# Patient Record
Sex: Male | Born: 1965
Health system: Southern US, Community
[De-identification: ages and names within clinical notes are randomized; demographics above are authoritative.]

## PROBLEM LIST (undated history)

## (undated) DIAGNOSIS — H9190 Unspecified hearing loss, unspecified ear: Secondary | ICD-10-CM

## (undated) DIAGNOSIS — F1010 Alcohol abuse, uncomplicated: Secondary | ICD-10-CM

## (undated) DIAGNOSIS — T7840XA Allergy, unspecified, initial encounter: Secondary | ICD-10-CM

## (undated) DIAGNOSIS — R519 Headache, unspecified: Secondary | ICD-10-CM

## (undated) DIAGNOSIS — M1990 Unspecified osteoarthritis, unspecified site: Secondary | ICD-10-CM

## (undated) DIAGNOSIS — R42 Dizziness and giddiness: Secondary | ICD-10-CM

## (undated) DIAGNOSIS — R51 Headache: Secondary | ICD-10-CM

## (undated) DIAGNOSIS — E785 Hyperlipidemia, unspecified: Secondary | ICD-10-CM

## (undated) HISTORY — DX: Headache: R51

## (undated) HISTORY — PX: SHOULDER SURGERY: SHX246

## (undated) HISTORY — DX: Unspecified osteoarthritis, unspecified site: M19.90

## (undated) HISTORY — PX: COLONOSCOPY: SHX174

## (undated) HISTORY — DX: Allergy, unspecified, initial encounter: T78.40XA

## (undated) HISTORY — DX: Dizziness and giddiness: R42

## (undated) HISTORY — DX: Unspecified hearing loss, unspecified ear: H91.90

## (undated) HISTORY — DX: Hyperlipidemia, unspecified: E78.5

## (undated) HISTORY — PX: INNER EAR SURGERY: SHX679

## (undated) HISTORY — PX: OTHER SURGICAL HISTORY: SHX169

## (undated) HISTORY — DX: Headache, unspecified: R51.9

---

## 2002-02-01 ENCOUNTER — Emergency Department (HOSPITAL_COMMUNITY): Admission: EM | Admit: 2002-02-01 | Discharge: 2002-02-01 | Payer: Self-pay | Admitting: Emergency Medicine

## 2005-04-25 ENCOUNTER — Emergency Department (HOSPITAL_COMMUNITY): Admission: EM | Admit: 2005-04-25 | Discharge: 2005-04-25 | Payer: Self-pay | Admitting: Emergency Medicine

## 2005-04-26 ENCOUNTER — Observation Stay (HOSPITAL_COMMUNITY): Admission: EM | Admit: 2005-04-26 | Discharge: 2005-04-27 | Payer: Self-pay | Admitting: Emergency Medicine

## 2009-07-01 ENCOUNTER — Ambulatory Visit: Payer: Self-pay | Admitting: Cardiology

## 2009-07-01 ENCOUNTER — Observation Stay (HOSPITAL_COMMUNITY): Admission: EM | Admit: 2009-07-01 | Discharge: 2009-07-02 | Payer: Self-pay | Admitting: Emergency Medicine

## 2009-07-12 ENCOUNTER — Encounter: Payer: Self-pay | Admitting: Physician Assistant

## 2009-07-12 ENCOUNTER — Ambulatory Visit: Payer: Self-pay | Admitting: Internal Medicine

## 2009-07-12 DIAGNOSIS — L723 Sebaceous cyst: Secondary | ICD-10-CM | POA: Insufficient documentation

## 2009-07-12 DIAGNOSIS — H6120 Impacted cerumen, unspecified ear: Secondary | ICD-10-CM | POA: Insufficient documentation

## 2009-07-12 DIAGNOSIS — H9190 Unspecified hearing loss, unspecified ear: Secondary | ICD-10-CM | POA: Insufficient documentation

## 2009-07-12 DIAGNOSIS — K219 Gastro-esophageal reflux disease without esophagitis: Secondary | ICD-10-CM | POA: Insufficient documentation

## 2009-08-01 ENCOUNTER — Ambulatory Visit: Payer: Self-pay | Admitting: Physician Assistant

## 2009-08-01 ENCOUNTER — Telehealth: Payer: Self-pay | Admitting: Physician Assistant

## 2009-08-01 DIAGNOSIS — H9209 Otalgia, unspecified ear: Secondary | ICD-10-CM | POA: Insufficient documentation

## 2009-08-18 ENCOUNTER — Ambulatory Visit: Payer: Self-pay | Admitting: Physician Assistant

## 2009-10-31 ENCOUNTER — Ambulatory Visit: Payer: Self-pay | Admitting: Internal Medicine

## 2009-10-31 ENCOUNTER — Ambulatory Visit (HOSPITAL_COMMUNITY): Admission: RE | Admit: 2009-10-31 | Discharge: 2009-10-31 | Payer: Self-pay | Admitting: Internal Medicine

## 2009-10-31 DIAGNOSIS — R1013 Epigastric pain: Secondary | ICD-10-CM | POA: Insufficient documentation

## 2009-10-31 LAB — CONVERTED CEMR LAB
ALT: 9 units/L (ref 0–53)
Amylase: 31 units/L (ref 0–105)
BUN: 12 mg/dL (ref 6–23)
CO2: 22 meq/L (ref 19–32)
Calcium: 9.2 mg/dL (ref 8.4–10.5)
Chloride: 104 meq/L (ref 96–112)
Creatinine, Ser: 0.85 mg/dL (ref 0.40–1.50)
Eosinophils Absolute: 0.1 10*3/uL (ref 0.0–0.7)
Eosinophils Relative: 1 % (ref 0–5)
HCT: 46.5 % (ref 39.0–52.0)
Lymphs Abs: 2.6 10*3/uL (ref 0.7–4.0)
MCV: 93.4 fL (ref 78.0–100.0)
Monocytes Absolute: 0.5 10*3/uL (ref 0.1–1.0)
Monocytes Relative: 8 % (ref 3–12)
RBC: 4.98 M/uL (ref 4.22–5.81)
WBC: 5.8 10*3/uL (ref 4.0–10.5)

## 2009-11-01 ENCOUNTER — Telehealth: Payer: Self-pay | Admitting: Physician Assistant

## 2010-04-17 ENCOUNTER — Emergency Department (HOSPITAL_COMMUNITY)
Admission: EM | Admit: 2010-04-17 | Discharge: 2010-04-17 | Disposition: A | Discharge status: Other Acute Inpatient Hospital | Payer: Self-pay | Source: Home / Self Care | Admitting: Emergency Medicine

## 2010-04-17 ENCOUNTER — Emergency Department (HOSPITAL_COMMUNITY)
Admission: EM | Admit: 2010-04-17 | Discharge: 2010-04-17 | Payer: Self-pay | Source: Home / Self Care | Admitting: Emergency Medicine

## 2010-06-15 NOTE — Assessment & Plan Note (Signed)
Summary: Office Visit:  Ear pain  Nurse Visit   History of Present Illness: Walked into clinic this morning with bilat ear pain.  Has a long h/o ear problems.  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  He is hard of hearing.  Had ears irrigated and wax removed today.  Pain in both ears like he has had in past with "infections."  No fever.  No cough.  Has not tried anything for it.  No improvement with ear irrigation today.   Physical Exam  General:  alert, well-developed, and well-nourished.   Head:  normocephalic and atraumatic.   Eyes:  pupils equal, pupils round, and pupils reactive to light.   Ears:  bilat TM's with obvious perf and chronic scarring no purulent drainage mild irritation of the canals bilat; likely 2/2 ear irrigation today Nose:  no external deformity.   Mouth:  pharynx pink and moist.   Neck:  supple and no cervical lymphadenopathy.   Lungs:  normal breath sounds.   Heart:  normal rate and regular rhythm.     Impression & Recommendations:  Problem # 1:  OTALGIA (ICD-388.70) will give floxin otic to cover for otitis externa return as needed  His updated medication list for this problem includes:    Debrox 6.5 % Soln (Carbamide peroxide) .Marland KitchenMarland KitchenMarland KitchenMarland Kitchen 5 drops each ear two times a day for 4 days  Complete Medication List: 1)  Debrox 6.5 % Soln (Carbamide peroxide) .... 5 drops each ear two times a day for 4 days 2)  Prilosec 20 Mg Cpdr (Omeprazole) .... Take 1 capsule by mouth once a day 3)  Ocuflox 0.3 % Soln (Ofloxacin) .[name].. 10 gtts in both ears once daily for 7 days   Orders Added: 1)  Est. Patient Level III [47829]  Patient Instructions: 1)  Please schedule a follow-up appointment as needed .   Prescriptions: OCUFLOX 0.3 % SOLN (OFLOXACIN) 10 gtts in both ears once daily for 7 days  #1 bottle x 0   Entered and Authorized by:   Tereso Newcomer PA-C   Signed by:   Tereso Newcomer PA-C on 08/01/2009   Method used:   Print then Give to Patient   RxID:   [id number]   Appended Document: Office Visit:  Ear pain    Clinical Lists Changes  Medications: Removed medication of OCUFLOX 0.3 % SOLN (OFLOXACIN) 10 gtts in both ears once daily for 7 days Added new medication of OFLOXACIN 0.3 % SOLN (OFLOXACIN) 10 drops in both ears once daily for 7 days - Signed Rx of OFLOXACIN 0.3 % SOLN (OFLOXACIN) 10 drops in both ears once daily for 7 days;  #1 bottle x 0;  Signed;  Entered by: Tereso Newcomer PA-C;  Authorized by: Tereso Newcomer PA-C;  Method used: Print then Give to Patient    Prescriptions: OFLOXACIN 0.3 % SOLN (OFLOXACIN) 10 drops in both ears once daily for 7 days  #1 bottle x 0   Entered and Authorized by:   Tereso Newcomer PA-C   Signed by:   Tereso Newcomer PA-C on 08/01/2009   Method used:   Print then Give to Patient   RxID:   [id number]

## 2010-06-15 NOTE — Progress Notes (Signed)
Summary: X-rays results  Phone Note Call from Patient Call back at [phone]   Summary of Call: Mr. Vetsch is requesting from his x-rays results.  Please call him back. Avelynn Sellin PA-c Initial call taken by: Manon Hilding,  November 01, 2009 8:12 AM  Follow-up for Phone Call        [place], Looks normal.  Wanted to check with you first since you saw him. Follow-up by: Tereso Newcomer PA-C,  November 01, 2009 1:44 PM  Additional Follow-up for Phone Call Additional follow up Details #1::        Called phone--was his work phone and he is gone for the day.  Unable to reach him via listed home phone--"incorrect code number".   Please call him at [phone] number in morning --ask for Ray--apparently he goes by that--let him know all of his lab work and Xray was normal. See if his pain is improved. Additional Follow-up by: Julieanne Manson MD,  November 01, 2009 6:05 PM    Additional Follow-up for Phone Call Additional follow up Details #2::    pt says he still have pain. [facility] [address]... [place] Shannon  November 02, 2009 9:10 AM

## 2010-06-15 NOTE — Assessment & Plan Note (Signed)
Summary: [facility] pt's / stomach pain//gk   Vital Signs:  Patient profile:   45 year old male Weight:      171 pounds Temp:     97.3 degrees F Pulse rate:   79 / minute Pulse rhythm:   regular Resp:     20 per minute BP sitting:   128 / 77  (left arm) Cuff size:   regular  Vitals Entered By: Vesta Mixer CMA (October 31, 2009 11:53 AM) CC: stomach pain since Saturday.  Is also having almost constant hiccups since then as well Is Patient Diabetic? No Pain Assessment Patient in pain? yes     Location: stomach Intensity: 8  Does patient need assistance? Ambulation Normal   Primary Care Provider:  Tereso Newcomer, PA-C  CC:  stomach pain since Saturday.  Is also having almost constant hiccups since then as well.  History of Present Illness: 1.  Nauseated for a couple of days, then developed epigastric pain, like being pulled apart on the inside.  Some sharp and burning pain.  No radiation of pain to back or elsewhere.  Started hiccuping yesterday, but pt. belched at one point during the interview and stated that was a hiccup.  Bad tast in mouth with the belching.  No fever, diarrhea, constipation, melena or hematochezia.  Did vomit 3-4 times yesterday.  Has not eaten for 2 days.  Has been drinking [place] Sweet Tea and water.  Has been urinating okay--urine a bit darker today.  Has not had any alcohol since hospitalized in February with chest pain--felt to have GERD or possibly gastritis then.  Does smoke 1 ppd.  Cannot say he ate anything differently before symptoms occured--no increased chocolate, onions, tomatoes, caffeine, etc.  Pt. was switched to H2 blocker in April, but pt. went back to using OMeprazole as did not control reflux symptoms--felt better for a while until 2 days ago.    Allergies (verified): 1)  ! Codeine  Physical Exam  General:  Fatigued appearing. Lungs:  Normal respiratory effort, chest expands symmetrically. Lungs are clear to auscultation, no crackles or  wheezes. Heart:  Normal rate and regular rhythm. S1 and S2 normal without gallop, murmur, click, rub or other extra sounds. Abdomen:  soft.  Tender without peritoneal signs in epigastrium and around umbilicus as well as slight tenderness in RLQ--the latter seemed to cause him more discomfort, however in epigastrium.  No rebound.  No HSM or mass.   Impression & Recommendations:  Problem # 1:  EPIGASTRIC PAIN (ICD-789.06) Clear liquids, no caffeine, no NSAIDS Increase Omeprazole to 40 mg two times a day  Phenergain for nausea as needed   Orders: T-Comprehensive Metabolic Panel (04540-98119) T-CBC w/Diff (14782-95621) TLB-H. Pylori Abs(Helicobacter Pylori) (86677-HELICO) T-Amylase (30865-78469) T-Lipase (62952-84132) Diagnostic X-Ray/Fluoroscopy (Diagnostic X-Ray/Flu)--Acute abdominal series  Complete Medication List: 1)  Debrox 6.5 % Soln (Carbamide peroxide) .... 5 drops each ear two times a day for 4 days 2)  Omeprazole 40 Mg Cpdr (Omeprazole) .[name].. 1 cap by mouth two times a day on empty stomach 3)  Ofloxacin 0.3 % Soln (Ofloxacin) .[name].. 10 drops in both ears once daily for 7 days 4)  Promethazine Hcl 25 Mg Tabs (Promethazine hcl) .[name].. 1 tab by mouth every 6 hours as needed for nausea and vomiting  Patient Instructions: 1)  Will call with results Prescriptions: PROMETHAZINE HCL 25 MG TABS (PROMETHAZINE HCL) 1 tab by mouth every 6 hours as needed for nausea and vomiting  #10 x 0   Entered and  Authorized by:   Julieanne Manson MD   Signed by:   Julieanne Manson MD on 10/31/2009   Method used:   Electronically to        [facility] (retail)       [address]       [place], [place]  [id number]       Ph: [phone]       Fax: [phone]   RxID:   [id number] OMEPRAZOLE 40 MG CPDR (OMEPRAZOLE) 1 cap by mouth two times a day on empty stomach  #60 x 4   Entered and Authorized by:   Julieanne Manson MD   Signed by:   Julieanne Manson MD on  10/31/2009   Method used:   Electronically to        [facility] (retail)       [address]       [place], [place]  [id number]       Ph: [phone]       Fax: [phone]   RxID:   [id number]

## 2010-06-15 NOTE — Progress Notes (Signed)
Summary: pt wants to be seen.  Phone Note Call from Patient Call back at Home Phone [phone]   Summary of Call: Mr. Kenneth Gilbert has pain in both ears which unable for him to hear correctly and he wants to be seen promptly.  Pt is at the lobby at this moment. Alben Spittle PA-c  Initial call taken by: Manon Hilding,  August 01, 2009 9:54 AM  Follow-up for Phone Call        pt says he would like some ear med because his ear hurts.... pt says this started a couple of days ago.... pt says it is both ears... he says this must has happen since he was out in the wind... Follow-up by: [place] Shannon,  August 01, 2009 9:59 AM  Additional Follow-up for Phone Call Additional follow up Details #1::        Is he coming in for nurse visit? Additional Follow-up by: Tereso Newcomer PA-C,  August 01, 2009 1:37 PM    Additional Follow-up for Phone Call Additional follow up Details #2::    pt is here for nurse visit Follow-up by: [place] Shannon,  August 01, 2009 4:04 PM

## 2010-06-15 NOTE — Assessment & Plan Note (Signed)
Summary: XFU-CHEST PAIN//DS   Vital Signs:  Patient profile:   45 year old male Height:      70 inches Weight:      175 pounds BMI:     25.20 Temp:     98.0 degrees F oral Pulse rate:   74 / minute Pulse rhythm:   regular Resp:     18 per minute BP sitting:   120 / 64  (left arm) Cuff size:   regular  Vitals Entered By: [place] Shannon (July 12, 2009 11:51 AM) CC: xfu.... chest pains..... Is Patient Diabetic? No Pain Assessment Patient in pain? no       Does patient need assistance? Functional Status Self care Ambulation Normal   CC:  xfu.... chest pains......  History of Present Illness: New patient.  Post hosp f/u.  Had chest pain.  Ruled out.  ETT was neg.  No further w/u recommended.  Placed on PPI for probable GERD causing symptoms. Patient denies having any other medical care in recent past. He was asked to take prilosec which he got over the counter.  His heartburn is much better.  Symptoms have dramatically improved since he started the med.  He still feels some substernal and left chest discomfort at times.  Symptoms all related to meals.  No dysphagia.  No melena or hematochezia.  No vomiting or hematemesis.  Labs in the hospital ok.  No anemia.  No weight loss.    Habits & Providers  Alcohol-Tobacco-Diet     Tobacco Status: quit  Exercise-Depression-Behavior     Have you felt down or hopeless? yes     Have you felt little pleasure in things? no  Past History:  Past Medical History: Hard of Hearing GERD past cocaine use High Cholesterol in past  Past Surgical History: s/p right and left ulnar nerve release at [facility] 1999 s/p left shoulder surgery at [facility] 1999 s/p ear surgery 1967  Family History: CAD - s/p CABG (?age); Bro with MI/CVA Cancer - Dad (? colon) Cirrhosis - sister Family History Diabetes 1st degree relative - brother  Social History: Alcohol use-yes    a. states he quit when he went to the hospital 06/2009   b.  previously drinking  every day; 1/5 liquor per day during week and 1/2 gallon on weekends   c.  says he was drinking a lot due to increased stress at home Past cocaine use (none in 2 years) Admits to [facility] Former Smoker (20 pack years)   a.  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 CV:  Denies difficulty breathing at night, difficulty breathing while lying down, fainting, and near fainting. Resp:  Denies cough. GI:  Denies bloody stools and dark tarry stools. GU:  Denies hematuria. Psych:  Denies suicidal thoughts/plans.  Physical Exam  General:  alert, well-developed, and well-nourished.   Head:  normocephalic and atraumatic.   Eyes:  pupils equal, pupils round, and pupils reactive to light.   Ears:  R cerumen impaction, R decreased hearing, L Cerumen impaction, and L decreased hearing.   Nose:  no external deformity.   Mouth:  pharynx pink and moist.   Neck:  supple, no carotid bruits, and no cervical lymphadenopathy.   Lungs:  normal breath sounds, no crackles, and no wheezes.   Heart:  normal rate and regular rhythm.  Abdomen:  soft, non-tender, and no hepatomegaly.   Extremities:  no edema Neurologic:  alert & oriented X3 and cranial nerves II-XII intact.   Psych:  normally interactive, not anxious appearing, and not depressed appearing.     Impression & Recommendations:  Problem # 1:  GERD (ICD-530.81)  continue PPI f/u in one month try to get on H2RA at that time may need upper GI or referral to GI if symptoms persisit  His updated medication list for this problem includes:    Prilosec 20 Mg Cpdr (Omeprazole) .[name]... Take 1 capsule by mouth once a day  Problem # 2:  SEBACEOUS CYST, NECK (ICD-706.2) will see if Dr. Delrae Alfred can remove Dr. Delrae Alfred inspected and cannot remove if he wants removed will have to send to surgery or derm  Problem # 3:   Preventive Health Care (ICD-V70.0)  pt refused flu shot in hosp CMET, CBC ok in hosp Lipids:  HDL 33 and LDL 129 UDS with + THC in hosp check HIV today schedule CPE in future  Orders: T-HIV Antibody  (Reflex) (04540-98119)  Problem # 4:  IMPACTED CERUMEN (ICD-380.4) rx debrox if still there at f/u . [name] . irrigate  Complete Medication List: 1)  Debrox 6.5 % Soln (Carbamide peroxide) .... 5 drops each ear two times a day for 4 days 2)  Prilosec 20 Mg Cpdr (Omeprazole) .... Take 1 capsule by mouth once a day  Patient Instructions: 1)  Please schedule a follow-up appointment in 1 month with [facility] for your stomach. 2)  Keep taking the medicine every day until your follow up appt. 3)  The generic name is omeprazole and you can take 20 mg once daily.  Prescriptions: DEBROX 6.5 % SOLN (CARBAMIDE PEROXIDE) 5 drops each ear two times a day for 4 days  #1 bottle x 0   Entered and Authorized by:   Tereso Newcomer PA-C   Signed by:   Tereso Newcomer PA-C on 07/12/2009   Method used:   Print then Give to Patient   RxID:   [id number]    CXR  Procedure date:  06/30/2009  Findings:      Findings:  The heart size and mediastinal contours are within   normal limits.  Both lungs are clear.  The visualized skeletal   structures are unremarkable.    IMPRESSION:   No active cardiopulmonary disease.    Laboratory Results    Other Tests  Rapid HIV: negative

## 2010-06-15 NOTE — Assessment & Plan Note (Signed)
Summary: f/u with Kenneth Gilbert in one month stomach //gk   Vital Signs:  Patient profile:   45 year old male Weight:      178.50 pounds BMI:     25.70 Temp:     98.2 degrees F Pulse rate:   79 / minute Resp:     24 per minute BP sitting:   118 / 68  (left arm) Cuff size:   regular  Vitals Entered By: Chauncy Passy, SMA CC: Pt. is here for a 1 month f/u on his stomach. Pt. is still complaining of upper abd. pain.  Is Patient Diabetic? No Pain Assessment Patient in pain? no       Does patient need assistance? Functional Status Self care Ambulation Normal   Primary Care Provider:  Tereso Newcomer, PA-C  CC:  Pt. is here for a 1 month f/u on his stomach. Pt. is still complaining of upper abd. pain. [name]  History of Present Illness: Here for f/u as above.  His symptoms have greatly improved.  No significant chest pain.  He has only occ symptoms when he eats the wrong things.  No melena, hematochezia or dysphagia.    Current Medications (verified): 1)  Debrox 6.5 % Soln (Carbamide Peroxide) .... 5 Drops Each Ear Two Times A Day For 4 Days 2)  Prilosec 20 Mg Cpdr (Omeprazole) .... Take 1 Capsule By Mouth Once A Day 3)  Ofloxacin 0.3 % Soln (Ofloxacin) .[name].. 10 Drops in Both Ears Once Daily For 7 Days  Allergies (verified): 1)  ! Codeine  Physical Exam  General:  alert, well-developed, and well-nourished.   Head:  normocephalic and atraumatic.   Lungs:  normal breath sounds.   Heart:  normal rate and regular rhythm.   Abdomen:  soft and non-tender.   Neurologic:  alert & oriented X3 and cranial nerves II-XII intact.   Psych:  normally interactive.     Impression & Recommendations:  Problem # 1:  GERD (ICD-530.81) try to taper off prilosec and put on pepcid  His updated medication list for this problem includes:    Prilosec 20 Mg Cpdr (Omeprazole) .[name]... Take 1 capsule by mouth once a day    Pepcid 20 Mg Tabs (Famotidine) .[name]... Take 1 tablet by mouth two times a day  Problem #  2:  PREVENTIVE HEALTH CARE (ICD-V70.0) schedule cpe get lipds then (hdl was low but patient recently stopped drinking alcohol)  Complete Medication List: 1)  Debrox 6.5 % Soln (Carbamide peroxide) .... 5 drops each ear two times a day for 4 days 2)  Prilosec 20 Mg Cpdr (Omeprazole) .... Take 1 capsule by mouth once a day 3)  Ofloxacin 0.3 % Soln (Ofloxacin) .[name].. 10 drops in both ears once daily for 7 days 4)  Pepcid 20 Mg Tabs (Famotidine) .... Take 1 tablet by mouth two times a day  Patient Instructions: 1)  Start taking the pepcid when you get it. 2)  Take the prilosec every other day for 3 doses, then stop. 3)  Take the pepcid two times a day for 4 weeks. 4)  Then take the pepcid two times a day as needed. 5)  If you need to take pepcid every day, keep taking it. 6)  If you realize that you felt better on the prilosec, go back to the Prilosec and let me know. 7)  Please schedule a follow-up appointment in 4 months with Pasqualina Colasurdo for CPE.  Come fasting (nothing to eat or drink after midnight  except water) for blood work. 8)    Prescriptions: PEPCID 20 MG TABS (FAMOTIDINE) Take 1 tablet by mouth two times a day  #60 x 5   Entered and Authorized by:   Kenneth Newcomer PA-C   Signed by:   Kenneth Newcomer PA-C on 08/18/2009   Method used:   Print then Give to Patient   RxID:   [id number]

## 2010-06-15 NOTE — Letter (Signed)
Summary: PT INFORMATION SHEET  PT INFORMATION SHEET   Imported By: Arta Bruce 09/07/2009 15:54:44  _____________________________________________________________________  External Attachment:    Type:   Image     Comment:   External Document

## 2010-07-24 LAB — BASIC METABOLIC PANEL
CO2: 26 mEq/L (ref 19–32)
Chloride: 105 mEq/L (ref 96–112)
Glucose, Bld: 115 mg/dL — ABNORMAL HIGH (ref 70–99)
Potassium: 4 mEq/L (ref 3.5–5.1)
Sodium: 140 mEq/L (ref 135–145)

## 2010-07-24 LAB — URINALYSIS, ROUTINE W REFLEX MICROSCOPIC
Bilirubin Urine: NEGATIVE
Protein, ur: 30 mg/dL — AB
Urobilinogen, UA: 0.2 mg/dL (ref 0.0–1.0)

## 2010-07-24 LAB — CBC
HCT: 44.5 % (ref 39.0–52.0)
Hemoglobin: 15.3 g/dL (ref 13.0–17.0)
MCH: 32.1 pg (ref 26.0–34.0)
MCHC: 34.4 g/dL (ref 30.0–36.0)
MCV: 93.3 fL (ref 78.0–100.0)

## 2010-07-24 LAB — DIFFERENTIAL
Basophils Relative: 0 % (ref 0–1)
Eosinophils Absolute: 0.1 10*3/uL (ref 0.0–0.7)
Monocytes Absolute: 0.5 10*3/uL (ref 0.1–1.0)
Monocytes Relative: 5 % (ref 3–12)

## 2010-07-24 LAB — POCT URINALYSIS DIPSTICK
Glucose, UA: NEGATIVE mg/dL
Nitrite: NEGATIVE
Urobilinogen, UA: 0.2 mg/dL (ref 0.0–1.0)

## 2010-07-24 LAB — URINE MICROSCOPIC-ADD ON

## 2010-08-02 LAB — DIFFERENTIAL
Basophils Absolute: 0 10*3/uL (ref 0.0–0.1)
Basophils Relative: 0 % (ref 0–1)
Eosinophils Absolute: 0.1 10*3/uL (ref 0.0–0.7)
Eosinophils Relative: 1 % (ref 0–5)
Lymphocytes Relative: 52 % — ABNORMAL HIGH (ref 12–46)
Monocytes Absolute: 0.4 10*3/uL (ref 0.1–1.0)

## 2010-08-02 LAB — CK TOTAL AND CKMB (NOT AT ARMC): CK, MB: 1.3 ng/mL (ref 0.3–4.0)

## 2010-08-02 LAB — LIPID PANEL
Cholesterol: 179 mg/dL (ref 0–200)
HDL: 33 mg/dL — ABNORMAL LOW (ref >39)
LDL Cholesterol: 129 mg/dL — ABNORMAL HIGH (ref 0–99)
Total CHOL/HDL Ratio: 5.4 RATIO
Triglycerides: 87 mg/dL (ref <150)
VLDL: 17 mg/dL (ref 0–40)

## 2010-08-02 LAB — URINE DRUGS OF ABUSE SCREEN W ALC, ROUTINE (REF LAB)
Amphetamine Screen, Ur: NEGATIVE
Benzodiazepines.: NEGATIVE
Cocaine Metabolites: NEGATIVE
Creatinine,U: 151.8 mg/dL
Ethyl Alcohol: <10 mg/dL (ref <10)
Opiate Screen, Urine: NEGATIVE
Propoxyphene: NEGATIVE

## 2010-08-02 LAB — TSH: TSH: 0.874 u[IU]/mL (ref 0.350–4.500)

## 2010-08-02 LAB — BASIC METABOLIC PANEL
BUN: 10 mg/dL (ref 6–23)
Calcium: 8.2 mg/dL — ABNORMAL LOW (ref 8.4–10.5)
GFR calc non Af Amer: >60 mL/min (ref >60)
Glucose, Bld: 93 mg/dL (ref 70–99)
Potassium: 4.1 mEq/L (ref 3.5–5.1)
Sodium: 138 mEq/L (ref 135–145)

## 2010-08-02 LAB — COMPREHENSIVE METABOLIC PANEL
AST: 17 U/L (ref 0–37)
Alkaline Phosphatase: 77 U/L (ref 39–117)
CO2: 26 mEq/L (ref 19–32)
Chloride: 107 mEq/L (ref 96–112)
Creatinine, Ser: 0.81 mg/dL (ref 0.4–1.5)
GFR calc Af Amer: >60 mL/min (ref >60)
GFR calc non Af Amer: >60 mL/min (ref >60)
Potassium: 3.7 mEq/L (ref 3.5–5.1)
Total Bilirubin: 0.5 mg/dL (ref 0.3–1.2)

## 2010-08-02 LAB — CARDIAC PANEL(CRET KIN+CKTOT+MB+TROPI)
Relative Index: INVALID (ref 0.0–2.5)
Total CK: 68 U/L (ref 7–232)
Troponin I: <0.01 ng/mL (ref 0.00–0.06)

## 2010-08-02 LAB — THC (MARIJUANA), URINE, CONFIRMATION: Marijuana, Ur-Confirmation: 259 NG/ML — ABNORMAL HIGH

## 2010-08-02 LAB — ETHANOL: Alcohol, Ethyl (B): <5 mg/dL (ref 0–10)

## 2010-08-02 LAB — APTT: aPTT: 31 seconds (ref 24–37)

## 2010-08-02 LAB — PROTIME-INR
INR: 0.97 (ref 0.00–1.49)
Prothrombin Time: 12.8 seconds (ref 11.6–15.2)

## 2010-08-02 LAB — CBC
HCT: 46.7 % (ref 39.0–52.0)
MCV: 96.8 fL (ref 78.0–100.0)
RBC: 4.82 MIL/uL (ref 4.22–5.81)
WBC: 6.9 10*3/uL (ref 4.0–10.5)

## 2010-08-02 LAB — LIPASE, BLOOD: Lipase: 57 U/L (ref 11–59)

## 2010-09-29 NOTE — Discharge Summary (Signed)
NAME:  Kenneth Gilbert, Kenneth Gilbert              ACCOUNT NO.:  [id number]   MEDICAL RECORD NO.:  [id number]          PATIENT TYPE:  OBV   LOCATION:  3738                         FACILITY:  [facility]   PHYSICIAN:  Theone Stanley, MD   DATE OF BIRTH:  26-Mar-1966   DATE OF ADMISSION:  04/26/2005  DATE OF DISCHARGE:  04/27/2005                                 DISCHARGE SUMMARY   ADMISSION DIAGNOSES:  1.  Atypical chest pain.  2.  Right neck mass.   DISCHARGE DIAGNOSES:  1.  Atypical chest pain, most likely gastrointestinal etiology.  2.  Right neck mass consistent with a sebaceous cyst.   CONSULTATIONS:  None.   PROCEDURES/DIAGNOSTIC TESTS:  1.  The patient had a chest x-ray performed on December 13 which was      negative.  2.  He had an echocardiogram performed on December 15 which is pending.   HOSPITAL COURSE:  [name] is a 45 year old gentleman presenting with  complaints of intermittent chest pain.  He was admitted to rule out MI to  telemetry.  He was normal sinus rhythm without acute issues.  His cardiac  enzymes were negative.  There was no evidence of any abnormalities on his  EKG.  A D-dimer was less than 0.22.  After speaking with him this appears to  be more associated with food.  In addition his urine was positive for  cocaine, marijuana, and opiates.  I discussed the situation with the patient  and indicated that he needs to stop his drug use because it can cause chest  pain and in addition because his symptoms are associated with oral intake,  he will need to be followed up with a gastroenterologist.  In the meantime  he can start Prevacid OTC to see if this might help with this pain.  In  regards to his neck mass, this started about four years ago, has not  increased in size.  It is soft to the touch and is most consistent with a  sebaceous cyst.  However, I did indicate to him that he needs to follow up  with his primary care physician.  Currently the patient has no primary  care  physician, and I have asked case management to refer him to [facility].  In  addition a social work consult was obtained for substance abuse and smoking  cessation which was given to the patient.  The patient left the hospital in  stable condition.   MEDICATIONS:  The patient was instructed to get Prilosec over the counter,  take it daily, to see if it might help with the symptoms.   FOLLOW UP:  The patient was to follow up with [facility] in 1-2 weeks.      Theone Stanley, MD  Electronically Signed    AEJ/MEDQ  D:  04/27/2005  T:  04/30/2005  Job:  [id number]

## 2010-11-26 ENCOUNTER — Ambulatory Visit (INDEPENDENT_AMBULATORY_CARE_PROVIDER_SITE_OTHER): Payer: Self-pay

## 2010-11-26 ENCOUNTER — Inpatient Hospital Stay (INDEPENDENT_AMBULATORY_CARE_PROVIDER_SITE_OTHER)
Admission: RE | Admit: 2010-11-26 | Discharge: 2010-11-26 | Disposition: A | Discharge status: Home / Self Care | Payer: Self-pay | Source: Ambulatory Visit | Attending: Family Medicine | Admitting: Family Medicine

## 2010-11-26 ENCOUNTER — Encounter (HOSPITAL_COMMUNITY): Payer: Self-pay

## 2010-11-26 DIAGNOSIS — S6000XA Contusion of unspecified finger without damage to nail, initial encounter: Secondary | ICD-10-CM

## 2012-02-05 ENCOUNTER — Encounter (HOSPITAL_COMMUNITY): Payer: Self-pay | Admitting: *Deleted

## 2012-02-05 ENCOUNTER — Emergency Department (HOSPITAL_COMMUNITY)
Admission: EM | Admit: 2012-02-05 | Discharge: 2012-02-06 | Disposition: A | Discharge status: Home / Self Care | Payer: Self-pay | Attending: Emergency Medicine | Admitting: Emergency Medicine

## 2012-02-05 DIAGNOSIS — K2970 Gastritis, unspecified, without bleeding: Secondary | ICD-10-CM | POA: Insufficient documentation

## 2012-02-05 DIAGNOSIS — Z885 Allergy status to narcotic agent status: Secondary | ICD-10-CM | POA: Insufficient documentation

## 2012-02-05 DIAGNOSIS — K2990 Gastroduodenitis, unspecified, without bleeding: Secondary | ICD-10-CM | POA: Insufficient documentation

## 2012-02-05 DIAGNOSIS — F17200 Nicotine dependence, unspecified, uncomplicated: Secondary | ICD-10-CM | POA: Insufficient documentation

## 2012-02-05 DIAGNOSIS — R109 Unspecified abdominal pain: Secondary | ICD-10-CM | POA: Insufficient documentation

## 2012-02-05 HISTORY — DX: Alcohol abuse, uncomplicated: F10.10

## 2012-02-05 LAB — CBC
HCT: 41.4 % (ref 39.0–52.0)
MCHC: 35.3 g/dL (ref 30.0–36.0)
RDW: 12.5 % (ref 11.5–15.5)
WBC: 6.8 10*3/uL (ref 4.0–10.5)

## 2012-02-05 LAB — COMPREHENSIVE METABOLIC PANEL
ALT: 16 U/L (ref 0–53)
AST: 16 U/L (ref 0–37)
Albumin: 3.9 g/dL (ref 3.5–5.2)
Alkaline Phosphatase: 71 U/L (ref 39–117)
Chloride: 107 mEq/L (ref 96–112)
Potassium: 3.9 mEq/L (ref 3.5–5.1)
Sodium: 143 mEq/L (ref 135–145)
Total Bilirubin: 0.4 mg/dL (ref 0.3–1.2)
Total Protein: 6.7 g/dL (ref 6.0–8.3)

## 2012-02-05 LAB — URINALYSIS, ROUTINE W REFLEX MICROSCOPIC
Bilirubin Urine: NEGATIVE
Glucose, UA: NEGATIVE mg/dL
Hgb urine dipstick: NEGATIVE
Ketones, ur: NEGATIVE mg/dL
Leukocytes, UA: NEGATIVE
Protein, ur: NEGATIVE mg/dL
pH: 6 (ref 5.0–8.0)

## 2012-02-05 NOTE — ED Notes (Signed)
Presents with c/o diffuse abd pain xx1 - worse after eating; also endorses interm nausea; previous hx of same x2 years ago - was seen for same - states was related to excessive ETOH  Intake at that time; states last ETOH intake x1 week ago (beer) to "cflush my kidneys out"; states this precipitated abd pain then as well; states hematuria x2 episodes last week which resolved on own; states hx of hematuria which he reports was r/t kidney stone

## 2012-02-05 NOTE — ED Notes (Signed)
Patient with abdominal pain on Thursday after he ate dinner.  States that he became dizzy laid down and he felt better afterwards.  Patient noticed blood in his urine on Friday.  States that his abdomen is still hurting.  Patient has history of etoh abuse.

## 2012-02-06 ENCOUNTER — Emergency Department (HOSPITAL_COMMUNITY): Payer: Self-pay

## 2012-02-06 MED ORDER — FAMOTIDINE 20 MG PO TABS: 20.0000 mg | ORAL_TABLET | Freq: Two times a day (BID) | ORAL | Status: DC

## 2012-02-06 MED ORDER — GI COCKTAIL ~~LOC~~
30.0000 mL | Freq: Once | ORAL | Status: AC
  Administered 2012-02-06: 30 mL via ORAL
  Filled 2012-02-06: qty 30

## 2012-02-06 MED ORDER — LANSOPRAZOLE 30 MG PO CPDR: 30.0000 mg | DELAYED_RELEASE_CAPSULE | Freq: Every day | ORAL | Status: DC

## 2012-02-06 NOTE — ED Provider Notes (Signed)
History     CSN: [id number]  Arrival date & time 02/05/12  1816   First MD Initiated Contact with Patient 02/06/12 0017      Chief Complaint  Patient presents with  . Abdominal Pain    (Consider location/radiation/quality/duration/timing/severity/associated sxs/prior treatment) HPI Comments: 46 year old male who has a history of heavy alcohol use and gastritis per his report he presents with a complaint of abdominal pain. The pain is in the upper abdomen, epigastrium, non-radiating, intense at times after eating and then fades with time. Currently his pain is 6/10, not associated with nausea vomiting or changes in his stools. He denies any fevers chills coughing shortness of breath back pain chest pain headache blurred vision numbness weakness tingling rashes or swelling. He has been treated with Prilosec in the past but denies taking this medication in some time. He states that he last drank about one week ago to help his "kidneys flushed out."  He also admits to using frequent aspirin and ibuprofen for minor aches and pains.  Patient is a 46 y.o. male presenting with abdominal pain. The history is provided by the patient.  Abdominal Pain The primary symptoms of the illness include abdominal pain.    Past Medical History  Diagnosis Date  . ETOH abuse     History reviewed. No pertinent past surgical history.  No family history on file.  History  Substance Use Topics  . Smoking status: Current Every Day Smoker -- 0.5 packs/day    Types: Cigarettes  . Smokeless tobacco: Not on file  . Alcohol Use: Yes      Review of Systems  Gastrointestinal: Positive for abdominal pain.  All other systems reviewed and are negative.    Allergies  Codeine  Home Medications   Current Outpatient Rx  Name Route Sig Dispense Refill  . FAMOTIDINE 20 MG PO TABS Oral Take 1 tablet (20 mg total) by mouth 2 (two) times daily. 30 tablet 3  . LANSOPRAZOLE 30 MG PO CPDR Oral Take 1 capsule (30  mg total) by mouth daily. 30 capsule 3    BP 107/56  Pulse 75  Temp 98.1 F (36.7 C) (Oral)  Resp 16  SpO2 98%  Physical Exam  Nursing note and vitals reviewed. Constitutional: He appears well-developed and well-nourished. No distress.  HENT:  Head: Normocephalic and atraumatic.  Mouth/Throat: Oropharynx is clear and moist. No oropharyngeal exudate.  Eyes: Conjunctivae normal and EOM are normal. Pupils are equal, round, and reactive to light. Right eye exhibits no discharge. Left eye exhibits no discharge. No scleral icterus.  Neck: Normal range of motion. Neck supple. No JVD present. No thyromegaly present.  Cardiovascular: Normal rate, regular rhythm, normal heart sounds and intact distal pulses.  Exam reveals no gallop and no friction rub.   No murmur heard. Pulmonary/Chest: Effort normal and breath sounds normal. No respiratory distress. He has no wheezes. He has no rales.  Abdominal: Soft. Bowel sounds are normal. He exhibits no distension and no mass. There is tenderness ( Mild tenderness in the epigastrium right upper quadrant and left upper quadrant).       No guarding or peritoneal signs, no lower abdominal tenderness  Musculoskeletal: Normal range of motion. He exhibits no edema and no tenderness.  Lymphadenopathy:    He has no cervical adenopathy.  Neurological: He is alert. Coordination normal.  Skin: Skin is warm and dry. No rash noted. No erythema.  Psychiatric: He has a normal mood and affect. His behavior is normal.  ED Course  Procedures (including critical care time)   Labs Reviewed  CBC  COMPREHENSIVE METABOLIC PANEL  URINALYSIS, ROUTINE W REFLEX MICROSCOPIC  LIPASE, BLOOD   US Abdomen Complete  02/06/2012  *RADIOLOGY REPORT*  Clinical Data:  46 year old male with abdominal pain.  COMPLETE ABDOMINAL ULTRASOUND  Comparison:  CT abdomen pelvis without contrast 04/17/2010.  Findings:  Gallbladder:  Contracted, which may explain the appearance of gallbladder  wall thickening up to 3 mm.  No echogenic sludge or stones identified.  No sonographic Murphy's sign elicited.  No pericholecystic fluid.  Common bile duct:  Within normal limits up to 4 mm diameter.  Liver:  No focal lesion identified.  Within normal limits in parenchymal echogenicity.  IVC:  Appears normal.  Pancreas:  No focal abnormality seen.  Spleen:  Normal measuring 5.4 cm in length.  Right Kidney:  Normal measuring up to 11.3 cm in length.  Left Kidney:  Normal measuring 11.8 cm in length.  Abdominal aorta:  No aneurysm identified.  IMPRESSION: Contracted gallbladder, but no cholelithiasis or strong sonographic evidence of acute cholecystitis.   Original Report Authenticated By: Harley Hallmark, M.D.      1. Gastritis   2. Abdominal pain       MDM  The patient is overall well appearing with normal vital signs and mild tenderness in the upper abdomen. There is no Murphy sign, no peritoneal signs and no pain in the lower abdomen. Would consider gastritis, pancreatitis or cholecystitis, ultrasound and lipase pending, liver function tests and renal function is normal, blood counts are normal.   The patient is aware of his results, there is no acute findings to suggest an alternate source of his pain and his pain improved with a GI cocktail thus I think that this is probably gastritis, I have encouraged him to stop using over-the-counter medications other than Tylenol and to abstain from alcohol use, patient has agreed to followup in use antacids as indicated.     Vida Roller, MD 02/06/12 Earle Gell

## 2012-03-06 ENCOUNTER — Encounter: Payer: Self-pay | Admitting: Gastroenterology

## 2012-03-31 ENCOUNTER — Ambulatory Visit: Payer: Self-pay | Admitting: Gastroenterology

## 2013-01-28 ENCOUNTER — Encounter (HOSPITAL_COMMUNITY): Payer: Self-pay | Admitting: *Deleted

## 2013-01-28 ENCOUNTER — Emergency Department (HOSPITAL_COMMUNITY)
Admission: EM | Admit: 2013-01-28 | Discharge: 2013-01-28 | Disposition: A | Discharge status: Home / Self Care | Payer: Self-pay | Attending: Emergency Medicine | Admitting: Emergency Medicine

## 2013-01-28 DIAGNOSIS — Z87442 Personal history of urinary calculi: Secondary | ICD-10-CM | POA: Insufficient documentation

## 2013-01-28 DIAGNOSIS — F17200 Nicotine dependence, unspecified, uncomplicated: Secondary | ICD-10-CM | POA: Insufficient documentation

## 2013-01-28 DIAGNOSIS — R319 Hematuria, unspecified: Secondary | ICD-10-CM | POA: Insufficient documentation

## 2013-01-28 LAB — URINE MICROSCOPIC-ADD ON

## 2013-01-28 LAB — URINALYSIS, ROUTINE W REFLEX MICROSCOPIC
Glucose, UA: NEGATIVE mg/dL
Specific Gravity, Urine: 1.012 (ref 1.005–1.030)
Urobilinogen, UA: 1 mg/dL (ref 0.0–1.0)
pH: 6.5 (ref 5.0–8.0)

## 2013-01-28 NOTE — ED Notes (Signed)
Pt presents to department for evaluation of hematuria. States he noticed blood in urine this morning. Denies abdominal pain. Denies penile discharge. No nausea/vomiting. Abdomen soft and non tender to palpation. Bowel sounds present. No signs of distress noted at the time.

## 2013-01-28 NOTE — ED Provider Notes (Signed)
CSN: [id number]     Arrival date & time 01/28/13  1142 History   First MD Initiated Contact with Patient 01/28/13 1200     Chief Complaint  Patient presents with  . Hematuria   (Consider location/radiation/quality/duration/timing/severity/associated sxs/prior Treatment) HPI Comments: 47 yo male with kidney stone and etoh hx presents with hematuria, dark, through entire stream for one day.  Possibly had hematuria with stone in the past.  No pain, fevers or dysuria.  No prostate or kidney issues.  NO FH kidney or bladder CA.  Smoker for years.  No current urologist.    Patient is a 47 y.o. male presenting with hematuria. The history is provided by the patient.  Hematuria This is a new problem. Pertinent negatives include no chest pain, no abdominal pain, no headaches and no shortness of breath.    Past Medical History  Diagnosis Date  . ETOH abuse    History reviewed. No pertinent past surgical history. History reviewed. No pertinent family history. History  Substance Use Topics  . Smoking status: Current Every Day Smoker -- 0.50 packs/day    Types: Cigarettes  . Smokeless tobacco: Not on file  . Alcohol Use: Yes    Review of Systems  Constitutional: Negative for fever and chills.  HENT: Negative for neck pain and neck stiffness.   Eyes: Negative for visual disturbance.  Respiratory: Negative for shortness of breath.   Cardiovascular: Negative for chest pain.  Gastrointestinal: Negative for vomiting and abdominal pain.  Genitourinary: Positive for hematuria. Negative for dysuria and flank pain.  Musculoskeletal: Negative for back pain.  Skin: Negative for rash.  Neurological: Negative for light-headedness and headaches.    Allergies  Codeine  Home Medications   Current Outpatient Rx  Name  Route  Sig  Dispense  Refill  . ibuprofen (ADVIL,MOTRIN) 200 MG tablet   Oral   Take 400 mg by mouth every 6 (six) hours as needed for pain or headache.          BP 108/60   Pulse 93  Temp(Src) 98.2 F (36.8 C) (Oral)  Resp 18  Ht 6\' 2"  (1.88 m)  Wt 178 lb (80.74 kg)  BMI 22.84 kg/m2  SpO2 95% Physical Exam  Nursing note and vitals reviewed. Constitutional: He is oriented to person, place, and time. He appears well-developed and well-nourished.  HENT:  Head: Normocephalic and atraumatic.  Eyes: Conjunctivae are normal. Right eye exhibits no discharge. Left eye exhibits no discharge.  Neck: Normal range of motion. Neck supple. No tracheal deviation present.  Cardiovascular: Normal rate and regular rhythm.   Pulmonary/Chest: Effort normal and breath sounds normal.  Abdominal: Soft. He exhibits no distension. There is no tenderness. There is no guarding.  Musculoskeletal: He exhibits no edema.  Neurological: He is alert and oriented to person, place, and time.  Skin: Skin is warm. No rash noted.  Psychiatric: He has a normal mood and affect.    ED Course  Procedures (including critical care time) Emergency Focused Ultrasound Exam Limited retroperitoneal ultrasound of kidneys  Performed and interpreted by Dr. Jodi Mourning Indication: hematuria, RUQ pain recently Focused abdominal ultrasound with both kidneys imaged in transverse and longitudinal planes in real-time. Interpretation: no hydronephrosis visualized.  no stones or cysts visualized  Images archived electronically  Labs Review Labs Reviewed  URINALYSIS, ROUTINE W REFLEX MICROSCOPIC - Abnormal; Notable for the following:    Color, Urine RED (*)    APPearance TURBID (*)    Hgb urine dipstick LARGE (*)  Ketones, ur 15 (*)    Leukocytes, UA TRACE (*)    All other components within normal limits  URINE MICROSCOPIC-ADD ON - Abnormal; Notable for the following:    Squamous Epithelial / LPF FEW (*)    All other components within normal limits  URINE CULTURE   Imaging Review No results found.  MDM  No diagnosis found. Discussed broad differential with pt for hematuria.  He may have passed a  stone as he had mild ruq pain recently.  Bedside US no hydronephrosis or cysts. Pt will fup with urology for further eval and possible cysto. No known hx of CA.  Pt understands importance of fup. DC well appearing.   Hematuria  Enid Skeens, MD 01/28/13 1309

## 2013-01-28 NOTE — ED Notes (Signed)
To ED for eval of hematuria. Started this am. No pain.

## 2013-01-28 NOTE — ED Notes (Signed)
Pt discharged home. Instructed to follow up with urologist. Has no further questions at the time.

## 2013-01-29 LAB — URINE CULTURE: Colony Count: NO GROWTH

## 2013-02-20 DIAGNOSIS — N200 Calculus of kidney: Secondary | ICD-10-CM | POA: Insufficient documentation

## 2013-02-20 DIAGNOSIS — R319 Hematuria, unspecified: Secondary | ICD-10-CM | POA: Insufficient documentation

## 2013-04-13 DIAGNOSIS — I249 Acute ischemic heart disease, unspecified: Secondary | ICD-10-CM | POA: Insufficient documentation

## 2014-03-17 ENCOUNTER — Encounter (HOSPITAL_COMMUNITY): Payer: Self-pay | Admitting: *Deleted

## 2014-03-17 ENCOUNTER — Emergency Department (HOSPITAL_COMMUNITY)
Admission: EM | Admit: 2014-03-17 | Discharge: 2014-03-17 | Disposition: A | Discharge status: Home / Self Care | Payer: BC Managed Care – PPO | Attending: Emergency Medicine | Admitting: Emergency Medicine

## 2014-03-17 DIAGNOSIS — M545 Low back pain, unspecified: Secondary | ICD-10-CM

## 2014-03-17 DIAGNOSIS — I10 Essential (primary) hypertension: Secondary | ICD-10-CM | POA: Diagnosis not present

## 2014-03-17 DIAGNOSIS — Z720 Tobacco use: Secondary | ICD-10-CM | POA: Insufficient documentation

## 2014-03-17 DIAGNOSIS — E119 Type 2 diabetes mellitus without complications: Secondary | ICD-10-CM | POA: Insufficient documentation

## 2014-03-17 LAB — CBG MONITORING, ED: GLUCOSE-CAPILLARY: 107 mg/dL — AB (ref 70–99)

## 2014-03-17 MED ORDER — PREDNISONE 20 MG PO TABS
40.0000 mg | ORAL_TABLET | Freq: Once | ORAL | Status: AC
  Administered 2014-03-17: 40 mg via ORAL
  Filled 2014-03-17: qty 2

## 2014-03-17 MED ORDER — CYCLOBENZAPRINE HCL 10 MG PO TABS: 10.0000 mg | ORAL_TABLET | Freq: Two times a day (BID) | ORAL | Status: DC | PRN

## 2014-03-17 MED ORDER — TRAMADOL HCL 50 MG PO TABS
50.0000 mg | ORAL_TABLET | ORAL | Status: AC
Start: 2014-03-17 — End: 2014-03-17
  Administered 2014-03-17: 50 mg via ORAL
  Filled 2014-03-17: qty 1

## 2014-03-17 MED ORDER — PREDNISONE 20 MG PO TABS: 40.0000 mg | ORAL_TABLET | Freq: Every day | ORAL | Status: DC

## 2014-03-17 NOTE — Discharge Instructions (Signed)
Please follow the directions provided. Be sure to follow-up with the orthopedic referral if your symptoms worsen or don't improve. You may take the Flexeril for muscle spasms or pain, please take the prednisone daily to help with inflammation. Don't hesitate to return for any new, worsening, or concerning symptoms.  SEEK IMMEDIATE MEDICAL CARE IF:  You have pain that radiates from your back into your legs.  You develop new bowel or bladder control problems.  You have unusual weakness or numbness in your arms or legs.  You develop nausea or vomiting.  You develop abdominal pain.  You feel faint.

## 2014-03-17 NOTE — ED Notes (Signed)
CBG 107 

## 2014-03-17 NOTE — ED Provider Notes (Signed)
CSN: [id number]     Arrival date & time 03/17/14  0944 History  This chart was scribed for non-physician practitioner, Britt Bottom, [place] with Pamella Pert, MD, by Marlowe Kays, ED Scribe. This patient was seen in room TR11C/TR11C and the patient's care was started at 10:25 AM.  Chief Complaint  Patient presents with  . Back Pain   Patient is a 48 y.o. male presenting with back pain. The history is provided by the patient. No language interpreter was used.  Back Pain Associated symptoms: no fever, no numbness and no weakness     HPI Comments:  Kenneth Gilbert is a 48 y.o. male with PMH of alcohol abuse, HTN and DM who presents to the Emergency Department complaining of severe lower back that began two days ago. He states he was getting out of bed when the pain began. Ambulation and movement makes the pain worse. Denies alleviating factors. He has not taken anything for pain. He denies nausea, vomiting, fever, saddle paresthesia, numbness, tingling or weakness of the lower extremities, bowel or bladder incontinence. Denies IV drug use. Reports allergy to Codeine with reaction of dizziness.  Past Medical History  Diagnosis Date  . ETOH abuse    History reviewed. No pertinent past surgical history. History reviewed. No pertinent family history. History  Substance Use Topics  . Smoking status: Current Every Day Smoker -- 0.50 packs/day    Types: Cigarettes  . Smokeless tobacco: Not on file  . Alcohol Use: Yes    Review of Systems  Constitutional: Negative for fever and chills.  Gastrointestinal: Negative for nausea and vomiting.  Genitourinary:       No bowel or bladder incontinence.  Musculoskeletal: Positive for back pain.  Skin: Negative for wound.  Neurological: Negative for weakness and numbness.    Allergies  Codeine  Home Medications   Prior to Admission medications   Medication Sig Start Date End Date Taking? Authorizing Provider  ibuprofen  (ADVIL,MOTRIN) 200 MG tablet Take 400 mg by mouth every 6 (six) hours as needed for pain or headache.    Historical Provider, MD   Triage Vitals: BP 132/81 mmHg  Pulse 92  Temp(Src) 97.7 F (36.5 C) (Oral)  Resp 20  Ht 6\' 2"  (1.88 m)  Wt 189 lb (85.73 kg)  BMI 24.26 kg/m2  SpO2 97% Physical Exam  Constitutional: He is oriented to person, place, and time. He appears well-developed and well-nourished.  HENT:  Head: Normocephalic and atraumatic.  Eyes: EOM are normal.  Neck: Normal range of motion.  Cardiovascular: Normal rate.   Pulmonary/Chest: Effort normal.  Musculoskeletal: Normal range of motion. He exhibits tenderness. He exhibits no edema.  Tenderness to palpation over left SI joint and left lumbar spine and left paraspinal muscles.  Neurological: He is alert and oriented to person, place, and time. No cranial nerve deficit.  5/5 strength with bilateral SLR and plantar and dorsiflexion. Cranial nerves 2-12 grossly intact.  Skin: Skin is warm and dry.  Psychiatric: He has a normal mood and affect. His behavior is normal.  Nursing note and vitals reviewed.   ED Course  Procedures (including critical care time) DIAGNOSTIC STUDIES: Oxygen Saturation is 97% on RA, normal by my interpretation.   COORDINATION OF CARE: 10:31 AM- Will prescribe Tramadol and NSAID. Pt verbalizes understanding and agrees to plan.  Medications - No data to display  Labs Review Labs Reviewed  CBG MONITORING, ED - Abnormal; Notable for the following:    Glucose-Capillary 107 (*)  All other components within normal limits    Imaging Review No results found.   EKG Interpretation None      MDM   Final diagnoses:  Left-sided low back pain without sciatica   48 yo male with back pain.  He has no neurological deficits and his neuro exam is normal.  He denies  bowel or bladder incontinence.  There is no indication for cauda equina.  He denies fever, night sweats, weight loss, h/o cancer,  IVDU. He is able to ambulate in the ED without difficulty. Pt would prefer a prescription for non-opioid medicine. Discharge instructions include symptomatic mgmt and muscle relaxant and resources to establish care with a PCP.  Return precautions provided.   Perrsonally performed the services described in this documentation, which was scribed in my presence. The recorded information has been reviewed and is accurate.  Filed Vitals:   03/17/14 1003 03/17/14 1104  BP: 132/81 129/76  Pulse: 92 78  Temp: 97.7 F (36.5 C)   TempSrc: Oral   Resp: 20 18  Height: 6\' 2"  (1.88 m)   Weight: 189 lb (85.73 kg)   SpO2: 97% 100%   Meds given in ED:  Medications  traMADol (ULTRAM) tablet 50 mg (50 mg Oral Given 03/17/14 1103)  predniSONE (DELTASONE) tablet 40 mg (40 mg Oral Given 03/17/14 1103)    Discharge Medication List as of 03/17/2014 10:54 AM    START taking these medications   Details  cyclobenzaprine (FLEXERIL) 10 MG tablet Take 1 tablet (10 mg total) by mouth 2 (two) times daily as needed for muscle spasms., Starting 03/17/2014, Until Discontinued, Print    predniSONE (DELTASONE) 20 MG tablet Take 2 tablets (40 mg total) by mouth daily., Starting 03/17/2014, Until Discontinued, Print         Britt Bottom, NP 03/18/14 2252  Pamella Pert, MD 03/19/14 1416

## 2014-03-17 NOTE — ED Notes (Signed)
Pt reports feeling a pop on Monday morning when getting out of bed and now having lower back pain. Ambulatory at triage.

## 2014-10-25 ENCOUNTER — Other Ambulatory Visit: Payer: Self-pay | Admitting: Gastroenterology

## 2014-10-25 DIAGNOSIS — R0789 Other chest pain: Secondary | ICD-10-CM

## 2014-11-12 ENCOUNTER — Encounter (HOSPITAL_COMMUNITY): Payer: BLUE CROSS/BLUE SHIELD

## 2014-11-12 ENCOUNTER — Ambulatory Visit (HOSPITAL_COMMUNITY): Payer: BLUE CROSS/BLUE SHIELD

## 2014-12-07 ENCOUNTER — Emergency Department (HOSPITAL_COMMUNITY)
Admission: EM | Admit: 2014-12-07 | Discharge: 2014-12-07 | Disposition: A | Discharge status: Home / Self Care | Payer: BLUE CROSS/BLUE SHIELD | Attending: Physician Assistant | Admitting: Physician Assistant

## 2014-12-07 ENCOUNTER — Encounter (HOSPITAL_COMMUNITY): Payer: Self-pay | Admitting: Emergency Medicine

## 2014-12-07 DIAGNOSIS — Z720 Tobacco use: Secondary | ICD-10-CM | POA: Insufficient documentation

## 2014-12-07 DIAGNOSIS — R42 Dizziness and giddiness: Secondary | ICD-10-CM | POA: Insufficient documentation

## 2014-12-07 LAB — CBC WITH DIFFERENTIAL/PLATELET
BASOS ABS: 0 10*3/uL (ref 0.0–0.1)
BASOS PCT: 0 % (ref 0–1)
EOS ABS: 0.1 10*3/uL (ref 0.0–0.7)
Eosinophils Relative: 1 % (ref 0–5)
HCT: 42 % (ref 39.0–52.0)
Hemoglobin: 14.8 g/dL (ref 13.0–17.0)
LYMPHS PCT: 23 % (ref 12–46)
Lymphs Abs: 1.4 10*3/uL (ref 0.7–4.0)
MCH: 33.2 pg (ref 26.0–34.0)
MCHC: 35.2 g/dL (ref 30.0–36.0)
MCV: 94.2 fL (ref 78.0–100.0)
MONOS PCT: 7 % (ref 3–12)
Monocytes Absolute: 0.4 10*3/uL (ref 0.1–1.0)
NEUTROS ABS: 4.2 10*3/uL (ref 1.7–7.7)
Neutrophils Relative %: 69 % (ref 43–77)
Platelets: 149 10*3/uL — ABNORMAL LOW (ref 150–400)
RBC: 4.46 MIL/uL (ref 4.22–5.81)
RDW: 12.3 % (ref 11.5–15.5)
WBC: 6.1 10*3/uL (ref 4.0–10.5)

## 2014-12-07 LAB — I-STAT TROPONIN, ED: TROPONIN I, POC: 0 ng/mL (ref 0.00–0.08)

## 2014-12-07 LAB — BASIC METABOLIC PANEL
Anion gap: 6 (ref 5–15)
BUN: 17 mg/dL (ref 6–20)
CO2: 24 mmol/L (ref 22–32)
CREATININE: 0.83 mg/dL (ref 0.61–1.24)
Calcium: 8.8 mg/dL — ABNORMAL LOW (ref 8.9–10.3)
Chloride: 110 mmol/L (ref 101–111)
GFR calc Af Amer: >60 mL/min (ref >60)
GFR calc non Af Amer: >60 mL/min (ref >60)
Glucose, Bld: 112 mg/dL — ABNORMAL HIGH (ref 65–99)
Potassium: 4 mmol/L (ref 3.5–5.1)
SODIUM: 140 mmol/L (ref 135–145)

## 2014-12-07 LAB — CK: Total CK: 110 U/L (ref 49–397)

## 2014-12-07 MED ORDER — MECLIZINE HCL 25 MG PO TABS: 25.0000 mg | ORAL_TABLET | Freq: Three times a day (TID) | ORAL | Status: DC | PRN

## 2014-12-07 MED ORDER — DIAZEPAM 5 MG/ML IJ SOLN
5.0000 mg | Freq: Once | INTRAMUSCULAR | Status: AC
  Administered 2014-12-07: 5 mg via INTRAVENOUS
  Filled 2014-12-07: qty 2

## 2014-12-07 MED ORDER — ONDANSETRON HCL 4 MG/2ML IJ SOLN
4.0000 mg | Freq: Once | INTRAMUSCULAR | Status: DC
Start: 2014-12-07 — End: 2014-12-08
  Filled 2014-12-07: qty 2

## 2014-12-07 MED ORDER — SODIUM CHLORIDE 0.9 % IV BOLUS (SEPSIS)
1000.0000 mL | Freq: Once | INTRAVENOUS | Status: AC
  Administered 2014-12-07: 1000 mL via INTRAVENOUS

## 2014-12-07 MED ORDER — SODIUM CHLORIDE 0.9 % IV BOLUS (SEPSIS)
1000.0000 mL | Freq: Once | INTRAVENOUS | Status: AC
Start: 2014-12-07 — End: 2014-12-07
  Administered 2014-12-07: 1000 mL via INTRAVENOUS

## 2014-12-07 MED ORDER — ONDANSETRON 4 MG PO TBDP: 4.0000 mg | ORAL_TABLET | Freq: Three times a day (TID) | ORAL | Status: DC | PRN

## 2014-12-07 MED ORDER — DIAZEPAM 5 MG PO TABS: 5.0000 mg | ORAL_TABLET | Freq: Two times a day (BID) | ORAL | Status: DC | PRN

## 2014-12-07 MED ORDER — MECLIZINE HCL 25 MG PO TABS
50.0000 mg | ORAL_TABLET | Freq: Once | ORAL | Status: AC
  Administered 2014-12-07: 50 mg via ORAL
  Filled 2014-12-07: qty 2

## 2014-12-07 NOTE — ED Provider Notes (Signed)
CSN: [id number]     Arrival date & time 12/07/14  1615 History   First MD Initiated Contact with Patient 12/07/14 1628     Chief Complaint  Patient presents with  . Dizziness     (Consider location/radiation/quality/duration/timing/severity/associated sxs/prior Treatment) Patient is a 49 y.o. male presenting with dizziness. The history is provided by the patient and medical records.  Dizziness   This is a 49 y.o. M with hx of high cholesterol and alcohol abuse, presenting to the ED for dizziness.  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  He has been outside since 0600 this morning.  States this afternoon he stood up and began feeling dizzy as if things around him were spinning.   He denies  Any tinnitus, visual disturbance, changes in speech, ataxia, numbness or weakness , or facial droop.  No headache or neck pain.  He reports he did eat lunch and has been drinking water throughout the day today.  He denies any chest pain, SOB, palpitations.  He has had some nausea but denies vomiting or diarrhea.  Patient denies recent fever or illness.  He denies significant muscle aches aside from normal soreness from working.   Vital signs stable on arrival.  Past Medical History  Diagnosis Date  . ETOH abuse    History reviewed. No pertinent past surgical history. No family history on file. History  Substance Use Topics  . Smoking status: Current Every Day Smoker -- 0.50 packs/day    Types: Cigarettes  . Smokeless tobacco: Not on file  . Alcohol Use: Yes    Review of Systems  Neurological: Positive for dizziness.  All other systems reviewed and are negative.     Allergies  Codeine  Home Medications   Prior to Admission medications   Medication Sig Start Date End Date Taking? Authorizing Provider  cromolyn (OPTICROM) 4 % ophthalmic solution Place 1 drop into both eyes 4 (four) times daily.  11/24/14  Yes Historical Provider, MD   ibuprofen (ADVIL,MOTRIN) 200 MG tablet Take 400 mg by mouth every 6 (six) hours as needed for pain or headache.   Yes Historical Provider, MD   BP 137/72 mmHg  Pulse 70  Temp(Src) 97.6 F (36.4 C) (Oral)  Resp 18  SpO2 100%   Physical Exam  Constitutional: He is oriented to person, place, and time. He appears well-developed and well-nourished. No distress.  HENT:  Head: Normocephalic and atraumatic.  Mouth/Throat: Oropharynx is clear and moist.  Eyes: Conjunctivae and EOM are normal. Pupils are equal, round, and reactive to light.  Neck: Normal range of motion. Neck supple.  Cardiovascular: Normal rate, regular rhythm and normal heart sounds.   Pulmonary/Chest: Effort normal and breath sounds normal. No respiratory distress. He has no wheezes.  Abdominal: Soft. Bowel sounds are normal. There is no tenderness. There is no guarding.  Musculoskeletal: Normal range of motion. He exhibits no edema.  Neurological: He is alert and oriented to person, place, and time. No cranial nerve deficit.  AAOx3, answering questions and following commands appropriately; equal strength UE and LE bilaterally; CN grossly intact; moves all extremities appropriately without ataxia; negative pronator drift; normal coordination; no focal neuro deficits or facial asymmetry appreciated; normal speech + dizziness with movement/ambulation; no nystagmus  Skin: Skin is warm and dry. He is not diaphoretic.  Psychiatric: He has a normal mood and affect.  Nursing note and vitals reviewed.   ED Course  Procedures (including critical care  time) Labs Review Labs Reviewed  CBC WITH DIFFERENTIAL/PLATELET - Abnormal; Notable for the following:    Platelets 149 (*)    All other components within normal limits  BASIC METABOLIC PANEL - Abnormal; Notable for the following:    Glucose, Bld 112 (*)    Calcium 8.8 (*)    All other components within normal limits  CK  I-STAT TROPOININ, ED    Imaging Review No results  found.   EKG Interpretation   Date/Time:  Tuesday December 07 2014 17:06:44 EDT Ventricular Rate:  70 PR Interval:  152 QRS Duration: 101 QT Interval:  391 QTC Calculation: 422 R Axis:   6 Text Interpretation:  Second degree AV block, Mobitz II Anterior infarct,  old No significant change was found Confirmed by Gerald Leitz ([id number])  on 12/07/2014 6:31:16 PM      MDM   Final diagnoses:  Dizziness   49 year old male here with sudden onset of dizziness while working.   Dizziness described as room spinning. Patient has working outside in the heat all day , states he does this daily. Patient is afebrile, nontoxic. His neurologic exam is nonfocal. His dizziness is reproducible with movement and ambulation, no noted nystagmus.  Labwork as above, no significant electrolyte imbalance. Troponin and CK are negative.   EKG with second degree AV block noted, no change from prior.  Patient was treated with IVF, meclizine, and valium with significant improvement of symptoms.  Repeat neurologic exam remains non-focal.   Patient was able to ambulate in the hallway without assistance. His gait was steady without noted ataxia. No dizziness with ambulation. I suspect his symptoms were due to peripheral vertigo.  Patient will be discharged home with supportive care-- meclizine, zofran, valium.  Discussed plan with patient, he/she acknowledged understanding and agreed with plan of care.  Return precautions given for new or worsening symptoms.  Larene Pickett, PA-C 12/07/14 2228  Courteney Julio Alm, MD 12/08/14 0370

## 2014-12-07 NOTE — ED Notes (Signed)
Pt ambulated in hallway without any assistance. Pt did not complain of being dizzy at the time. Pt stated he feels a lot better from earlier.

## 2014-12-07 NOTE — Discharge Instructions (Signed)
Take the prescribed medication as directed. Rest tomorrow, drink plenty of fluids. Return to the ED for new or worsening symptoms.

## 2014-12-07 NOTE — ED Notes (Signed)
Per ems pt from outside working  Since 6 Am. Pt reported dizziness ,  sweats and weakness, denies blurry vision, BP 126/82. Pt received 500 cc NS IV.

## 2014-12-07 NOTE — ED Notes (Signed)
Bed: QV79 Expected date:  Expected time:  Means of arrival:  Comments: EMS/2M/?heat stroke

## 2014-12-09 ENCOUNTER — Encounter (HOSPITAL_COMMUNITY)
Admission: RE | Admit: 2014-12-09 | Discharge: 2014-12-09 | Disposition: A | Discharge status: Home / Self Care | Payer: BLUE CROSS/BLUE SHIELD | Source: Ambulatory Visit | Attending: Gastroenterology | Admitting: Gastroenterology

## 2014-12-09 ENCOUNTER — Ambulatory Visit (HOSPITAL_COMMUNITY)
Admission: RE | Admit: 2014-12-09 | Discharge: 2014-12-09 | Disposition: A | Discharge status: Home / Self Care | Payer: BLUE CROSS/BLUE SHIELD | Source: Ambulatory Visit | Attending: Gastroenterology | Admitting: Gastroenterology

## 2014-12-09 DIAGNOSIS — R0789 Other chest pain: Secondary | ICD-10-CM | POA: Diagnosis present

## 2014-12-09 DIAGNOSIS — R110 Nausea: Secondary | ICD-10-CM | POA: Insufficient documentation

## 2014-12-09 DIAGNOSIS — R109 Unspecified abdominal pain: Secondary | ICD-10-CM | POA: Insufficient documentation

## 2014-12-09 MED ORDER — SINCALIDE 5 MCG IJ SOLR: 0.0200 ug/kg | Freq: Once | INTRAMUSCULAR | Status: DC

## 2014-12-09 MED ORDER — TECHNETIUM TC 99M MEBROFENIN IV KIT
5.0000 | PACK | Freq: Once | INTRAVENOUS | Status: AC | PRN
  Administered 2014-12-09: 5 via INTRAVENOUS

## 2014-12-09 MED ORDER — STERILE WATER FOR INJECTION IJ SOLN
INTRAMUSCULAR | Status: AC
  Filled 2014-12-09: qty 10

## 2014-12-09 MED ORDER — SINCALIDE 5 MCG IJ SOLR
INTRAMUSCULAR | Status: AC
  Administered 2014-12-09: 1.6 ug
  Filled 2014-12-09: qty 5

## 2015-08-29 ENCOUNTER — Encounter (HOSPITAL_COMMUNITY): Payer: Self-pay | Admitting: Emergency Medicine

## 2015-08-29 ENCOUNTER — Emergency Department (HOSPITAL_COMMUNITY): Payer: BLUE CROSS/BLUE SHIELD

## 2015-08-29 ENCOUNTER — Emergency Department (HOSPITAL_COMMUNITY)
Admission: EM | Admit: 2015-08-29 | Discharge: 2015-08-29 | Disposition: A | Discharge status: Home / Self Care | Payer: BLUE CROSS/BLUE SHIELD | Attending: Emergency Medicine | Admitting: Emergency Medicine

## 2015-08-29 DIAGNOSIS — M79645 Pain in left finger(s): Secondary | ICD-10-CM

## 2015-08-29 DIAGNOSIS — Y9389 Activity, other specified: Secondary | ICD-10-CM | POA: Insufficient documentation

## 2015-08-29 DIAGNOSIS — S6992XA Unspecified injury of left wrist, hand and finger(s), initial encounter: Secondary | ICD-10-CM | POA: Insufficient documentation

## 2015-08-29 DIAGNOSIS — Y9289 Other specified places as the place of occurrence of the external cause: Secondary | ICD-10-CM | POA: Insufficient documentation

## 2015-08-29 DIAGNOSIS — Z79899 Other long term (current) drug therapy: Secondary | ICD-10-CM | POA: Insufficient documentation

## 2015-08-29 DIAGNOSIS — Y998 Other external cause status: Secondary | ICD-10-CM | POA: Diagnosis not present

## 2015-08-29 DIAGNOSIS — Z87891 Personal history of nicotine dependence: Secondary | ICD-10-CM | POA: Insufficient documentation

## 2015-08-29 DIAGNOSIS — X509XXA Other and unspecified overexertion or strenuous movements or postures, initial encounter: Secondary | ICD-10-CM | POA: Insufficient documentation

## 2015-08-29 MED ORDER — NAPROXEN 250 MG PO TABS
500.0000 mg | ORAL_TABLET | Freq: Once | ORAL | Status: AC
  Administered 2015-08-29: 500 mg via ORAL
  Filled 2015-08-29: qty 2

## 2015-08-29 MED ORDER — NAPROXEN 500 MG PO TABS: 500.0000 mg | ORAL_TABLET | Freq: Two times a day (BID) | ORAL | Status: DC

## 2015-08-29 NOTE — ED Notes (Signed)
Declined W/C at D/C and was escorted to lobby by RN. 

## 2015-08-29 NOTE — Progress Notes (Signed)
Orthopedic Tech Progress Note Patient Details:  Kenneth Gilbert 1965/07/22 [id number]  Ortho Devices Type of Ortho Device: Thumb velcro splint Ortho Device/Splint Interventions: Application   [name] 08/29/2015, 11:09 AM

## 2015-08-29 NOTE — ED Notes (Signed)
Patient called x2 in main ED waiting area with no response 

## 2015-08-29 NOTE — ED Notes (Signed)
Patient called in main ED waiting area with no response 

## 2015-08-29 NOTE — ED Notes (Signed)
Pt c/o left thumb "feels like it is popping out of place" denies any injury, thumb slightly swollen

## 2015-08-29 NOTE — Discharge Instructions (Signed)
Please call Dr. Apolonio Schneiders to schedule a follow up evaluation of your thumb pain. In the meantime take naproxen as needed for pain. Return to the ER for new or worsening symptoms.

## 2015-08-29 NOTE — ED Provider Notes (Signed)
CSN: [id number]     Arrival date & time 08/29/15  [id number] History  By signing my name below, I, Essence Howell, attest that this documentation has been prepared under the direction and in the presence of Delrae Rend, PA-C Electronically Signed: Ladene Artist, ED Scribe 08/29/2015 at 10:41 AM.   Chief Complaint  Patient presents with  . thumb injury    The history is provided by the patient. No language interpreter was used.   HPI Comments: Kenneth Gilbert is a 50 y.o. male who presents to the Emergency Department complaining of a finger injury sustained 2 weeks ago. Pt states that he was changing an oil filter in his car 2 weeks ago when his left thumb "popped out of place" and "popped it back in". He currently rates pain as a 9/10, worsened with movement. He has tried ibuprofen without significant relief. Pt reports injury to the same finger years ago while playing football but never had this evaluated. Describes the injury then as a similar instance where his thumb "popped out" and he put it back into place.    Past Medical History  Diagnosis Date  . ETOH abuse    History reviewed. No pertinent past surgical history. No family history on file. Social History  Substance Use Topics  . Smoking status: Former Smoker -- 0.50 packs/day    Types: Cigarettes  . Smokeless tobacco: None  . Alcohol Use: Yes     Comment: occasionally    Review of Systems  Musculoskeletal: Positive for arthralgias.  All other systems reviewed and are negative.  Allergies  Codeine  Home Medications   Prior to Admission medications   Medication Sig Start Date End Date Taking? Authorizing Provider  cromolyn (OPTICROM) 4 % ophthalmic solution Place 1 drop into both eyes 4 (four) times daily.  11/24/14   Historical Provider, MD  diazepam (VALIUM) 5 MG tablet Take 1 tablet (5 mg total) by mouth every 12 (twelve) hours as needed (dizziness). 12/07/14   Larene Pickett, PA-C  ibuprofen (ADVIL,MOTRIN) 200 MG tablet  Take 400 mg by mouth every 6 (six) hours as needed for pain or headache.    Historical Provider, MD  meclizine (ANTIVERT) 25 MG tablet Take 1 tablet (25 mg total) by mouth 3 (three) times daily as needed for dizziness. 12/07/14   Larene Pickett, PA-C  ondansetron (ZOFRAN ODT) 4 MG disintegrating tablet Take 1 tablet (4 mg total) by mouth every 8 (eight) hours as needed for nausea. 12/07/14   Larene Pickett, PA-C   BP 117/66 mmHg  Pulse 73  Temp(Src) 98.1 F (36.7 C) (Oral)  Resp 24  SpO2 97% Physical Exam  Constitutional: He is oriented to person, place, and time. He appears well-developed and well-nourished. No distress.  HENT:  Head: Normocephalic and atraumatic.  Eyes: Conjunctivae and EOM are normal.  Neck: Neck supple. No tracheal deviation present.  Cardiovascular: Normal rate.   Pulmonary/Chest: Effort normal. No respiratory distress.  Musculoskeletal: Normal range of motion.  L thumb diffusely tender particularly at the MCP. FROM. Brisk cap refill. Sensation intact.   Neurological: He is alert and oriented to person, place, and time.  Skin: Skin is warm and dry.  Psychiatric: He has a normal mood and affect. His behavior is normal.  Nursing note and vitals reviewed.  ED Course  Procedures (including critical care time) DIAGNOSTIC STUDIES: Oxygen Saturation is 97% on RA, normal by my interpretation.    COORDINATION OF CARE: 10:35 AM-Discussed treatment plan which  includes XR, Naproxen and thumb spica with pt at bedside and pt agreed to plan.   Labs Review Labs Reviewed - No data to display  Imaging Review Dg Finger Thumb Left  08/29/2015  CLINICAL DATA:  Left lung injury while working on a car 08/28/2015. Initial encounter. EXAM: LEFT THUMB 2+V COMPARISON:  None. FINDINGS: No acute bony or joint abnormality is identified. Soft tissues are unremarkable. Mild first CMC osteoarthritis is noted. IMPRESSION: No acute abnormality. Electronically Signed   By: Inge Rise M.D.    On: 08/29/2015 10:14   I have personally reviewed and evaluated these images and lab results as part of my medical decision-making.   EKG Interpretation None      MDM   Final diagnoses:  Thumb pain, left    History suggestive of thumb dislocation with reduction by pt on his own, now with continued pain. He does have diffuse tenderness particularly at the base of the thumb. X-ray negative for acute findings. Given pt's pain will place in thumb spica splint with rx for naproxen. Instructed to f/u with hand for further evaluation and management. ER return precautions given.   I personally performed the services described in this documentation, which was scribed in my presence. The recorded information has been reviewed and is accurate.    Anne Ng, PA-C 08/29/15 1121  Tanna Furry, MD 09/06/15 [phone]

## 2015-09-09 DIAGNOSIS — M65312 Trigger thumb, left thumb: Secondary | ICD-10-CM | POA: Diagnosis not present

## 2016-02-22 DIAGNOSIS — S9031XA Contusion of right foot, initial encounter: Secondary | ICD-10-CM | POA: Diagnosis not present

## 2016-02-22 DIAGNOSIS — Z87891 Personal history of nicotine dependence: Secondary | ICD-10-CM | POA: Diagnosis not present

## 2016-02-22 DIAGNOSIS — S9030XA Contusion of unspecified foot, initial encounter: Secondary | ICD-10-CM | POA: Diagnosis not present

## 2016-05-22 DIAGNOSIS — J111 Influenza due to unidentified influenza virus with other respiratory manifestations: Secondary | ICD-10-CM | POA: Diagnosis not present

## 2016-05-22 DIAGNOSIS — Z87891 Personal history of nicotine dependence: Secondary | ICD-10-CM | POA: Diagnosis not present

## 2016-05-29 DIAGNOSIS — R197 Diarrhea, unspecified: Secondary | ICD-10-CM | POA: Diagnosis not present

## 2016-05-29 DIAGNOSIS — R112 Nausea with vomiting, unspecified: Secondary | ICD-10-CM | POA: Diagnosis not present

## 2016-05-29 DIAGNOSIS — K529 Noninfective gastroenteritis and colitis, unspecified: Secondary | ICD-10-CM | POA: Diagnosis not present

## 2017-01-08 DIAGNOSIS — Z87891 Personal history of nicotine dependence: Secondary | ICD-10-CM | POA: Diagnosis not present

## 2017-01-08 DIAGNOSIS — R110 Nausea: Secondary | ICD-10-CM | POA: Diagnosis not present

## 2017-01-08 DIAGNOSIS — Z79899 Other long term (current) drug therapy: Secondary | ICD-10-CM | POA: Diagnosis not present

## 2017-01-08 DIAGNOSIS — R1012 Left upper quadrant pain: Secondary | ICD-10-CM | POA: Diagnosis not present

## 2017-01-08 DIAGNOSIS — R1114 Bilious vomiting: Secondary | ICD-10-CM | POA: Diagnosis not present

## 2017-04-10 ENCOUNTER — Encounter (HOSPITAL_COMMUNITY): Payer: Self-pay | Admitting: Emergency Medicine

## 2017-04-10 ENCOUNTER — Emergency Department (HOSPITAL_COMMUNITY): Payer: BLUE CROSS/BLUE SHIELD

## 2017-04-10 ENCOUNTER — Other Ambulatory Visit: Payer: Self-pay

## 2017-04-10 ENCOUNTER — Emergency Department (HOSPITAL_COMMUNITY)
Admission: EM | Admit: 2017-04-10 | Discharge: 2017-04-11 | Disposition: A | Discharge status: Home / Self Care | Payer: BLUE CROSS/BLUE SHIELD | Attending: Emergency Medicine | Admitting: Emergency Medicine

## 2017-04-10 DIAGNOSIS — R202 Paresthesia of skin: Secondary | ICD-10-CM | POA: Diagnosis not present

## 2017-04-10 DIAGNOSIS — R1011 Right upper quadrant pain: Secondary | ICD-10-CM | POA: Diagnosis not present

## 2017-04-10 DIAGNOSIS — F17210 Nicotine dependence, cigarettes, uncomplicated: Secondary | ICD-10-CM | POA: Insufficient documentation

## 2017-04-10 DIAGNOSIS — R072 Precordial pain: Secondary | ICD-10-CM | POA: Insufficient documentation

## 2017-04-10 DIAGNOSIS — R1013 Epigastric pain: Secondary | ICD-10-CM | POA: Diagnosis not present

## 2017-04-10 DIAGNOSIS — N200 Calculus of kidney: Secondary | ICD-10-CM | POA: Diagnosis not present

## 2017-04-10 DIAGNOSIS — R079 Chest pain, unspecified: Secondary | ICD-10-CM | POA: Diagnosis not present

## 2017-04-10 LAB — CBC WITH DIFFERENTIAL/PLATELET
Basophils Absolute: 0 10*3/uL (ref 0.0–0.1)
Basophils Relative: 0 %
EOS ABS: 0.1 10*3/uL (ref 0.0–0.7)
Eosinophils Relative: 1 %
HEMATOCRIT: 42.8 % (ref 39.0–52.0)
HEMOGLOBIN: 14.2 g/dL (ref 13.0–17.0)
LYMPHS ABS: 2.5 10*3/uL (ref 0.7–4.0)
Lymphocytes Relative: 34 %
MCH: 32.3 pg (ref 26.0–34.0)
MCHC: 33.2 g/dL (ref 30.0–36.0)
MCV: 97.3 fL (ref 78.0–100.0)
MONOS PCT: 6 %
Monocytes Absolute: 0.5 10*3/uL (ref 0.1–1.0)
NEUTROS ABS: 4.4 10*3/uL (ref 1.7–7.7)
NEUTROS PCT: 59 %
Platelets: 149 10*3/uL — ABNORMAL LOW (ref 150–400)
RBC: 4.4 MIL/uL (ref 4.22–5.81)
RDW: 12.5 % (ref 11.5–15.5)
WBC: 7.5 10*3/uL (ref 4.0–10.5)

## 2017-04-10 LAB — COMPREHENSIVE METABOLIC PANEL
ALBUMIN: 3.8 g/dL (ref 3.5–5.0)
ALT: 9 U/L — ABNORMAL LOW (ref 17–63)
AST: 12 U/L — ABNORMAL LOW (ref 15–41)
Alkaline Phosphatase: 68 U/L (ref 38–126)
Anion gap: 5 (ref 5–15)
BUN: 13 mg/dL (ref 6–20)
CHLORIDE: 110 mmol/L (ref 101–111)
CO2: 23 mmol/L (ref 22–32)
Calcium: 8.3 mg/dL — ABNORMAL LOW (ref 8.9–10.3)
Creatinine, Ser: 0.64 mg/dL (ref 0.61–1.24)
GFR calc Af Amer: >60 mL/min (ref >60)
GFR calc non Af Amer: >60 mL/min (ref >60)
GLUCOSE: 95 mg/dL (ref 65–99)
POTASSIUM: 3.6 mmol/L (ref 3.5–5.1)
SODIUM: 138 mmol/L (ref 135–145)
Total Bilirubin: 0.8 mg/dL (ref 0.3–1.2)
Total Protein: 6.3 g/dL — ABNORMAL LOW (ref 6.5–8.1)

## 2017-04-10 LAB — TROPONIN I

## 2017-04-10 LAB — LIPASE, BLOOD: Lipase: 47 U/L (ref 11–51)

## 2017-04-10 MED ORDER — ONDANSETRON HCL 4 MG/2ML IJ SOLN
4.0000 mg | Freq: Once | INTRAMUSCULAR | Status: AC
  Administered 2017-04-10: 4 mg via INTRAVENOUS
  Filled 2017-04-10: qty 2

## 2017-04-10 MED ORDER — PANTOPRAZOLE SODIUM 40 MG IV SOLR
40.0000 mg | Freq: Once | INTRAVENOUS | Status: AC
  Administered 2017-04-10: 40 mg via INTRAVENOUS
  Filled 2017-04-10: qty 40

## 2017-04-10 MED ORDER — SODIUM CHLORIDE 0.9 % IV SOLN
INTRAVENOUS | Status: DC
  Administered 2017-04-10: 22:00:00 via INTRAVENOUS

## 2017-04-10 NOTE — ED Notes (Signed)
Pt ambulatory to restroom

## 2017-04-10 NOTE — ED Triage Notes (Signed)
Pt c/o abd pain that is a burning sensation.

## 2017-04-10 NOTE — ED Notes (Signed)
Gave EKG to Dr Zackowski. 

## 2017-04-11 ENCOUNTER — Emergency Department (HOSPITAL_COMMUNITY): Payer: BLUE CROSS/BLUE SHIELD

## 2017-04-11 DIAGNOSIS — N200 Calculus of kidney: Secondary | ICD-10-CM | POA: Diagnosis not present

## 2017-04-11 LAB — TROPONIN I

## 2017-04-11 MED ORDER — IOPAMIDOL (ISOVUE-300) INJECTION 61%
100.0000 mL | Freq: Once | INTRAVENOUS | Status: AC | PRN
  Administered 2017-04-11: 100 mL via INTRAVENOUS

## 2017-04-11 MED ORDER — OMEPRAZOLE 20 MG PO CPDR: DELAYED_RELEASE_CAPSULE | ORAL | 0 refills | Status: DC

## 2017-04-11 NOTE — ED Notes (Signed)
Pt ambulatory to waiting room. Pt verbalized understanding of discharge instructions.   

## 2017-04-11 NOTE — ED Provider Notes (Signed)
[facility] EMERGENCY DEPARTMENT Provider Note   CSN: [id number] Arrival date & time: 04/10/17  2035     History   Chief Complaint Chief Complaint  Patient presents with  . Abdominal Pain    HPI Kenneth Gilbert is a 51 y.o. male.  Patient with acute onset of right upper quadrant abdominal pain radiating up into the right chest down the right arm described as a burning sensation.  At 2000.  Symptoms have persisted.  Patient is never had anything like this before.  Not associated with nausea or vomiting.  Not associated with shortness of breath.      Past Medical History:  Diagnosis Date  . ETOH abuse     Patient Active Problem List   Diagnosis Date Noted  . EPIGASTRIC PAIN 10/31/2009  . OTALGIA 08/01/2009  . IMPACTED CERUMEN 07/12/2009  . HEARING DEFICIT 07/12/2009  . GERD 07/12/2009  . SEBACEOUS CYST, NECK 07/12/2009    Past Surgical History:  Procedure Laterality Date  . arm surgery         Home Medications    Prior to Admission medications   Medication Sig Start Date End Date Taking? Authorizing Provider  ibuprofen (ADVIL,MOTRIN) 200 MG tablet Take 400 mg by mouth every 6 (six) hours as needed for pain or headache.   Yes [provider]    Family History No family history on file.  Social History Social History   Tobacco Use  . Smoking status: Current Every Day Smoker    Packs/day: 0.50    Types: Cigarettes  . Smokeless tobacco: Never Used  Substance Use Topics  . Alcohol use: Yes    Comment: occasionally  . Drug use: Yes    Types: Marijuana     Allergies   Codeine   Review of Systems Review of Systems  Constitutional: Negative for fever.  HENT: Negative for congestion.   Eyes: Negative for redness.  Respiratory: Negative for shortness of breath.   Cardiovascular: Positive for chest pain.  Gastrointestinal: Positive for abdominal pain. Negative for diarrhea, nausea and vomiting.  Genitourinary: Negative for dysuria.    Musculoskeletal: Negative for back pain.  Skin: Negative for rash.  Neurological: Negative for headaches.  Hematological: Does not bruise/bleed easily.  Psychiatric/Behavioral: Negative for confusion.     Physical Exam Updated Vital Signs BP 124/80   Pulse 74   Resp (!) 24   Ht 1.88 m (6\' 2" )   Wt 83.9 kg (185 lb)   SpO2 96%   BMI 23.75 kg/m   Physical Exam  Constitutional: He is oriented to person, place, and time. He appears well-developed and well-nourished.  HENT:  Head: Normocephalic and atraumatic.  Eyes: EOM are normal. Pupils are equal, round, and reactive to light.  Neck: Normal range of motion.  Cardiovascular: Normal rate.  Pulmonary/Chest: Effort normal and breath sounds normal. No respiratory distress.  Abdominal: Soft. Bowel sounds are normal.  Musculoskeletal: Normal range of motion. He exhibits no edema.  Neurological: He is alert and oriented to person, place, and time.  Skin: Skin is warm.  Nursing note and vitals reviewed.    ED Treatments / Results  Labs (all labs ordered are listed, but only abnormal results are displayed) Labs Reviewed  COMPREHENSIVE METABOLIC PANEL - Abnormal; Notable for the following components:      Result Value   Calcium 8.3 (*)    Total Protein 6.3 (*)    AST 12 (*)    ALT 9 (*)    All  other components within normal limits  CBC WITH DIFFERENTIAL/PLATELET - Abnormal; Notable for the following components:   Platelets 149 (*)    All other components within normal limits  LIPASE, BLOOD  TROPONIN I  TROPONIN I    EKG  EKG Interpretation  Date/Time:  Wednesday April 10 2017 22:03:44 EST Ventricular Rate:  72 PR Interval:    QRS Duration: 101 QT Interval:  377 QTC Calculation: 413 R Axis:   -25 Text Interpretation:  Sinus rhythm Probable left atrial enlargement Borderline left axis deviation Probable anteroseptal infarct, old Borderline T abnormalities, inferior leads No significant change since last tracing  Confirmed by Fredia Sorrow [phone]) on 04/10/2017 10:13:23 PM       Radiology Dg Chest 2 View  Result Date: 04/10/2017 CLINICAL DATA:  Chest pain EXAM: CHEST  2 VIEW COMPARISON:  06/30/2009 FINDINGS: Normal heart size and mediastinal contours. There is no edema, consolidation, effusion, or pneumothorax. No acute osseous finding. EKG leads create artifact across the chest on the AP view. IMPRESSION: Negative chest. Electronically Signed   By: Monte Fantasia M.D.   On: 04/10/2017 22:28    Procedures Procedures (including critical care time)  Medications Ordered in ED Medications  0.9 %  sodium chloride infusion ( Intravenous New Bag/Given 04/10/17 2204)  ondansetron (ZOFRAN) injection 4 mg (4 mg Intravenous Given 04/10/17 2207)  pantoprazole (PROTONIX) injection 40 mg (40 mg Intravenous Given 04/10/17 2207)  iopamidol (ISOVUE-300) 61 % injection 100 mL (100 mLs Intravenous Contrast Given 04/11/17 0032)     Initial Impression / Assessment and Plan / ED Course  I have reviewed the triage vital signs and the nursing notes.  Pertinent labs & imaging results that were available during my care of the patient were reviewed by me and considered in my medical decision making (see chart for details).     Patient initial concern was for the chest pain.  Initial troponin negative chest x-ray without any acute findings.  However troponin was drawn shortly after the onset of the symptoms.  Symptoms somewhat atypical and that it is a burning sensation.  EKG without any acute changes.  Labs including belly labs without any significant CT scan of abdomen ordered.  For further evaluation.  Patient did stay at home he felt as if he was going to pass out but he has not passed out.  Cardiac monitoring here without any arrhythmias.  Repeat troponin has been ordered for 1:30 AM.  Patient's chest pain very atypical for cardiac.  Final Clinical Impressions(s) / ED Diagnoses   Final diagnoses:    Precordial pain  Right upper quadrant abdominal pain    ED Discharge Orders    None       Fredia Sorrow, MD 04/11/17 0045

## 2017-04-11 NOTE — Discharge Instructions (Signed)
Take the Prilosec as prescribed.  Avoid fried, spicy, or greasy foods.  You should consider seeing a gastroenterologist for your chronic abdominal complaints, you can call Dr. Olevia Perches office to get an appointment.  Also because you are smoking and have a family history of heart disease you should consider seeing a cardiologist, call that number to get an appointment to see one of the cardiologists.  You should return to the ED if you feel like you are getting worse.

## 2017-04-11 NOTE — ED Provider Notes (Signed)
3:30 AM patient left at change of shift to get delta troponin and results of his CT scan.  Patient states he is feeling better.  He states he had acute onset of a burning sensation on that started on the right side of his body and went down into his right arm.  He states he had some near syncope.  He states the burning sensation felt like it was deep inside his body, he states it lasted about an hour.  He denies having any pain at all.  He states he feels better now and the discomfort is gone.  He does smoke and states he has a history of coronary artery disease in his family.  He states he used to drink heavily and he has chronic abdominal problems.  He is interested in following up with a cardiologist and a gastroenterologist.  He was discharged home on Prilosec.   Rolland Porter, MD 04/11/17 [phone]

## 2017-04-15 ENCOUNTER — Encounter: Payer: Self-pay | Admitting: Physician Assistant

## 2017-04-22 ENCOUNTER — Ambulatory Visit: Payer: BLUE CROSS/BLUE SHIELD | Admitting: Physician Assistant

## 2017-04-26 ENCOUNTER — Ambulatory Visit: Payer: BLUE CROSS/BLUE SHIELD | Admitting: Physician Assistant

## 2017-04-26 ENCOUNTER — Encounter: Payer: Self-pay | Admitting: Physician Assistant

## 2017-04-26 VITALS — BP 124/76 | HR 78 | Ht 74.0 in | Wt 176.4 lb

## 2017-04-26 DIAGNOSIS — Z86010 Personal history of colonic polyps: Secondary | ICD-10-CM

## 2017-04-26 DIAGNOSIS — R1013 Epigastric pain: Secondary | ICD-10-CM | POA: Diagnosis not present

## 2017-04-26 DIAGNOSIS — K219 Gastro-esophageal reflux disease without esophagitis: Secondary | ICD-10-CM | POA: Diagnosis not present

## 2017-04-26 DIAGNOSIS — R110 Nausea: Secondary | ICD-10-CM

## 2017-04-26 DIAGNOSIS — R1011 Right upper quadrant pain: Secondary | ICD-10-CM

## 2017-04-26 MED ORDER — NA SULFATE-K SULFATE-MG SULF 17.5-3.13-1.6 GM/177ML PO SOLN: 1.0000 | ORAL | 0 refills | Status: DC

## 2017-04-26 MED ORDER — PANTOPRAZOLE SODIUM 40 MG PO TBEC: 40.0000 mg | DELAYED_RELEASE_TABLET | Freq: Two times a day (BID) | ORAL | 3 refills | Status: DC

## 2017-04-26 NOTE — Patient Instructions (Addendum)
Pantoprazole 40 mg twice a day 30-60 mins before breakfast and dinner.    You have been scheduled for an endoscopy and colonoscopy. Please follow the written instructions given to you at your visit today. Please pick up your prep supplies at the pharmacy within the next 1-3 days. If you use inhalers (even only as needed), please bring them with you on the day of your procedure. Your physician has requested that you go to www.startemmi.com and enter the access code given to you at your visit today. This web site gives a general overview about your procedure. However, you should still follow specific instructions given to you by our office regarding your preparation for the procedure.

## 2017-04-26 NOTE — Progress Notes (Addendum)
Chief Complaint: "Warm feelings in my stomach"  HPI:    Kenneth Gilbert is a 51 year old male with a past medical history of alcohol abuse, who presents to clinic today, as a referral from [place], after recently being seen in the ED for precordial pain and a complaint of "warm feeling in my stomach".    Patient was seen in the ED on 04/10/17 and described acute onset of right upper quadrant abdominal pain which radiated into his chest and down into his right arm was described as a "burning sensation".  Labs at that time showed a normal CMP and a CBC with minimally decreased platelets at 149.  Lipase was normal as well as troponin.  EKG and chest x-ray were normal.  Patient had CT the abdomen pelvis no CT evidence of intra-abdominal abnormality.  He had an ectatic infrarenal abdominal aorta up to 2.5 cm, as well as punctate nonobstructing stone in the left kidney.  Patient was discharged home on Prilosec.    Today, the patient presents to clinic and explains that he has constant epigastric/right upper quadrant abdominal pain which he describes as "burning and cramping".  He was taking Omeprazole 20 mg daily for this but it stopped working about 6 months ago.  Patient tells me he was seen by a GI physician in town about 2 years ago and thought they were going to schedule him for an EGD and colonoscopy, but instead they just proceeded with a colonoscopy.  Patient reports that he had multiple polyps removed and in fact they "had to leave a few in".  He was told to repeat his colonoscopy in 1 year.    Patient tells me that currently he has a constant epigastric 5-6/10 pain which sometimes increases.  He blames this on "all my alcohol use over the years".  Apparently he used to drink 1/5 of liquor a day and just stopped doing this about 10 years ago.  Currently, he only drinks "a couple of beers watching the game".  Patient tells me he also has associated nausea which is worse if he has a completely empty  stomach.  Patient was told to restart his Omeprazole 20 mg when he went to the ER but tells me that "this never worked so I just never started again".  Patient has also been using Ibuprofen at least 3-4 times a day for "years" for back pain.  He was recently told to stop this when he went to the ER and he has not taken any since.    Patient denies fever, chills, blood in his stool, melena, weight loss, anorexia, vomiting, or symptoms that awaken him at night.  Past Medical History:  Diagnosis Date  . ETOH abuse     Past Surgical History:  Procedure Laterality Date  . arm surgery      Current Outpatient Medications  Medication Sig Dispense Refill  . ibuprofen (ADVIL,MOTRIN) 200 MG tablet Take 400 mg by mouth every 6 (six) hours as needed for pain or headache.    [name] omeprazole (PRILOSEC) 20 MG capsule Take 1 po BID x 2 weeks then once a day 60 capsule 0   No current facility-administered medications for this visit.     Allergies as of 04/26/2017 - Review Complete 04/10/2017  Allergen Reaction Noted  . Codeine  08/18/2009    No family history on file.  Social History   Socioeconomic History  . Marital status: Divorced    Spouse name: Not on file  . Number  of children: Not on file  . Years of education: Not on file  . Highest education level: Not on file  Social Needs  . Financial resource strain: Not on file  . Food insecurity - worry: Not on file  . Food insecurity - inability: Not on file  . Transportation needs - medical: Not on file  . Transportation needs - non-medical: Not on file  Occupational History  . Not on file  Tobacco Use  . Smoking status: Current Every Day Smoker    Packs/day: 0.50    Types: Cigarettes  . Smokeless tobacco: Never Used  Substance and Sexual Activity  . Alcohol use: Yes    Comment: occasionally  . Drug use: Yes    Types: Marijuana  . Sexual activity: Not on file  Other Topics Concern  . Not on file  Social History Narrative  . Not  on file    Review of Systems:    Constitutional: No weight loss, fever or chills Skin: No rash  Cardiovascular: No chest pain Respiratory: No SOB  Gastrointestinal: See HPI and otherwise negative Genitourinary: No dysuria  Neurological: No headache, dizziness or syncope Musculoskeletal: No new muscle or joint pain Hematologic: No bleeding Psychiatric: No history of depression or anxiety   Physical Exam:  Vital signs: BP 124/76   Pulse 78   Ht 6\' 2"  (1.88 m)   Wt 176 lb 6 oz (80 kg)   BMI 22.65 kg/m   Constitutional:   Pleasant male appears to be in NAD, Well developed, Well nourished, alert and cooperative Head:  Normocephalic and atraumatic. Eyes:   PEERL, EOMI. No icterus. Conjunctiva pink. Ears:  Normal auditory acuity. Neck:  Supple Throat: Oral cavity and pharynx without inflammation, swelling or lesion.  Respiratory: Respirations even and unlabored. Lungs clear to auscultation bilaterally.   No wheezes, crackles, or rhonchi.  Cardiovascular: Normal S1, S2. No MRG. Regular rate and rhythm. No peripheral edema, cyanosis or pallor.  Gastrointestinal:  Soft, nondistended, mild epigastric/ RUQ ttp, No rebound or guarding. Normal bowel sounds. No appreciable masses or hepatomegaly. Rectal:  Not performed.  Msk:  Symmetrical without gross deformities. Without edema, no deformity or joint abnormality.  Neurologic:  Alert and  oriented x4;  grossly normal neurologically.  Skin:   Dry and intact without significant lesions or rashes. Psychiatric:  Demonstrates good judgement and reason without abnormal affect or behaviors.  RELEVANT LABS AND IMAGING: CBC    Component Value Date/Time   WBC 7.5 04/10/2017 2236   RBC 4.40 04/10/2017 2236   HGB 14.2 04/10/2017 2236   HCT 42.8 04/10/2017 2236   PLT 149 (L) 04/10/2017 2236   MCV 97.3 04/10/2017 2236   MCH 32.3 04/10/2017 2236   MCHC 33.2 04/10/2017 2236   RDW 12.5 04/10/2017 2236   LYMPHSABS 2.5 04/10/2017 2236   MONOABS 0.5  04/10/2017 2236   EOSABS 0.1 04/10/2017 2236   BASOSABS 0.0 04/10/2017 2236    CMP     Component Value Date/Time   NA 138 04/10/2017 2236   K 3.6 04/10/2017 2236   CL 110 04/10/2017 2236   CO2 23 04/10/2017 2236   GLUCOSE 95 04/10/2017 2236   BUN 13 04/10/2017 2236   CREATININE 0.64 04/10/2017 2236   CALCIUM 8.3 (L) 04/10/2017 2236   PROT 6.3 (L) 04/10/2017 2236   ALBUMIN 3.8 04/10/2017 2236   AST 12 (L) 04/10/2017 2236   ALT 9 (L) 04/10/2017 2236   ALKPHOS 68 04/10/2017 2236   BILITOT 0.8 04/10/2017  [place] 04/10/2017 2236   GFRAA >60 04/10/2017 2236   EXAM: CT ABDOMEN AND PELVIS WITH CONTRAST 04/10/17  TECHNIQUE: Multidetector CT imaging of the abdomen and pelvis was performed using the standard protocol following bolus administration of intravenous contrast.  CONTRAST:  156mL ISOVUE-300 IOPAMIDOL (ISOVUE-300) INJECTION 61%  COMPARISON:  04/17/2010  FINDINGS: Lower chest: No acute abnormality.  Hepatobiliary: No focal liver abnormality is seen. No gallstones, gallbladder wall thickening, or biliary dilatation.  Pancreas: Unremarkable. No pancreatic ductal dilatation or surrounding inflammatory changes.  Spleen: Normal in size without focal abnormality.  Adrenals/Urinary Tract: Acute glands are within normal limits. Stable prominent left extrarenal pelvis. Punctate nonobstructing stone in the mid left kidney. The bladder is normal.  Stomach/Bowel: Stomach is within normal limits. Appendix appears normal. No evidence of bowel wall thickening, distention, or inflammatory changes.  Vascular/Lymphatic: Moderate atherosclerotic calcification. Ectatic infrarenal abdominal aorta measuring up to 2.5 cm. No significantly enlarged lymph nodes  Reproductive: Prostate is unremarkable.  Other: No abdominal wall hernia or abnormality. No abdominopelvic ascites.  Musculoskeletal: No acute or suspicious lesion. Degenerative  changes .  IMPRESSION: 1. No CT evidence for acute intra-abdominal abnormality. 2. Ectatic infrarenal abdominal aorta up to 2.5 cm. Ectatic abdominal aorta at risk for aneurysm development. Recommend followup by ultrasound in 5 years. This recommendation follows ACR consensus guidelines: White Paper of the ACR Incidental Findings Committee II on Vascular Findings. J Am Coll Radiol 2013; 10:789-794. 3. Punctate nonobstructing stone in the left kidney   Electronically Signed   By: Donavan Foil M.D.   On: 04/11/2017 00:54  Assessment: 1.  Epigastric/right upper quadrant pain: For the past 3-5 years, used to drink a fifth of liquor a day, stopped drinking 10 years ago, also with NSAID abuse with 3-4 ibuprofen 200 mg daily over the past "many years" ; consider gastritis versus PUD versus other  2.  GERD: With above 3.  Nausea: With above 4.  History of multiple polyps: Patient describes a colonoscopy 2 years ago in [place], we will try to get records, he reports that he had multiple polyps removed "and they left some in".  Apparently he was supposed to repeat in a yr  Plan: 1.  Discussed with patient that if he did have a colonoscopy 2 years ago with multiple polyps and they did not remove them all, then he would need a repeat.  We will try to get his records from previous GI physician in town. 2.  Martin Majestic ahead and scheduled patient for an EGD and colonoscopy in [place] with Dr. Hilarie Fredrickson.  Did discuss risk, benefits, limitations and alternatives and the patient agrees to proceed.  If we receive records from previous colonoscopy and patient does not need repeat, then we will cancel this procedure. 3.  Started the patient on Pantoprazole 40 mg twice daily, 30-60 minutes before breakfast and dinner. 4.  Reviewed antireflux diet and lifestyle modifications.  Encouraged patient to continue his abstinence from NSAIDs and alcohol. 5.  Patient to follow in clinic per recommendations from Dr. Hilarie Fredrickson  after time of procedures.  Kenneth Newer, PA-C [facility] 04/26/2017, 2:55 PM  Addendum: Reviewed and agree with initial management. Once colonoscopy records obtained, please review and let me know if anything unexpected Kenneth Gilbert, Kenneth Lines, Kenneth Gilbert  Addendum: 05/01/17 1603 Received records from patient's previous colonoscopy with Dr. Benson Norway 09/14/14.  There was a finding of 13 sessile polyps in the sigmoid colon, descending colon, transverse colon and at the hepatic flexure as  well as ascending colon and cecum.  The polyps were 3-10 mm in size.  It was recommended patient repeat colonoscopy in a year for surveillance.  Pathology revealed fragments of tubular adenoma, sessile serrated adenoma and hyperplastic polyps.  Patient also had an EGD performed 09/14/14 due to epigastric abdominal pain.  There was a finding of LA grade a esophagitis with no bleeding, patchy mild inflammation characterized by erosions and erythema in the gastric antrum and diffuse mild inflammation characterized by erythema in the duodenal bulb.  Pathology revealed reactive gastropathy in the antrum.  No change to recommendations, continue as scheduled with EGD and Colonoscopy.  Kenneth Newer, Pa-C

## 2017-05-24 ENCOUNTER — Encounter: Payer: Self-pay | Admitting: Internal Medicine

## 2017-05-28 ENCOUNTER — Telehealth: Payer: Self-pay | Admitting: Internal Medicine

## 2017-05-28 NOTE — Telephone Encounter (Signed)
Pay no more than $50 voucher given to pharmacist at [facility]. Confirmed rx will be 50 dollars. Patient advised.

## 2017-05-30 ENCOUNTER — Other Ambulatory Visit: Payer: Self-pay

## 2017-05-30 ENCOUNTER — Ambulatory Visit (AMBULATORY_SURGERY_CENTER): Payer: BLUE CROSS/BLUE SHIELD | Admitting: Internal Medicine

## 2017-05-30 ENCOUNTER — Encounter: Payer: Self-pay | Admitting: Internal Medicine

## 2017-05-30 VITALS — BP 119/74 | HR 60 | Temp 97.8°F | Resp 10 | Ht 74.0 in | Wt 176.0 lb

## 2017-05-30 DIAGNOSIS — D120 Benign neoplasm of cecum: Secondary | ICD-10-CM | POA: Diagnosis not present

## 2017-05-30 DIAGNOSIS — D122 Benign neoplasm of ascending colon: Secondary | ICD-10-CM | POA: Diagnosis not present

## 2017-05-30 DIAGNOSIS — R1013 Epigastric pain: Secondary | ICD-10-CM | POA: Diagnosis not present

## 2017-05-30 DIAGNOSIS — D123 Benign neoplasm of transverse colon: Secondary | ICD-10-CM | POA: Diagnosis not present

## 2017-05-30 DIAGNOSIS — Z86010 Personal history of colonic polyps: Secondary | ICD-10-CM

## 2017-05-30 DIAGNOSIS — K2980 Duodenitis without bleeding: Secondary | ICD-10-CM | POA: Diagnosis not present

## 2017-05-30 MED ORDER — SODIUM CHLORIDE 0.9 % IV SOLN: 500.0000 mL | Freq: Once | INTRAVENOUS | Status: DC

## 2017-05-30 NOTE — Op Note (Signed)
[facility] Patient Name: Kenneth Gilbert Procedure Date: 05/30/2017 2:12 PM MRN: [id number] Endoscopist: Jerene Bears , MD Age: 52 Referring MD:  Date of Birth: 08-Apr-1966 Gender: Male Account #: [id number] Procedure:                Upper GI endoscopy Indications:              Epigastric abdominal pain Medicines:                Monitored Anesthesia Care Procedure:                Pre-Anesthesia Assessment:                           - Prior to the procedure, a History and Physical                            was performed, and patient medications and                            allergies were reviewed. The patient's tolerance of                            previous anesthesia was also reviewed. The risks                            and benefits of the procedure and the sedation                            options and risks were discussed with the patient.                            All questions were answered, and informed consent                            was obtained. Prior Anticoagulants: The patient has                            taken no previous anticoagulant or antiplatelet                            agents. ASA Grade Assessment: II - A patient with                            mild systemic disease. After reviewing the risks                            and benefits, the patient was deemed in                            satisfactory condition to undergo the procedure.                           After obtaining informed consent, the endoscope was  passed under direct vision. Throughout the                            procedure, the patient's blood pressure, pulse, and                            oxygen saturations were monitored continuously. The                            Endoscope was introduced through the mouth, and                            advanced to the second part of duodenum. The upper                            GI endoscopy was accomplished  without difficulty.                            The patient tolerated the procedure well. Scope In: Scope Out: Findings:                 The examined esophagus was normal. Z-line regular                            at 46 cm.                           The entire examined stomach was normal. Biopsies                            were taken with a cold forceps for histology and                            Helicobacter pylori testing (body, antrum,                            incisura).                           Localized moderate inflammation characterized by                            erythema was found in the duodenal bulb.                           The second portion of the duodenum was normal. Complications:            No immediate complications. Estimated Blood Loss:     Estimated blood loss was minimal. Impression:               - Normal esophagus.                           - Normal stomach. Biopsied.                           - Duodenitis.                           -  Normal second portion of the duodenum. Recommendation:           - Patient has a contact number available for                            emergencies. The signs and symptoms of potential                            delayed complications were discussed with the                            patient. Return to normal activities tomorrow.                            Written discharge instructions were provided to the                            patient.                           - Resume previous diet.                           - Continue present medications. Continue                            pantoprazole 40 mg once daily.                           - Await pathology results. Jerene Bears, MD 05/30/2017 2:49:22 PM This report has been signed electronically.

## 2017-05-30 NOTE — Patient Instructions (Signed)
YOU HAD AN ENDOSCOPIC PROCEDURE TODAY AT [facility]:   Refer to the procedure report that was given to you for any specific questions about what was found during the examination.  If the procedure report does not answer your questions, please call your gastroenterologist to clarify.  If you requested that your care partner not be given the details of your procedure findings, then the procedure report has been included in a sealed envelope for you to review at your convenience later.  YOU SHOULD EXPECT: Some feelings of bloating in the abdomen. Passage of more gas than usual.  Walking can help get rid of the air that was put into your GI tract during the procedure and reduce the bloating. If you had a lower endoscopy (such as a colonoscopy or flexible sigmoidoscopy) you may notice spotting of blood in your stool or on the toilet paper. If you underwent a bowel prep for your procedure, you may not have a normal bowel movement for a few days.  Please Note:  You might notice some irritation and congestion in your nose or some drainage.  This is from the oxygen used during your procedure.  There is no need for concern and it should clear up in a day or so.  SYMPTOMS TO REPORT IMMEDIATELY:   Following lower endoscopy (colonoscopy or flexible sigmoidoscopy):  Excessive amounts of blood in the stool  Significant tenderness or worsening of abdominal pains  Swelling of the abdomen that is new, acute  Fever of 100F or higher   Following upper endoscopy (EGD)  Vomiting of blood or coffee ground material  New chest pain or pain under the shoulder blades  Painful or persistently difficult swallowing  New shortness of breath  Fever of 100F or higher  Black, tarry-looking stools  For urgent or emergent issues, a gastroenterologist can be reached at any hour by calling [phone].   DIET:  We do recommend a small meal at first, but then you may proceed to your regular diet.  Drink  plenty of fluids but you should avoid alcoholic beverages for 24 hours. Avoid high acidic foods and beverages.    Take your medication.  Try to avoid alcohol. Cut way back on smoking.  ACTIVITY:  You should plan to take it easy for the rest of today and you should NOT DRIVE or use heavy machinery until tomorrow (because of the sedation medicines used during the test).    FOLLOW UP: Our staff will call the number listed on your records the next business day following your procedure to check on you and address any questions or concerns that you may have regarding the information given to you following your procedure. If we do not reach you, we will leave a message.  However, if you are feeling well and you are not experiencing any problems, there is no need to return our call.  We will assume that you have returned to your regular daily activities without incident.  If any biopsies were taken you will be contacted by phone or by letter within the next 1-3 weeks.  Please call us at [phone] if you have not heard about the biopsies in 3 weeks.    SIGNATURES/CONFIDENTIALITY: You and/or your care partner have signed paperwork which will be entered into your electronic medical record.  These signatures attest to the fact that that the information above on your After Visit Summary has been reviewed and is understood.  Full responsibility of the confidentiality  of this discharge information lies with you and/or your care-partner.  Be sure to take your omeprazole 1/2 hour before breakfast every day.   Watch your diet.

## 2017-05-30 NOTE — Progress Notes (Signed)
Report given to PACU, vss 

## 2017-05-30 NOTE — Progress Notes (Signed)
Called to room to assist during endoscopic procedure.  Patient ID and intended procedure confirmed with present staff. Received instructions for my participation in the procedure from the performing physician.  

## 2017-05-30 NOTE — Op Note (Signed)
[facility] Patient Name: Kenneth Gilbert Procedure Date: 05/30/2017 2:11 PM MRN: [id number] Endoscopist: Jerene Bears , MD Age: 52 Referring MD:  Date of Birth: 10-05-1965 Gender: Male Account #: [id number] Procedure:                Colonoscopy Indications:              Surveillance: History of numerous (> 10) adenomas                            on last colonoscopy (< 3 yrs) Medicines:                Monitored Anesthesia Care Procedure:                Pre-Anesthesia Assessment:                           - Prior to the procedure, a History and Physical                            was performed, and patient medications and                            allergies were reviewed. The patient's tolerance of                            previous anesthesia was also reviewed. The risks                            and benefits of the procedure and the sedation                            options and risks were discussed with the patient.                            All questions were answered, and informed consent                            was obtained. Prior Anticoagulants: The patient has                            taken no previous anticoagulant or antiplatelet                            agents. ASA Grade Assessment: II - A patient with                            mild systemic disease. After reviewing the risks                            and benefits, the patient was deemed in                            satisfactory condition to undergo the procedure.  After obtaining informed consent, the colonoscope                            was passed under direct vision. Throughout the                            procedure, the patient's blood pressure, pulse, and                            oxygen saturations were monitored continuously. The                            Colonoscope was introduced through the anus and                            advanced to the the cecum,  identified by                            appendiceal orifice and ileocecal valve. The                            colonoscopy was performed without difficulty. The                            patient tolerated the procedure well. The quality                            of the bowel preparation was good. The ileocecal                            valve, appendiceal orifice, and rectum were                            photographed. Scope In: 2:25:25 PM Scope Out: 2:42:57 PM Scope Withdrawal Time: 0 hours 15 minutes 39 seconds  Total Procedure Duration: 0 hours 17 minutes 32 seconds  Findings:                 The digital rectal exam was normal.                           A 5 mm polyp was found in the cecum. The polyp was                            sessile. The polyp was removed with a cold snare.                            Resection and retrieval were complete.                           Two sessile polyps were found in the cecum. The                            polyps were 2 to 3 mm in size. These  polyps were                            removed with a cold biopsy forceps. Resection and                            retrieval were complete.                           Three sessile polyps were found in the ascending                            colon. The polyps were 4 to 7 mm in size. These                            polyps were removed with a cold snare. Resection                            and retrieval were complete.                           Three sessile polyps were found in the transverse                            colon. The polyps were 4 to 6 mm in size. These                            polyps were removed with a cold snare. Resection                            and retrieval were complete.                           The retroflexed view of the distal rectum and anal                            verge was normal and showed no anal or rectal                            abnormalities. Complications:             No immediate complications. Estimated Blood Loss:     Estimated blood loss was minimal. Impression:               - One 5 mm polyp in the cecum, removed with a cold                            snare. Resected and retrieved.                           - Two 2 to 3 mm polyps in the cecum, removed with a                            cold biopsy forceps. Resected and retrieved.                           -  Three 4 to 7 mm polyps in the ascending colon,                            removed with a cold snare. Resected and retrieved.                           - Three 4 to 6 mm polyps in the transverse colon,                            removed with a cold snare. Resected and retrieved. Recommendation:           - Patient has a contact number available for                            emergencies. The signs and symptoms of potential                            delayed complications were discussed with the                            patient. Return to normal activities tomorrow.                            Written discharge instructions were provided to the                            patient.                           - Resume previous diet.                           - Continue present medications.                           - Await pathology results.                           - Repeat colonoscopy is recommended for                            surveillance. The colonoscopy date will be                            determined after pathology results from today's                            exam become available for review. Jerene Bears, MD 05/30/2017 2:52:06 PM This report has been signed electronically.

## 2017-05-31 ENCOUNTER — Telehealth: Payer: Self-pay | Admitting: *Deleted

## 2017-05-31 NOTE — Telephone Encounter (Signed)
  Follow up Call-  Call back number 05/30/2017  Post procedure Call Back phone  # [phone]  Permission to leave phone message No  comments VM not set up  Some recent data might be hidden    No answer, permission not given to leave message

## 2017-05-31 NOTE — Telephone Encounter (Signed)
Attempted post procedure follow up call.  No answer.  Will attempt to call again this afternoon.   

## 2017-05-31 NOTE — Telephone Encounter (Signed)
Patient called office back states he is doing okay after having colonoscopy.

## 2017-06-03 ENCOUNTER — Telehealth: Payer: Self-pay | Admitting: General Practice

## 2017-06-03 NOTE — Telephone Encounter (Signed)
Yes, I will see him.

## 2017-06-03 NOTE — Telephone Encounter (Signed)
Pt informed and appt made.

## 2017-06-03 NOTE — Telephone Encounter (Signed)
Copied from [place] [id number]. Topic: Appointment Scheduling - Scheduling Inquiry for Clinic >> May 31, 2017  8:57 AM Antonieta Iba C wrote: Reason for CRM: pt called in to establish care with a provider. Pt says that he had a procedure completed and was advised that he need to establish care with a PCP. The [facility] location was suggested to pt. Pt would like to know if Dr. Jenny Reichmann or Dr. Ronnald Ramp would take him on as a new patient?    Please advise

## 2017-06-05 ENCOUNTER — Encounter: Payer: Self-pay | Admitting: Internal Medicine

## 2017-06-11 ENCOUNTER — Encounter: Payer: Self-pay | Admitting: Internal Medicine

## 2017-06-11 ENCOUNTER — Other Ambulatory Visit (INDEPENDENT_AMBULATORY_CARE_PROVIDER_SITE_OTHER): Payer: BLUE CROSS/BLUE SHIELD

## 2017-06-11 ENCOUNTER — Ambulatory Visit: Payer: BLUE CROSS/BLUE SHIELD | Admitting: Internal Medicine

## 2017-06-11 VITALS — BP 120/70 | HR 70 | Temp 97.9°F | Ht 74.0 in | Wt 173.2 lb

## 2017-06-11 DIAGNOSIS — K551 Chronic vascular disorders of intestine: Secondary | ICD-10-CM

## 2017-06-11 DIAGNOSIS — Z1159 Encounter for screening for other viral diseases: Secondary | ICD-10-CM

## 2017-06-11 DIAGNOSIS — Z0000 Encounter for general adult medical examination without abnormal findings: Secondary | ICD-10-CM | POA: Diagnosis not present

## 2017-06-11 DIAGNOSIS — Z23 Encounter for immunization: Secondary | ICD-10-CM

## 2017-06-11 DIAGNOSIS — R1033 Periumbilical pain: Secondary | ICD-10-CM | POA: Diagnosis not present

## 2017-06-11 DIAGNOSIS — R42 Dizziness and giddiness: Secondary | ICD-10-CM | POA: Diagnosis not present

## 2017-06-11 DIAGNOSIS — Z720 Tobacco use: Secondary | ICD-10-CM | POA: Insufficient documentation

## 2017-06-11 DIAGNOSIS — Z0001 Encounter for general adult medical examination with abnormal findings: Secondary | ICD-10-CM | POA: Diagnosis not present

## 2017-06-11 DIAGNOSIS — E785 Hyperlipidemia, unspecified: Secondary | ICD-10-CM | POA: Diagnosis not present

## 2017-06-11 LAB — CBC WITH DIFFERENTIAL/PLATELET
BASOS PCT: 0.7 % (ref 0.0–3.0)
Basophils Absolute: 0 10*3/uL (ref 0.0–0.1)
EOS PCT: 0.9 % (ref 0.0–5.0)
Eosinophils Absolute: 0.1 10*3/uL (ref 0.0–0.7)
HEMATOCRIT: 45.9 % (ref 39.0–52.0)
Hemoglobin: 15.7 g/dL (ref 13.0–17.0)
LYMPHS PCT: 38.5 % (ref 12.0–46.0)
Lymphs Abs: 2.2 10*3/uL (ref 0.7–4.0)
MCHC: 34.1 g/dL (ref 30.0–36.0)
MCV: 95.8 fl (ref 78.0–100.0)
MONOS PCT: 6.4 % (ref 3.0–12.0)
Monocytes Absolute: 0.4 10*3/uL (ref 0.1–1.0)
NEUTROS ABS: 3 10*3/uL (ref 1.4–7.7)
Neutrophils Relative %: 53.5 % (ref 43.0–77.0)
PLATELETS: 156 10*3/uL (ref 150.0–400.0)
RBC: 4.8 Mil/uL (ref 4.22–5.81)
RDW: 13.3 % (ref 11.5–15.5)
WBC: 5.6 10*3/uL (ref 4.0–10.5)

## 2017-06-11 LAB — URINALYSIS, ROUTINE W REFLEX MICROSCOPIC
Bilirubin Urine: NEGATIVE
Hgb urine dipstick: NEGATIVE
Ketones, ur: NEGATIVE
Leukocytes, UA: NEGATIVE
Nitrite: NEGATIVE
RBC / HPF: NONE SEEN (ref 0–?)
SPECIFIC GRAVITY, URINE: 1.02 (ref 1.000–1.030)
TOTAL PROTEIN, URINE-UPE24: NEGATIVE
URINE GLUCOSE: NEGATIVE
Urobilinogen, UA: 1 (ref 0.0–1.0)
pH: 7.5 (ref 5.0–8.0)

## 2017-06-11 LAB — LIPID PANEL
CHOL/HDL RATIO: 5
Cholesterol: 177 mg/dL (ref 0–200)
HDL: 33 mg/dL — ABNORMAL LOW (ref >39.00)
LDL Cholesterol: 125 mg/dL — ABNORMAL HIGH (ref 0–99)
NONHDL: 143.7
Triglycerides: 94 mg/dL (ref 0.0–149.0)
VLDL: 18.8 mg/dL (ref 0.0–40.0)

## 2017-06-11 LAB — COMPREHENSIVE METABOLIC PANEL
ALBUMIN: 4.5 g/dL (ref 3.5–5.2)
ALK PHOS: 66 U/L (ref 39–117)
ALT: 10 U/L (ref 0–53)
AST: 13 U/L (ref 0–37)
BUN: 13 mg/dL (ref 6–23)
CALCIUM: 9.3 mg/dL (ref 8.4–10.5)
CHLORIDE: 106 meq/L (ref 96–112)
CO2: 30 mEq/L (ref 19–32)
Creatinine, Ser: 0.78 mg/dL (ref 0.40–1.50)
GFR: 110.59 mL/min (ref >60.00)
Glucose, Bld: 96 mg/dL (ref 70–99)
POTASSIUM: 4.4 meq/L (ref 3.5–5.1)
Sodium: 142 mEq/L (ref 135–145)
TOTAL PROTEIN: 6.8 g/dL (ref 6.0–8.3)
Total Bilirubin: 0.6 mg/dL (ref 0.2–1.2)

## 2017-06-11 LAB — AMYLASE: AMYLASE: 34 U/L (ref 27–131)

## 2017-06-11 LAB — PSA: PSA: 0.73 ng/mL (ref 0.10–4.00)

## 2017-06-11 LAB — LIPASE: Lipase: 16 U/L (ref 11.0–59.0)

## 2017-06-11 NOTE — Patient Instructions (Signed)

## 2017-06-11 NOTE — Progress Notes (Signed)
Subjective:  Patient ID: Kenneth Gilbert, male    DOB: 04-13-1966  Age: 52 y.o. MRN: [id number]  CC: Abdominal Pain and Annual Exam  NEW TO ME  HPI JASN XIA presents for establishing a new primary care relationship.  He is due for a physical.  He complains of chronic abdominal pain in the pit of his stomach that occurs after eating.  He has had upper and lower endoscopies by GI with no etiology for the symptoms being found.  He has tried a PPI without any improvement.  He has about a 60-pack-year history of tobacco abuse.  He also smokes marijuana.  He is a recovered alcoholic and has abstained from alcohol for many years now.  He describes the pain as intermittently sharp and dull.  He had a plain CT scan done about 3 or 4 months ago that was remarkable for IMPRESSION: 1. No CT evidence for acute intra-abdominal abnormality. 2. Ectatic infrarenal abdominal aorta up to 2.5 cm. Ectatic abdominal aorta at risk for aneurysm development. Recommend followup by ultrasound in 5 years. This recommendation follows ACR consensus guidelines: White Paper of the ACR Incidental Findings Committee II on Vascular Findings. J Am Coll Radiol 2013; 10:789-794. 3. Punctate nonobstructing stone in the left kidney.  It was not a CT angio so the mesenteric arteries were not evaluated.  He complains of intermittent nausea but denies vomiting.  He also complains of a several year history of intermittent dizziness and lightheadedness.  He has never had syncope.  He denies chest pain, DOE, shortness of breath, chronic cough, or hemoptysis.  History [place] has a past medical history of Arthritis, ETOH abuse, and Hyperlipidemia.   He has a past surgical history that includes arm surgery (Right); arm surgery (Left); Shoulder surgery (Left); and Inner ear surgery.   His family history includes Cancer in his father; Cirrhosis in his sister; Heart disease in his brother.He reports that he has been smoking  cigarettes.  He started smoking about 39 years ago. He has been smoking about 1.50 packs per day. he has never used smokeless tobacco. He reports that he uses drugs. Drug: Marijuana. He reports that he does not drink alcohol.  Outpatient Medications Prior to Visit  Medication Sig Dispense Refill  . ibuprofen (ADVIL,MOTRIN) 200 MG tablet Take 400 mg by mouth every 6 (six) hours as needed for pain or headache.    . pantoprazole (PROTONIX) 40 MG tablet Take 1 tablet (40 mg total) by mouth 2 (two) times daily before a meal. 90 tablet 3  . 0.9 %  sodium chloride infusion      No facility-administered medications prior to visit.     ROS Review of Systems  Constitutional: Positive for unexpected weight change. Negative for activity change, appetite change, diaphoresis and fatigue.  HENT: Negative.  Negative for trouble swallowing.   Eyes: Negative for visual disturbance.  Respiratory: Negative for cough, chest tightness, shortness of breath and wheezing.   Cardiovascular: Negative for chest pain, palpitations and leg swelling.  Gastrointestinal: Positive for abdominal pain and nausea. Negative for blood in stool, constipation, diarrhea, rectal pain and vomiting.  Endocrine: Negative.   Genitourinary: Negative.  Negative for difficulty urinating, discharge, dysuria, penile pain, penile swelling, scrotal swelling, testicular pain and urgency.  Musculoskeletal: Negative.   Skin: Negative.  Negative for color change and rash.  Allergic/Immunologic: Negative.   Neurological: Positive for dizziness and light-headedness. Negative for weakness and headaches.  Hematological: Negative for adenopathy. Does not bruise/bleed easily.  Psychiatric/Behavioral: Negative.     Objective:  BP 120/70 (BP Location: Left Arm, Patient Position: Sitting, Cuff Size: Normal)   Pulse 70   Temp 97.9 F (36.6 C) (Oral)   Ht 6\' 2"  (1.88 m)   Wt 173 lb 4 oz (78.6 kg)   SpO2 99%   BMI 22.24 kg/m   Physical Exam    Constitutional: He is oriented to person, place, and time.  Non-toxic appearance. He does not have a sickly appearance. He appears ill. No distress.  Thin male who appears older than his stated age  HENT:  Mouth/Throat: Oropharynx is clear and moist. No oropharyngeal exudate.  Eyes: Conjunctivae are normal. Left eye exhibits no discharge. No scleral icterus.  Neck: Normal range of motion. Neck supple. No JVD present. No thyromegaly present.  Cardiovascular: Normal rate, regular rhythm and normal heart sounds. Exam reveals no gallop.  No murmur heard. EKG ---  Sinus  Rhythm  WITHIN NORMAL LIMITS  Pulmonary/Chest: Effort normal and breath sounds normal. No respiratory distress. He has no wheezes. He has no rales.  Abdominal: Soft. Bowel sounds are normal. He exhibits no distension and no mass. There is no tenderness. Hernia confirmed negative in the right inguinal area.  Genitourinary: Rectum normal, prostate normal and penis normal. Rectal exam shows no external hemorrhoid, no internal hemorrhoid, no fissure, no mass, no tenderness, anal tone normal and guaiac negative stool. Prostate is not enlarged and not tender. Right testis shows no mass, no swelling and no tenderness. Left testis shows no swelling and no tenderness. Circumcised. No penile erythema or penile tenderness. No discharge found.  Musculoskeletal: Normal range of motion. He exhibits no edema or tenderness.  Lymphadenopathy:    He has no cervical adenopathy.       Right: No inguinal adenopathy present.       Left: No inguinal adenopathy present.  Neurological: He is alert and oriented to person, place, and time.  Skin: Skin is warm and dry. No rash noted. He is not diaphoretic. No erythema. No pallor.  Psychiatric: He has a normal mood and affect. His behavior is normal. Judgment and thought content normal.  Vitals reviewed.       Assessment & Plan:   Kenzie was seen today for abdominal pain and annual exam.  Diagnoses  and all orders for this visit:  Routine general medical examination at a health care facility- Exam completed, labs reviewed, he received a tetanus booster, he refused a flu vaccine, patient education material was given. -     Lipid panel; Future -     PSA; Future -     HIV antibody; Future  Need for hepatitis C screening test -     Hepatitis C antibody; Future  Periumbilical abdominal pain- His lab work is negative for any secondary causes.  There is no evidence of viral hepatitis.  Liver enzymes and pancreatic enzymes are normal.  I am concerned about mesenteric ischemia so I have ordered a CT angio. -     Amylase; Future -     Lipase; Future -     CBC with Differential/Platelet; Future -     Comprehensive metabolic panel; Future -     Urinalysis, Routine w reflex microscopic; Future -     Hepatitis A antibody, total; Future -     Hepatitis B core antibody, total; Future -     Hepatitis B surface antibody; Future -     CT ANGIO ABDOMEN W &/OR WO CONTRAST; Future  Tobacco abuse -he was asked to quit smoking.  Dizziness- He has chronic intermittent dizziness for several years.  His exam is unremarkable.  EKG is normal.  Lab work is negative for any secondary causes.  This is most likely related to his hearing loss. -     EKG 12-Lead  Chronic vascular disorder of intestine (HCC) -     CT ANGIO ABDOMEN W &/OR WO CONTRAST; Future  Need for Tdap vaccination -     Tdap vaccine greater than or equal to 7yo IM  Hyperlipidemia with target LDL less than 100- He has a mildly elevated ASCVD risk score and multiple dead family members.  I have asked him to start a statin for CV risk reduction. -     rosuvastatin (CRESTOR) 5 MG tablet; Take 1 tablet (5 mg total) by mouth daily.   I have discontinued Katharine Look. Wren "RAY"'s ibuprofen and pantoprazole. I am also having him start on rosuvastatin. We will stop administering sodium chloride.  Meds ordered this encounter  Medications  .  rosuvastatin (CRESTOR) 5 MG tablet    Sig: Take 1 tablet (5 mg total) by mouth daily.    Dispense:  90 tablet    Refill:  1     Follow-up: Return in about 4 weeks (around 07/09/2017).  [name], MD

## 2017-06-12 ENCOUNTER — Encounter: Payer: Self-pay | Admitting: Internal Medicine

## 2017-06-12 DIAGNOSIS — Z23 Encounter for immunization: Secondary | ICD-10-CM | POA: Insufficient documentation

## 2017-06-12 DIAGNOSIS — E785 Hyperlipidemia, unspecified: Secondary | ICD-10-CM | POA: Insufficient documentation

## 2017-06-12 DIAGNOSIS — K551 Chronic vascular disorders of intestine: Secondary | ICD-10-CM | POA: Insufficient documentation

## 2017-06-12 DIAGNOSIS — R42 Dizziness and giddiness: Secondary | ICD-10-CM | POA: Insufficient documentation

## 2017-06-12 LAB — HEPATITIS C ANTIBODY
HEP C AB: NONREACTIVE
SIGNAL TO CUT-OFF: 0.05 (ref <1.00)

## 2017-06-12 LAB — HEPATITIS A ANTIBODY, TOTAL: HEPATITIS A AB,TOTAL: NONREACTIVE

## 2017-06-12 LAB — HEPATITIS B CORE ANTIBODY, TOTAL: Hep B Core Total Ab: NONREACTIVE

## 2017-06-12 LAB — HIV ANTIBODY (ROUTINE TESTING W REFLEX): HIV: NONREACTIVE

## 2017-06-12 LAB — HEPATITIS B SURFACE ANTIBODY,QUALITATIVE: Hep B S Ab: NONREACTIVE

## 2017-06-12 MED ORDER — ROSUVASTATIN CALCIUM 5 MG PO TABS: 5.0000 mg | ORAL_TABLET | Freq: Every day | ORAL | 1 refills | Status: DC

## 2017-06-13 ENCOUNTER — Inpatient Hospital Stay: Admission: RE | Admit: 2017-06-13 | Payer: BLUE CROSS/BLUE SHIELD | Source: Ambulatory Visit

## 2017-06-13 ENCOUNTER — Ambulatory Visit (INDEPENDENT_AMBULATORY_CARE_PROVIDER_SITE_OTHER): Payer: BLUE CROSS/BLUE SHIELD

## 2017-06-13 DIAGNOSIS — Z299 Encounter for prophylactic measures, unspecified: Secondary | ICD-10-CM

## 2017-06-13 DIAGNOSIS — Z23 Encounter for immunization: Secondary | ICD-10-CM

## 2017-06-23 ENCOUNTER — Emergency Department (HOSPITAL_COMMUNITY)
Admission: EM | Admit: 2017-06-23 | Discharge: 2017-06-23 | Discharge status: Against Medical Advice | Payer: BLUE CROSS/BLUE SHIELD | Attending: Emergency Medicine | Admitting: Emergency Medicine

## 2017-06-23 ENCOUNTER — Other Ambulatory Visit: Payer: Self-pay

## 2017-06-23 ENCOUNTER — Encounter (HOSPITAL_COMMUNITY): Payer: Self-pay | Admitting: Emergency Medicine

## 2017-06-23 DIAGNOSIS — Z5321 Procedure and treatment not carried out due to patient leaving prior to being seen by health care provider: Secondary | ICD-10-CM | POA: Diagnosis not present

## 2017-06-23 DIAGNOSIS — R197 Diarrhea, unspecified: Secondary | ICD-10-CM | POA: Insufficient documentation

## 2017-06-23 NOTE — ED Triage Notes (Signed)
Patient c/o flu like symptoms. Per patient cough, fever, body aches, nausea, and diarrhea. Denies any vomiting. Per patient symptoms since Friday-coworkers with similar symptoms.

## 2017-06-23 NOTE — ED Notes (Signed)
Pt spoke with this RN in the waiting area and said he was leaving because he has waitied long enough. This RN apologized for the wait and pt walked out with his son.

## 2017-06-24 ENCOUNTER — Encounter: Payer: Self-pay | Admitting: Family

## 2017-06-24 ENCOUNTER — Ambulatory Visit: Payer: BLUE CROSS/BLUE SHIELD | Admitting: Family

## 2017-06-24 VITALS — BP 130/80 | HR 102 | Temp 100.0°F | Ht 74.0 in | Wt 166.0 lb

## 2017-06-24 DIAGNOSIS — J111 Influenza due to unidentified influenza virus with other respiratory manifestations: Secondary | ICD-10-CM

## 2017-06-24 MED ORDER — OSELTAMIVIR PHOSPHATE 75 MG PO CAPS: 75.0000 mg | ORAL_CAPSULE | Freq: Two times a day (BID) | ORAL | 0 refills | Status: DC

## 2017-06-24 MED ORDER — BENZONATATE 200 MG PO CAPS
200.0000 mg | ORAL_CAPSULE | Freq: Three times a day (TID) | ORAL | 0 refills | Status: DC | PRN
Start: 2017-06-24 — End: 2017-10-21

## 2017-06-24 NOTE — Progress Notes (Signed)
Kenneth Gilbert is a 52 y.o. male with the following history as recorded in EpicCare:  Patient Active Problem List   Diagnosis Date Noted  . Dizziness 06/12/2017  . Chronic vascular disorder of intestine ([place]) 06/12/2017  . Need for Tdap vaccination 06/12/2017  . Hyperlipidemia with target LDL less than 100 06/12/2017  . Need for hepatitis C screening test 06/11/2017  . Routine general medical examination at a health care facility 06/11/2017  . Periumbilical abdominal pain 06/11/2017  . Tobacco abuse 06/11/2017  . HEARING DEFICIT 07/12/2009  . GERD 07/12/2009    Current Outpatient Medications  Medication Sig Dispense Refill  . rosuvastatin (CRESTOR) 5 MG tablet Take 1 tablet (5 mg total) by mouth daily. 90 tablet 1  . benzonatate (TESSALON) 200 MG capsule Take 1 capsule (200 mg total) by mouth 3 (three) times daily as needed for cough. 20 capsule 0  . oseltamivir (TAMIFLU) 75 MG capsule Take 1 capsule (75 mg total) by mouth 2 (two) times daily. 10 capsule 0   No current facility-administered medications for this visit.     Allergies: Codeine  Past Medical History:  Diagnosis Date  . Arthritis   . ETOH abuse   . Hyperlipidemia     Past Surgical History:  Procedure Laterality Date  . arm surgery Right   . arm surgery Left   . INNER EAR SURGERY     as a child  . SHOULDER SURGERY Left     Family History  Problem Relation Age of Onset  . Cancer Father        unknown type  . Cirrhosis Sister   . Heart disease Brother     Social History   Tobacco Use  . Smoking status: Current Every Day Smoker    Packs/day: 1.50    Types: Cigarettes    Start date: 06/11/1978  . Smokeless tobacco: Never Used  Substance Use Topics  . Alcohol use: No    Frequency: Never    Comment: occasionally    Subjective:  Started with flu-like symptoms on Friday evening; complaining of chills/ body aches and congestion; notes that he is feeling light-headed; notes that son is sick with similar  symptoms; + chest feels tight but no shortness of breath; does have some diarreha/ notes that appetite is down as well; not on any OTC medications;   Objective:  Vitals:   06/24/17 0846  BP: 130/80  Pulse: (!) 102  Temp: 100 F (37.8 C)  TempSrc: Oral  SpO2: 99%  Weight: 166 lb (75.3 kg)  Height: 6\' 2"  (1.88 m)    General: Well developed, well nourished, in no acute distress; prefers to lie down during exam;  Skin : Warm and dry.  Head: Normocephalic and atraumatic  Eyes: Sclera and conjunctiva clear; pupils round and reactive to light; extraocular movements intact  Ears: External normal; canals clear; tympanic membranes normal  Oropharynx: Pink, supple. No suspicious lesions  Neck: Supple without thyromegaly, adenopathy  Lungs: Respirations unlabored; clear to auscultation bilaterally without wheeze, rales, rhonchi  CVS exam: normal rate and regular rhythm.  Neurologic: Alert and oriented; speech intact; face symmetrical; moves all extremities well; CNII-XII intact without focal deficit  Assessment:  1. Influenza     Plan:  Rx for Tamiflu 75 mg bid x 5 days; Rx for Tessalon Perles 200 mg tid prn cough; increase fluids, rest; work note for remainder of week; follow-up worse, no better.    No Follow-up on file.  No orders of the  defined types were placed in this encounter.   Requested Prescriptions   Signed Prescriptions Disp Refills  . oseltamivir (TAMIFLU) 75 MG capsule 10 capsule 0    Sig: Take 1 capsule (75 mg total) by mouth 2 (two) times daily.  . benzonatate (TESSALON) 200 MG capsule 20 capsule 0    Sig: Take 1 capsule (200 mg total) by mouth 3 (three) times daily as needed for cough.

## 2017-06-24 NOTE — Patient Instructions (Signed)

## 2017-06-25 ENCOUNTER — Inpatient Hospital Stay: Admission: RE | Admit: 2017-06-25 | Payer: BLUE CROSS/BLUE SHIELD | Source: Ambulatory Visit

## 2017-06-27 ENCOUNTER — Ambulatory Visit: Payer: Self-pay | Admitting: *Deleted

## 2017-06-27 NOTE — Telephone Encounter (Signed)
  Called in c/o feeling "sick on my stomach"  No vomiting.   Able to take fluids and eat ok.   Was diagnosed with the flu and is on Tamiflu.   Son also diagnosed with the flu that lives with him.   "I just feel so bad" I informed him it takes 7-10 days before he will start to feel better.   He is already on the recommended treatment.   I went over all the home care advice with him and suggested he pass it on to his son.   He said his son went on to work but feels so bad he has gone to the [facility] ED now.    I instructed him to call us back if he became short of breath, spiked a fever, started vomiting or having diarrhea where he could not keep anything down for a day or two.   He verbalized understanding of these instructions. Reason for Disposition . [1] Probable influenza (fever) with no complications AND [1] NOT HIGH RISK  Answer Assessment - Initial Assessment Questions 1. WORST SYMPTOM: "What is your worst symptom?" (e.g., cough, runny nose, muscle aches, headache, sore throat, fever)      Nauseas.   I just feel sick on my stomach.   I had diarrhea but it has cleared up. 2. ONSET: "When did your flu symptoms start?"      I saw the doctor Monday diagnosed with the flu.   My son also has the flu and is doing better but I'm not.   But now my son is at [facility] ED because he feels bad again. 3. COUGH: "How bad is the cough?"       I'm coughing.  They gave cough medicine.  I've been taking it.   I take the Tamiflu and the cough medicine at the same time. 4. RESPIRATORY DISTRESS: "Describe your breathing."      No 5. FEVER: "Do you have a fever?" If so, ask: "What is your temperature, how was it measured, and when did it start?"     I've not checked it.  I don't have a thermometer.  I don't feel like I have fever 6. EXPOSURE: "Were you exposed to someone with influenza?"       My son and I both are sick with the flu. 7. FLU VACCINE: "Did you get a flu shot this year?"     No.  I've never had one. 8.  HIGH RISK DISEASE: "Do you any chronic medical problems?" (e.g., heart or lung disease, asthma, weak immune system, or other HIGH RISK conditions)     No chronic problems.    9. PREGNANCY: "Is there any chance you are pregnant?" "When was your last menstrual period?"     N/A 10. OTHER SYMPTOMS: "Do you have any other symptoms?"  (e.g., runny nose, muscle aches, headache, sore throat)       I woke up with a sore throat and a headache.  I took some Tylenol 30 minutes ago.  Protocols used: INFLUENZA - SEASONAL-A-AH

## 2017-07-04 ENCOUNTER — Ambulatory Visit (INDEPENDENT_AMBULATORY_CARE_PROVIDER_SITE_OTHER)
Admission: RE | Admit: 2017-07-04 | Discharge: 2017-07-04 | Disposition: A | Discharge status: Home / Self Care | Payer: BLUE CROSS/BLUE SHIELD | Source: Ambulatory Visit | Attending: Internal Medicine | Admitting: Internal Medicine

## 2017-07-04 DIAGNOSIS — K551 Chronic vascular disorders of intestine: Secondary | ICD-10-CM | POA: Diagnosis not present

## 2017-07-04 DIAGNOSIS — R1033 Periumbilical pain: Secondary | ICD-10-CM

## 2017-07-04 MED ORDER — IOPAMIDOL (ISOVUE-370) INJECTION 76%
100.0000 mL | Freq: Once | INTRAVENOUS | Status: AC | PRN
  Administered 2017-07-04: 100 mL via INTRAVENOUS

## 2017-07-05 ENCOUNTER — Telehealth: Payer: Self-pay | Admitting: Internal Medicine

## 2017-07-05 ENCOUNTER — Encounter: Payer: Self-pay | Admitting: Internal Medicine

## 2017-07-05 ENCOUNTER — Other Ambulatory Visit: Payer: Self-pay | Admitting: Internal Medicine

## 2017-07-05 DIAGNOSIS — K551 Chronic vascular disorders of intestine: Secondary | ICD-10-CM

## 2017-07-05 NOTE — Telephone Encounter (Signed)
Copied from [place] [phone]. Topic: Quick Communication - See Telephone Encounter >> Jul 05, 2017  2:12 PM Bea Graff, NT wrote: CRM for notification. See Telephone encounter for: Pt would like a call to go over his results from testing he had completed yesterday.  07/05/17.

## 2017-07-05 NOTE — Progress Notes (Signed)
Please let him know that his CT scan showed a few blocked arteries in his abdomen as well as a small aneurysm. I have sent a referral to vascular surgery to see if this is causing some of his abdominal symptoms.

## 2017-07-08 NOTE — Progress Notes (Signed)
LVM for pt to call back as soon as possible.   

## 2017-07-19 ENCOUNTER — Encounter: Payer: BLUE CROSS/BLUE SHIELD | Admitting: Vascular Surgery

## 2017-07-25 ENCOUNTER — Ambulatory Visit (INDEPENDENT_AMBULATORY_CARE_PROVIDER_SITE_OTHER): Payer: BLUE CROSS/BLUE SHIELD

## 2017-07-25 DIAGNOSIS — Z23 Encounter for immunization: Secondary | ICD-10-CM | POA: Diagnosis not present

## 2017-07-25 DIAGNOSIS — Z299 Encounter for prophylactic measures, unspecified: Secondary | ICD-10-CM

## 2017-07-25 NOTE — Progress Notes (Signed)
Requested by:  Janith Lima, MD [address], [place] [address]  Reason for consultation: aortic atherosclerosis    History of Present Illness   Kenneth Gilbert is a 52 y.o. (Dec 06, 1965) male who presents with cc: chronic abdominal pain.  Patient was seen in ED recently with abdominal pain.  ED MD felt patient's sx might be consistent with mesenteric ischemia so CTA abd/pelvis was ordered.  2/3  Mesenteric arteries appeared widely patent.  Small abdominal ectasia was found and extensive aortic atherosclerosis.  Patient notes weight loss, abdominal pain and foot fear.  He gets a vague epigastric pain that is moderate at worst.  He feels eating worsens his sx.  He denies any diarrhea or hematochezia.  He reported has had colonoscopy and EGD completed recently.   In regards to atherosclerosis, patient risk factors include: family history, HLD and active smoking.  He denies rest pain but does endorse claudication.  He denies ever having any embolic sx.  Past Medical History:  Diagnosis Date  . Arthritis   . ETOH abuse   . Hyperlipidemia     Past Surgical History:  Procedure Laterality Date  . arm surgery Right   . arm surgery Left   . INNER EAR SURGERY     as a child  . SHOULDER SURGERY Left      Social History   Socioeconomic History  . Marital status: Divorced    Spouse name: Not on file  . Number of children: Not on file  . Years of education: Not on file  . Highest education level: Not on file  Social Needs  . Financial resource strain: Not on file  . Food insecurity - worry: Not on file  . Food insecurity - inability: Not on file  . Transportation needs - medical: Not on file  . Transportation needs - non-medical: Not on file  Occupational History  . Not on file  Tobacco Use  . Smoking status: Current Every Day Smoker    Packs/day: 1.50    Types: Cigarettes    Start date: 06/11/1978  . Smokeless tobacco: Never Used  Substance and  Sexual Activity  . Alcohol use: No    Frequency: Never    Comment: occasionally  . Drug use: Yes    Types: Marijuana    Comment: used yesterday 05/29/17  . Sexual activity: Yes    Partners: Female  Other Topics Concern  . Not on file  Social History Narrative  . Not on file    Family History  Problem Relation Age of Onset  . Cancer Father        unknown type  . Cirrhosis Sister   . Heart disease Brother     Current Outpatient Medications  Medication Sig Dispense Refill  . benzonatate (TESSALON) 200 MG capsule Take 1 capsule (200 mg total) by mouth 3 (three) times daily as needed for cough. 20 capsule 0  . oseltamivir (TAMIFLU) 75 MG capsule Take 1 capsule (75 mg total) by mouth 2 (two) times daily. 10 capsule 0  . rosuvastatin (CRESTOR) 5 MG tablet Take 1 tablet (5 mg total) by mouth daily. 90 tablet 1   No current facility-administered medications for this visit.     Allergies  Allergen Reactions  . Codeine     REACTION: Dizzy    REVIEW OF SYSTEMS (negative unless checked):   Cardiac:  []  Chest pain or chest pressure? []  Shortness of breath upon activity? []  Shortness  of breath when lying flat? []  Irregular heart rhythm?  Vascular:  [x]  Pain in calf, thigh, or hip brought on by walking? []  Pain in feet at night that wakes you up from your sleep? []  Blood clot in your veins? []  Leg swelling?  Pulmonary:  []  Oxygen at home? []  Productive cough? []  Wheezing?  Neurologic:  []  Sudden weakness in arms or legs? []  Sudden numbness in arms or legs? []  Sudden onset of difficult speaking or slurred speech? []  Temporary loss of vision in one eye? [x]  Problems with dizziness?  Gastrointestinal:  []  Blood in stool? []  Vomited blood?  Genitourinary:  []  Burning when urinating? []  Blood in urine?  Psychiatric:  []  Major depression  Hematologic:  []  Bleeding problems? []  Problems with blood clotting?  Dermatologic:  []  Rashes or  ulcers?  Constitutional:  []  Fever or chills?  Ear/Nose/Throat:  []  Change in hearing? []  Nose bleeds? []  Sore throat?  Musculoskeletal:  []  Back pain? []  Joint pain? []  Muscle pain?   For VQI Use Only   PRE-ADM LIVING Home  AMB STATUS Ambulatory  CAD Sx None  PRIOR CHF None  STRESS TEST No    Physical Examination     Vitals:   07/26/17 0826  BP: 107/66  Pulse: 77  Resp: 18  Temp: 98.5 F (36.9 C)  TempSrc: Oral  SpO2: 99%  Weight: 166 lb (75.3 kg)  Height: 6\' 2"  (1.88 m)   Body mass index is 21.31 kg/m.  General Alert, O x 3, WD, NAD, odor of cigarettes  Head [place]rens/AT,    Ear/Nose/ Throat Hearing grossly intact, nares without erythema or drainage, oropharynx without Erythema or Exudate, Mallampati score: 3,   Eyes PERRLA, EOMI,    Neck Supple, mid-line trachea,    Pulmonary Sym exp, good B air movt, CTA B  Cardiac RRR, Nl S1, S2, no Murmurs, No rubs, No S3,S4  Vascular Vessel Right Left  Radial Palpable Palpable  Brachial Palpable Palpable  Carotid Palpable, No Bruit Palpable, No Bruit  Aorta Not palpable N/A  Femoral Palpable Palpable  Popliteal Not palpable Not palpable  PT Not palpable Faintly palpable  DP Faintly palpable Faintly palpable    Gastro- intestinal soft, non-distended, non-tender to palpation, No guarding or rebound, no HSM, no masses, no CVAT B, No palpable prominent aortic pulse,    Musculo- skeletal M/S 5/5 throughout  , Extremities without ischemic changes  , No edema present, No visible varicosities , No Lipodermatosclerosis present  Neurologic Cranial nerves 2-12 intact , Pain and light touch intact in extremities , Motor exam as listed above  Psychiatric Judgement intact, Mood & affect appropriate for pt's clinical situation  Dermatologic See M/S exam for extremity exam, No rashes otherwise noted  Lymphatic  Palpable lymph nodes: None    Radiology     CTA Abd/pelvis (07/04/17): VASCULAR  1. 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 The superior and inferior mesenteric arteries remain widely patent. Findings are not suggestive of chronic mesenteric ischemia. 2. Ectasia of the infrarenal abdominal aorta measuring up to 2.8 cm. Ectatic abdominal aorta at risk for aneurysm development. Recommend followup by ultrasound in 5 years.  3.  Aortic Atherosclerosis (ICD10-170.0). 4. Mild stenosis of the proximal left common iliac artery.  NON-VASCULAR  1. No acute abnormality within the abdomen or pelvis. 2. Bilateral L5-S1 facet arthropathy.   Based on my review of this patient's CTA, he has an aortic ectasia with extensive calcific atherosclerosis  with rim of mural thrombus.  Perfusion throughout aortoiliac segments appears intact.   Outside Studies/Documentation   4 pages of outside documents were reviewed including: ED records.   Medical Decision Making   Kenneth Gilbert is a 52 y.o. 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  The patient has 2 out 3 mesenteric arteries widely patent and the celiac stenosis is not critical in degree.  I suggested he follow up with his GI to complete his work-up.  Based on the patient's vascular studies and examination, I have offered the patient: ABI and AAA duplex one year.  In regards to his leg cramping, he might have some degree of PAD given the aortoiliac findings but I can still palpate pulses in the feet, so he might have mild to moderate disease at the worst.  In patients such as this, maximizing medical mgmt: already on Crestor, starting baby aspirin today, and smoking cessation is recommended.  We had a >15 minute discussion in regards to smoking cessation.    I offered him nicotine patches and gum which he has had adverse reactions to in the past.  I offered him Chantix but he  doesn't think he can offer the medication.  I also offered him counseling, but he doesn't think get can get to the meetings.  Patient is going to see what he can do on his own for now.  I discussed in depth with the patient the nature of atherosclerosis, and emphasized the importance of maximal medical management including strict control of blood pressure, blood glucose, and lipid levels, obtaining regular exercise, antiplatelet agents, and cessation of smoking.   The patient is currently on a statin: Crestor.  The patient is currently not on an anti-platelet. Patient will be started on ASA 81 mg PO daily  The patient is aware that without maximal medical management the underlying atherosclerotic disease process will progress, limiting the benefit of any interventions.  Thank you for allowing [name] to participate in this patient's care.   Adele Barthel, MD, FACS [facility] Office: [phone] Pager: [phone]  07/25/2017, 11:58 AM

## 2017-07-26 ENCOUNTER — Encounter: Payer: Self-pay | Admitting: Vascular Surgery

## 2017-07-26 ENCOUNTER — Other Ambulatory Visit: Payer: Self-pay

## 2017-07-26 ENCOUNTER — Ambulatory Visit (INDEPENDENT_AMBULATORY_CARE_PROVIDER_SITE_OTHER): Payer: BLUE CROSS/BLUE SHIELD | Admitting: Vascular Surgery

## 2017-07-26 DIAGNOSIS — I77811 Abdominal aortic ectasia: Secondary | ICD-10-CM | POA: Insufficient documentation

## 2017-07-26 DIAGNOSIS — I700 Atherosclerosis of aorta: Secondary | ICD-10-CM | POA: Insufficient documentation

## 2017-09-26 ENCOUNTER — Ambulatory Visit (INDEPENDENT_AMBULATORY_CARE_PROVIDER_SITE_OTHER): Payer: BLUE CROSS/BLUE SHIELD | Admitting: *Deleted

## 2017-09-26 DIAGNOSIS — Z23 Encounter for immunization: Secondary | ICD-10-CM | POA: Diagnosis not present

## 2017-10-15 ENCOUNTER — Ambulatory Visit: Payer: Self-pay

## 2017-10-15 NOTE — Telephone Encounter (Signed)
Patient called in with c/o "headache." He says "the headache started on Saturday, but it is easing now. I've been dizzy for a few months, but Saturday I started sweating with the dizziness and got the headache. This has never happened before. The pain level is 4, but it's easing now." I asked about other symptoms, he denies. According to protocol, see PCP within 24 hours, no availability with PCP, appointment scheduled for tomorrow at 0800 with Jodi Mourning, NP, care advise given, patient verbalized understanding.   Reason for Disposition . [1] MODERATE headache (e.g., interferes with normal activities) AND [2] present > 24 hours AND [3] unexplained  (Exceptions: analgesics not tried, typical migraine, or headache part of viral illness)  Answer Assessment - Initial Assessment Questions 1. LOCATION: "Where does it hurt?"      Around towards temples 2. ONSET: "When did the headache start?" (Minutes, hours or days)      Saturday 3. PATTERN: "Does the pain come and go, or has it been constant since it started?"     Constant since Saturday, starting to ease now 4. SEVERITY: "How bad is the pain?" and "What does it keep you from doing?"  (e.g., Scale 1-10; mild, moderate, or severe)   - MILD (1-3): doesn't interfere with normal activities    - MODERATE (4-7): interferes with normal activities or awakens from sleep    - SEVERE (8-10): excruciating pain, unable to do any normal activities        4 5. RECURRENT SYMPTOM: "Have you ever had headaches before?" If so, ask: "When was the last time?" and "What happened that time?"      Not like this 6. CAUSE: "What do you think is causing the headache?"     I don't know 7. MIGRAINE: "Have you been diagnosed with migraine headaches?" If so, ask: "Is this headache similar?"      No 8. HEAD INJURY: "Has there been any recent injury to the head?"      No 9. OTHER SYMPTOMS: "Do you have any other symptoms?" (fever, stiff neck, eye pain, sore throat, cold  symptoms)     Dizziness, sweating 10. PREGNANCY: "Is there any chance you are pregnant?" "When was your last menstrual period?"     N/A  Protocols used: HEADACHE-A-AH

## 2017-10-16 ENCOUNTER — Encounter: Payer: Self-pay | Admitting: Family

## 2017-10-16 ENCOUNTER — Ambulatory Visit: Payer: BLUE CROSS/BLUE SHIELD | Admitting: Family

## 2017-10-16 VITALS — BP 112/76 | HR 74 | Temp 98.2°F | Ht 74.0 in | Wt 172.1 lb

## 2017-10-16 DIAGNOSIS — R29818 Other symptoms and signs involving the nervous system: Secondary | ICD-10-CM

## 2017-10-16 DIAGNOSIS — R42 Dizziness and giddiness: Secondary | ICD-10-CM | POA: Diagnosis not present

## 2017-10-16 NOTE — Progress Notes (Signed)
Kenneth Gilbert is a 52 y.o. male with the following history as recorded in EpicCare:  Patient Active Problem List   Diagnosis Date Noted  . Aortic atherosclerosis ([place]) 07/26/2017  . Aortic ectasia, abdominal ([place]) 07/26/2017  . Dizziness 06/12/2017  . Chronic vascular disorder of intestine ([place]) 06/12/2017  . Need for Tdap vaccination 06/12/2017  . Hyperlipidemia with target LDL less than 100 06/12/2017  . Need for hepatitis C screening test 06/11/2017  . Routine general medical examination at a health care facility 06/11/2017  . Periumbilical abdominal pain 06/11/2017  . Tobacco abuse 06/11/2017  . HEARING DEFICIT 07/12/2009  . GERD 07/12/2009    Current Outpatient Medications  Medication Sig Dispense Refill  . rosuvastatin (CRESTOR) 5 MG tablet Take 1 tablet (5 mg total) by mouth daily. 90 tablet 1  . benzonatate (TESSALON) 200 MG capsule Take 1 capsule (200 mg total) by mouth 3 (three) times daily as needed for cough. (Patient not taking: Reported on 07/26/2017) 20 capsule 0  . oseltamivir (TAMIFLU) 75 MG capsule Take 1 capsule (75 mg total) by mouth 2 (two) times daily. (Patient not taking: Reported on 07/26/2017) 10 capsule 0   No current facility-administered medications for this visit.     Allergies: Codeine  Past Medical History:  Diagnosis Date  . Arthritis   . ETOH abuse   . Hyperlipidemia     Past Surgical History:  Procedure Laterality Date  . arm surgery Right   . arm surgery Left   . INNER EAR SURGERY     as a child  . SHOULDER SURGERY Left     Family History  Problem Relation Age of Onset  . Cancer Father        unknown type  . Cirrhosis Sister   . Heart disease Brother     Social History   Tobacco Use  . Smoking status: Current Every Day Smoker    Packs/day: 1.50    Types: Cigarettes    Start date: 06/11/1978  . Smokeless tobacco: Never Used  Substance Use Topics  . Alcohol use: No    Frequency: Never    Comment: occasionally    Subjective:   Patient presents with chronic dizziness x "years." Did speak to his PCP about these symptoms at his CPE and labs/ EKG were normal; thought to be related to hearing loss; patient notes that symptoms seem to be worsening recently however; Denies any chest pain, shortness of breath, blurred vision; occasional headache; denies any palpitations; history of ear surgery as a child; describes sensation as "feeling off balance"; denies any nausea or vomiting/ no episodes of falls;     Objective:  Vitals:   10/16/17 0808  BP: 112/76  Pulse: 74  Temp: 98.2 F (36.8 C)  TempSrc: Oral  SpO2: 96%  Weight: 172 lb 1.3 oz (78.1 kg)  Height: 6\' 2"  (1.88 m)    General: Well developed, well nourished, in no acute distress  Skin : Warm and dry.  Head: Normocephalic and atraumatic  Eyes: Sclera and conjunctiva clear; pupils round and reactive to light; extraocular movements intact  Ears: External normal; bilateral ear wax impactions; Oropharynx: Pink, supple. No suspicious lesions  Neck: Supple without thyromegaly, adenopathy  Lungs: Respirations unlabored; clear to auscultation bilaterally without wheeze, rales, rhonchi  CVS exam: normal rate and regular rhythm.  Neurologic: Alert and oriented; speech intact; face symmetrical; moves all extremities well; CNII-XII intact without focal deficit  Assessment:  1. Dizziness   2. Other symptoms and  signs involving the nervous system     Plan:  Chronic symptoms and worsening recently; Reviewed labs and EKG done in January 2019; update head CT and bilateral carotid dopplers; encouraged to quit smoking; he will use Debrox for 3-4 days and return for ear lavage early next week; follow-up to be determined- may need to refer to ENT for evaluation/ vestibular therapy;  Return for Monday for ear lavage.  Orders Placed This Encounter  Procedures  . CT Head Wo Contrast    Standing Status:   Future    Standing Expiration Date:   01/17/2019    Scheduling Instructions:      Please try to schedule on Monday, June 10; please schedule with cartoid doppler testing    Order Specific Question:   Preferred imaging location?    Answer:   [facility]    Order Specific Question:   Radiology Contrast Protocol - do NOT remove file path    Answer:   [email or web address]  . US Carotid Duplex Bilateral    Standing Status:   Future    Standing Expiration Date:   12/17/2018    Scheduling Instructions:     Please schedule along with head CT-would like on June 10    Order Specific Question:   Reason for exam:    Answer:   dizziness    Order Specific Question:   Preferred imaging location?    Answer:   [facility]    Requested Prescriptions    No prescriptions requested or ordered in this encounter

## 2017-10-16 NOTE — Patient Instructions (Signed)
Please OTC Debrox and use in your ears for 3-4 days and then return for ear lavage on Monday;

## 2017-10-21 ENCOUNTER — Encounter: Payer: Self-pay | Admitting: Family

## 2017-10-21 ENCOUNTER — Other Ambulatory Visit: Payer: Self-pay | Admitting: Family

## 2017-10-21 ENCOUNTER — Telehealth: Payer: Self-pay

## 2017-10-21 ENCOUNTER — Ambulatory Visit: Payer: BLUE CROSS/BLUE SHIELD | Admitting: Family

## 2017-10-21 VITALS — BP 110/70 | HR 75 | Temp 98.6°F | Ht 74.0 in | Wt 172.0 lb

## 2017-10-21 DIAGNOSIS — R42 Dizziness and giddiness: Secondary | ICD-10-CM | POA: Diagnosis not present

## 2017-10-21 DIAGNOSIS — H6121 Impacted cerumen, right ear: Secondary | ICD-10-CM | POA: Diagnosis not present

## 2017-10-21 NOTE — Progress Notes (Signed)
  Kenneth Gilbert is a 52 y.o. male with the following history as recorded in [employer]:  Patient Active Problem List   Diagnosis Date Noted  . Aortic atherosclerosis ([place]) 07/26/2017  . Aortic ectasia, abdominal ([place]) 07/26/2017  . Dizziness 06/12/2017  . Chronic vascular disorder of intestine ([place]) 06/12/2017  . Need for Tdap vaccination 06/12/2017  . Hyperlipidemia with target LDL less than 100 06/12/2017  . Need for hepatitis C screening test 06/11/2017  . Routine general medical examination at a health care facility 06/11/2017  . Periumbilical abdominal pain 06/11/2017  . Tobacco abuse 06/11/2017  . HEARING DEFICIT 07/12/2009  . GERD 07/12/2009    Current Outpatient Medications  Medication Sig Dispense Refill  . rosuvastatin (CRESTOR) 5 MG tablet Take 1 tablet (5 mg total) by mouth daily. 90 tablet 1   No current facility-administered medications for this visit.     Allergies: Codeine  Past Medical History:  Diagnosis Date  . Arthritis   . ETOH abuse   . Hyperlipidemia     Past Surgical History:  Procedure Laterality Date  . arm surgery Right   . arm surgery Left   . INNER EAR SURGERY     as a child  . SHOULDER SURGERY Left     Family History  Problem Relation Age of Onset  . Cancer Father        unknown type  . Cirrhosis Sister   . Heart disease Brother     Social History   Tobacco Use  . Smoking status: Current Every Day Smoker    Packs/day: 1.50    Types: Cigarettes    Start date: 06/11/1978  . Smokeless tobacco: Never Used  Substance Use Topics  . Alcohol use: No    Frequency: Never    Comment: occasionally    Subjective:  Patient was seen last week with complaints of chronic dizziness; ear lavage was recommended and he returns to complete that today; was supposed to have CT/ bilateral doppler but has not heard about those tests yet;   Objective:  Vitals:   10/21/17 0821  BP: 110/70  Pulse: 75  Temp: 98.6 F (37 C)  TempSrc: Oral  SpO2: 97%   Weight: 172 lb (78 kg)  Height: 6\' 2"  (1.88 m)    General: Well developed, well nourished, in no acute distress  Skin : Warm and dry.  Head: Normocephalic and atraumatic  Eyes: Sclera and conjunctiva clear; pupils round and reactive to light; extraocular movements intact  Ears: External normal; canals clear; after lavage, tympanic membranes normal  Oropharynx: Pink, supple. No suspicious lesions  Neck: Supple without thyromegaly, adenopathy  Neurologic: Alert and oriented; speech intact; face symmetrical; moves all extremities well; CNII-XII intact without focal deficit   Assessment:  1. Impacted cerumen of right ear     Plan:  Ear lavage completed with no difficulty; left ear had responded to [place] so no treatment needed; Will check on status of head CT and carotid dopplers; follow-up to be determined.   No follow-ups on file.  No orders of the defined types were placed in this encounter.   Requested Prescriptions    No prescriptions requested or ordered in this encounter

## 2017-10-21 NOTE — Telephone Encounter (Signed)
Patient still hadn't heard anything back regarding his CT referral. I double checked his order but it states incomplete and I didn't see where it had been completed by Cecille Rubin or [place]. Is there anything you need me to assist with in getting him called?

## 2017-10-22 ENCOUNTER — Encounter (HOSPITAL_COMMUNITY): Payer: Self-pay | Admitting: Emergency Medicine

## 2017-10-22 ENCOUNTER — Emergency Department (HOSPITAL_COMMUNITY)
Admission: EM | Admit: 2017-10-22 | Discharge: 2017-10-22 | Disposition: A | Discharge status: Home / Self Care | Payer: BLUE CROSS/BLUE SHIELD | Attending: Emergency Medicine | Admitting: Emergency Medicine

## 2017-10-22 ENCOUNTER — Emergency Department (HOSPITAL_COMMUNITY): Payer: BLUE CROSS/BLUE SHIELD

## 2017-10-22 ENCOUNTER — Other Ambulatory Visit: Payer: Self-pay

## 2017-10-22 DIAGNOSIS — R42 Dizziness and giddiness: Secondary | ICD-10-CM | POA: Insufficient documentation

## 2017-10-22 DIAGNOSIS — Z79899 Other long term (current) drug therapy: Secondary | ICD-10-CM | POA: Diagnosis not present

## 2017-10-22 DIAGNOSIS — R51 Headache: Secondary | ICD-10-CM | POA: Diagnosis not present

## 2017-10-22 DIAGNOSIS — F17210 Nicotine dependence, cigarettes, uncomplicated: Secondary | ICD-10-CM | POA: Diagnosis not present

## 2017-10-22 LAB — BASIC METABOLIC PANEL
Anion gap: 8 (ref 5–15)
BUN: 8 mg/dL (ref 6–20)
CO2: 24 mmol/L (ref 22–32)
Calcium: 9.2 mg/dL (ref 8.9–10.3)
Chloride: 108 mmol/L (ref 101–111)
Creatinine, Ser: 0.71 mg/dL (ref 0.61–1.24)
GFR calc Af Amer: >60 mL/min (ref >60)
GLUCOSE: 100 mg/dL — AB (ref 65–99)
POTASSIUM: 4.1 mmol/L (ref 3.5–5.1)
Sodium: 140 mmol/L (ref 135–145)

## 2017-10-22 LAB — URINALYSIS, ROUTINE W REFLEX MICROSCOPIC
BILIRUBIN URINE: NEGATIVE
GLUCOSE, UA: NEGATIVE mg/dL
HGB URINE DIPSTICK: NEGATIVE
KETONES UR: NEGATIVE mg/dL
Leukocytes, UA: NEGATIVE
NITRITE: NEGATIVE
PH: 7 (ref 5.0–8.0)
Protein, ur: NEGATIVE mg/dL
Specific Gravity, Urine: 1.012 (ref 1.005–1.030)

## 2017-10-22 LAB — CBC
HEMATOCRIT: 45.3 % (ref 39.0–52.0)
Hemoglobin: 15.5 g/dL (ref 13.0–17.0)
MCH: 31.8 pg (ref 26.0–34.0)
MCHC: 34.2 g/dL (ref 30.0–36.0)
MCV: 93 fL (ref 78.0–100.0)
Platelets: 148 10*3/uL — ABNORMAL LOW (ref 150–400)
RBC: 4.87 MIL/uL (ref 4.22–5.81)
RDW: 12.2 % (ref 11.5–15.5)
WBC: 5.7 10*3/uL (ref 4.0–10.5)

## 2017-10-22 MED ORDER — FLUTICASONE PROPIONATE 50 MCG/ACT NA SUSP: 2.0000 | Freq: Every day | NASAL | 0 refills | Status: DC

## 2017-10-22 NOTE — ED Notes (Signed)
Pt to CT at this time via wheelchair

## 2017-10-22 NOTE — ED Provider Notes (Signed)
[facility] EMERGENCY DEPARTMENT Provider Note   CSN: [id number] Arrival date & time: 10/22/17  1009     History   Chief Complaint Chief Complaint  Patient presents with  . Dizziness  . Nausea    HPI Kenneth Gilbert is a 52 y.o. male.  HPI   He complains of intermittent dizziness present for 2 years, which comes and goes at various times.  There are no specific provocations.  Yesterday he saw his oncologist, who cleaned his ears because he had wax in them and thought that that might improve the dizziness.  He has not noticed any improvement since then.  He has chronic hearing deficit and wears an amplifier in his left ear, with hearing loss, since he was a child.  He denies headache, chest pain, cough, shortness of breath, focal weakness or paresthesia.  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  There are no other known modifying factors.  Past Medical History:  Diagnosis Date  . Arthritis   . ETOH abuse   . Hyperlipidemia     Patient Active Problem List   Diagnosis Date Noted  . Aortic atherosclerosis ([place]) 07/26/2017  . Aortic ectasia, abdominal ([place]) 07/26/2017  . Dizziness 06/12/2017  . Chronic vascular disorder of intestine ([place]) 06/12/2017  . Need for Tdap vaccination 06/12/2017  . Hyperlipidemia with target LDL less than 100 06/12/2017  . Need for hepatitis C screening test 06/11/2017  . Routine general medical examination at a health care facility 06/11/2017  . Periumbilical abdominal pain 06/11/2017  . Tobacco abuse 06/11/2017  . HEARING DEFICIT 07/12/2009  . GERD 07/12/2009    Past Surgical History:  Procedure Laterality Date  . arm surgery Right   . arm surgery Left   . INNER EAR SURGERY     as a child  . SHOULDER SURGERY Left         Home Medications    Prior to Admission medications   Medication Sig Start Date End Date Taking? Authorizing Provider  fluticasone (FLONASE) 50 MCG/ACT nasal spray Place 2 sprays into  both nostrils daily. 10/22/17   Daleen Bo, MD  rosuvastatin (CRESTOR) 5 MG tablet Take 1 tablet (5 mg total) by mouth daily. 06/12/17   Janith Lima, MD    Family History Family History  Problem Relation Age of Onset  . Cancer Father        unknown type  . Cirrhosis Sister   . Heart disease Brother     Social History Social History   Tobacco Use  . Smoking status: Current Every Day Smoker    Packs/day: 1.50    Types: Cigarettes    Start date: 06/11/1978  . Smokeless tobacco: Never Used  Substance Use Topics  . Alcohol use: No    Frequency: Never    Comment: occasionally  . Drug use: Yes    Types: Marijuana    Comment: used yesterday 05/29/17     Allergies   Codeine   Review of Systems Review of Systems  All other systems reviewed and are negative.    Physical Exam Updated Vital Signs BP 132/80 (BP Location: Right Arm)   Pulse 70   Temp 98.2 F (36.8 C) (Oral)   Resp 20   Ht 6\' 2"  (1.88 m)   Wt 80.3 kg (177 lb)   SpO2 98%   BMI 22.73 kg/m   Physical Exam  Constitutional: He is oriented to person, place, and time. He appears well-developed and  well-nourished. No distress.  HENT:  Head: Normocephalic and atraumatic.  Right Ear: External ear normal.  Left Ear: External ear normal.  Eyes: Pupils are equal, round, and reactive to light. Conjunctivae and EOM are normal.  Neck: Normal range of motion and phonation normal. Neck supple.  Cardiovascular: Normal rate, regular rhythm and normal heart sounds.  Pulmonary/Chest: Effort normal and breath sounds normal. He exhibits no bony tenderness.  Abdominal: Soft. There is no tenderness.  Musculoskeletal: Normal range of motion. He exhibits no deformity.  Neurological: He is alert and oriented to person, place, and time. No cranial nerve deficit or sensory deficit. He exhibits normal muscle tone. Coordination normal.  No dysarthria, or aphasia.  No nystagmus.  He walks with a normal gait.  Skin: Skin is  warm, dry and intact.  Psychiatric: He has a normal mood and affect. His behavior is normal. Judgment and thought content normal.  Nursing note and vitals reviewed.    ED Treatments / Results  Labs (all labs ordered are listed, but only abnormal results are displayed) Labs Reviewed  BASIC METABOLIC PANEL - Abnormal; Notable for the following components:      Result Value   Glucose, Bld 100 (*)    All other components within normal limits  CBC - Abnormal; Notable for the following components:   Platelets 148 (*)    All other components within normal limits  URINALYSIS, ROUTINE W REFLEX MICROSCOPIC    EKG None  Radiology Ct Head Wo Contrast  Result Date: 10/22/2017 CLINICAL DATA:  Ataxia, suspected stroke, dizziness, nausea, near syncopal feeling, headache EXAM: CT HEAD WITHOUT CONTRAST TECHNIQUE: Contiguous axial images were obtained from the base of the skull through the vertex without intravenous contrast. Sagittal and coronal MPR images reconstructed from axial data set. COMPARISON:  None FINDINGS: Brain: Normal ventricular morphology. No midline shift or mass effect. Normal appearance of brain parenchyma. No intracranial hemorrhage, mass lesion, evidence of acute infarction, or extra-axial fluid collection. Vascular: No hyperdense vessels. Minimal atherosclerotic calcification within the internal carotid arteries bilaterally at skull base Skull: Intact Sinuses/Orbits: Visualized paranasal sinuses clear. LEFT mastoid air cells clear. Opacified RIGHT mastoid air cells. Other: N/A IMPRESSION: No acute intracranial abnormalities. RIGHT mastoid effusion. Electronically Signed   By: Lavonia Dana M.D.   On: 10/22/2017 18:42    Procedures Procedures (including critical care time)  Medications Ordered in ED Medications - No data to display   Initial Impression / Assessment and Plan / ED Course  I have reviewed the triage vital signs and the nursing notes.  Pertinent labs & imaging  results that were available during my care of the patient were reviewed by me and considered in my medical decision making (see chart for details).      Patient Vitals for the past 24 hrs:  BP Temp Temp src Pulse Resp SpO2  10/22/17 1840 132/80 - - 70 - 98 %  10/22/17 1445 113/69 98.2 F (36.8 C) Oral 69 20 99 %  10/22/17 1234 114/71 - - 70 18 100 %    At discharge- reevaluation with update and discussion. After initial assessment and treatment, an updated evaluation reveals he remains comfortable, and cooperative.  Findings discussed and questions answered. Daleen Bo   Medical Decision Making: Nonspecific ongoing intermittent dizziness with findings for right mastoid effusion likely contributing to dizzy spells.  Suspect allergy mediated.  Doubt bacterial infection or metabolic instability.  Doubt CVA, or TIA.  CRITICAL CARE-no Performed by: Daleen Bo   Nursing  Notes Reviewed/ Care Coordinated Applicable Imaging Reviewed Interpretation of Laboratory Data incorporated into ED treatment  The patient appears reasonably screened and/or stabilized for discharge and I doubt any other medical condition or other [facility] requiring further screening, evaluation, or treatment in the ED at this time prior to discharge.  Plan: Home Medications-continue usual medicines and use Flonase as prescribed; Home Treatments-rest, fluids; return here if the recommended treatment, does not improve the symptoms; Recommended follow up-PCP follow-up 1 to 2 weeks and as needed.     Final Clinical Impressions(s) / ED Diagnoses   Final diagnoses:  Dizziness    ED Discharge Orders        Ordered    fluticasone (FLONASE) 50 MCG/ACT nasal spray  Daily     10/22/17 1933       Daleen Bo, MD 10/23/17 1051

## 2017-10-22 NOTE — ED Notes (Signed)
Pt refused EKG.

## 2017-10-22 NOTE — ED Notes (Signed)
Pt ambulatory to bathroom to obtain urine specimen.  Pt with steady gait. No problems noted

## 2017-10-22 NOTE — ED Triage Notes (Signed)
Pt. Stated, Donnald Garre been dizzy and some nausea, and I just need to get my head checked out and I feel like Im going to pass out.

## 2017-10-22 NOTE — ED Notes (Signed)
Patient verbalizes understanding of discharge instructions. Opportunity for questioning and answers were provided. Armband removed by staff, pt discharged from ED. E signature not available.  

## 2017-10-22 NOTE — ED Notes (Signed)
Pt waiting for CT and made aware of same.

## 2017-10-22 NOTE — Discharge Instructions (Addendum)
Your dizziness is likely related to fluid in the right mastoid sinus, which is behind your ear.  We recommend using Flonase, daily for at least a month to improve your symptoms.  Also follow-up with your primary care doctor for further evaluation and treatment as needed.  It is important to drink plenty of fluids, and make sure you are getting plenty of rest.

## 2017-10-28 ENCOUNTER — Encounter: Payer: Self-pay | Admitting: Internal Medicine

## 2017-10-28 ENCOUNTER — Ambulatory Visit: Payer: BLUE CROSS/BLUE SHIELD | Admitting: Internal Medicine

## 2017-10-28 VITALS — BP 124/80 | HR 84 | Temp 98.5°F | Ht 74.0 in | Wt 173.0 lb

## 2017-10-28 DIAGNOSIS — R42 Dizziness and giddiness: Secondary | ICD-10-CM

## 2017-10-28 DIAGNOSIS — H7491 Unspecified disorder of right middle ear and mastoid: Secondary | ICD-10-CM | POA: Diagnosis not present

## 2017-10-28 DIAGNOSIS — R29818 Other symptoms and signs involving the nervous system: Secondary | ICD-10-CM

## 2017-10-28 DIAGNOSIS — Z720 Tobacco use: Secondary | ICD-10-CM

## 2017-10-28 MED ORDER — MECLIZINE HCL 12.5 MG PO TABS: 12.5000 mg | ORAL_TABLET | Freq: Three times a day (TID) | ORAL | 1 refills | Status: DC | PRN

## 2017-10-28 NOTE — Assessment & Plan Note (Signed)
With effusion, but no pain, swelling or fever, cont flonase for now,

## 2017-10-28 NOTE — Assessment & Plan Note (Signed)
Urged to quit 

## 2017-10-28 NOTE — Patient Instructions (Signed)
Please take all new medication as prescribed - the antivert for dizziness  Please continue all other medications as before, and refills have been done if requested.  Please have the pharmacy call with any other refills you may need.  Please keep your appointments with your specialists as you may have planned  You will be contacted regarding the referral for: MRI for the head, neurology referral, and carotid ultrasound

## 2017-10-28 NOTE — Assessment & Plan Note (Signed)
Etiology unclear, will trial antivert prn, also check carotid duplex, MRI head, refer neurology

## 2017-10-28 NOTE — Progress Notes (Signed)
   Subjective:    Patient ID: Kenneth Gilbert, male    DOB: [birth year], 52 y.o.   MRN: [id number]  HPI   Here to f/u with recent worsening dizziness mild intermittent x 2 yrs, worsening recently in severity and frequency, maybe exacerbated by working outdoors at his job but at least makes working quite difficult;  Nothing else seems to make better or worse.  Seen in ED June 11 with CT head with right mastoid effusion, tx with flonase to which he has been compliant but feels no different.  No HA, fever, ST, cough or significant sinus symtpoms.  Cont's to smoke, not ready to quit Past Medical History:  Diagnosis Date  . Arthritis   . ETOH abuse   . Hyperlipidemia    Past Surgical History:  Procedure Laterality Date  . arm surgery Right   . arm surgery Left   . INNER EAR SURGERY     as a child  . SHOULDER SURGERY Left     reports that he has been smoking cigarettes.  He started smoking about 39 years ago. He has been smoking about 1.50 packs per day. He has never used smokeless tobacco. He reports that he has current or past drug history. Drug: Marijuana. He reports that he does not drink alcohol. family history includes Cancer in his father; Cirrhosis in his sister; Heart disease in his brother. Allergies  Allergen Reactions  . Codeine     REACTION: Dizzy   Current Outpatient Medications on File Prior to Visit  Medication Sig Dispense Refill  . fluticasone (FLONASE) 50 MCG/ACT nasal spray Place 2 sprays into both nostrils daily. 16 g 0  . rosuvastatin (CRESTOR) 5 MG tablet Take 1 tablet (5 mg total) by mouth daily. 90 tablet 1   No current facility-administered medications on file prior to visit.    Review of Systems  Constitutional: Negative for other unusual diaphoresis or sweats HENT: Negative for ear discharge or swelling Eyes: Negative for other worsening visual disturbances Respiratory: Negative for stridor or other swelling  Gastrointestinal: Negative for worsening  distension or other blood Genitourinary: Negative for retention or other urinary change Musculoskeletal: Negative for other MSK pain or swelling Skin: Negative for color change or other new lesions Neurological: Negative for worsening tremors and other numbness  Psychiatric/Behavioral: Negative for worsening agitation or other fatigue All other system neg per pt    Objective:   Physical Exam BP 124/80   Pulse 84   Temp 98.5 F (36.9 C) (Oral)   Ht 6\' 2"  (1.88 m)   Wt 173 lb (78.5 kg)   SpO2 96%   BMI 22.21 kg/m  VS noted,  Constitutional: Pt appears in NAD HENT: Head: NCAT.  Right Ear: External ear normal.  Left Ear: External ear normal.  Eyes: . Pupils are equal, round, and reactive to light. Conjunctivae and EOM are normal Nose: without d/c or deformity Neck: Neck supple. Gross normal ROM Cardiovascular: Normal rate and regular rhythm.   Pulmonary/Chest: Effort normal and breath sounds without rales or wheezing.  Neurological: Pt is alert. At baseline orientation, motor 5/5 intact, CN 2-12 intact, gait somewhat ataxic Skin: Skin is warm. No rashes, other new lesions, no LE edema Psychiatric: Pt behavior is normal without agitation  No other exam findings     Assessment & Plan:

## 2017-10-29 ENCOUNTER — Other Ambulatory Visit: Payer: Self-pay | Admitting: Internal Medicine

## 2017-10-29 DIAGNOSIS — R42 Dizziness and giddiness: Secondary | ICD-10-CM

## 2017-10-30 ENCOUNTER — Encounter: Payer: Self-pay | Admitting: Internal Medicine

## 2017-10-30 ENCOUNTER — Ambulatory Visit (HOSPITAL_COMMUNITY)
Admission: RE | Admit: 2017-10-30 | Discharge: 2017-10-30 | Disposition: A | Discharge status: Home / Self Care | Payer: BLUE CROSS/BLUE SHIELD | Source: Ambulatory Visit | Attending: Family | Admitting: Family

## 2017-10-30 ENCOUNTER — Other Ambulatory Visit: Payer: Self-pay | Admitting: Internal Medicine

## 2017-10-30 DIAGNOSIS — G450 Vertebro-basilar artery syndrome: Secondary | ICD-10-CM

## 2017-10-30 DIAGNOSIS — I6521 Occlusion and stenosis of right carotid artery: Secondary | ICD-10-CM | POA: Insufficient documentation

## 2017-10-30 DIAGNOSIS — R9389 Abnormal findings on diagnostic imaging of other specified body structures: Secondary | ICD-10-CM

## 2017-10-30 DIAGNOSIS — R42 Dizziness and giddiness: Secondary | ICD-10-CM | POA: Diagnosis not present

## 2017-10-31 ENCOUNTER — Telehealth: Payer: Self-pay

## 2017-10-31 NOTE — Telephone Encounter (Signed)
-----   Message from Biagio Borg, MD sent at 10/30/2017  2:07 PM EDT ----- Letter sent, cont same tx except  The test results show that your current treatment is OK, as the right carotid shows only moderate blockage which does not require surgical treatment, but the Left Vertebral Artery appears to be much slowed in the blood flow.  This can cause a condition called "vertebrobasilar insufficiency" which just means less blood flow to the brain that can lead to dizziness.  We will need to refer you to Vascular Surgury for further consideration, though you may not need surgury.  Another option locally is for a Stent placed by Interventional Radiology.  But we'll need to see Vascular Surgury first.  Please also see Dr Ronnald Ramp to keep on top of the factors that can make this circulation worse, including any high blood pressure, high cholesterol, smoking, or increased blood sugar.    Shirron to please inform pt, I will do referral to Vascular Surgury

## 2017-10-31 NOTE — Telephone Encounter (Signed)
Called pt, LVM.   CRM created.  

## 2017-10-31 NOTE — Telephone Encounter (Signed)
Pt wanted his results to be clarified where he could understand. He expressed understanding and will wait for the calls to set up the referral appts.

## 2017-10-31 NOTE — Telephone Encounter (Signed)
Patient has called back.  He does have questions in regard.  [facility] has updated patients contact numbers in demographics.

## 2017-11-05 NOTE — Telephone Encounter (Signed)
Pt called [facility] and was advised they do not have referral. Please advise.

## 2017-11-05 NOTE — Telephone Encounter (Signed)
Referral has been sent to Vascular & Vein and pt is aware

## 2017-11-12 ENCOUNTER — Ambulatory Visit
Admission: RE | Admit: 2017-11-12 | Discharge: 2017-11-12 | Disposition: A | Discharge status: Home / Self Care | Payer: BLUE CROSS/BLUE SHIELD | Source: Ambulatory Visit | Attending: Internal Medicine | Admitting: Internal Medicine

## 2017-11-12 ENCOUNTER — Other Ambulatory Visit: Payer: Self-pay | Admitting: Internal Medicine

## 2017-11-12 ENCOUNTER — Encounter: Payer: Self-pay | Admitting: Internal Medicine

## 2017-11-12 DIAGNOSIS — R29818 Other symptoms and signs involving the nervous system: Secondary | ICD-10-CM

## 2017-11-12 DIAGNOSIS — R930 Abnormal findings on diagnostic imaging of skull and head, not elsewhere classified: Secondary | ICD-10-CM

## 2017-11-12 DIAGNOSIS — R410 Disorientation, unspecified: Secondary | ICD-10-CM | POA: Diagnosis not present

## 2017-11-12 DIAGNOSIS — H7090 Unspecified mastoiditis, unspecified ear: Secondary | ICD-10-CM

## 2017-11-13 ENCOUNTER — Telehealth: Payer: Self-pay

## 2017-11-13 NOTE — Telephone Encounter (Signed)
-----   Message from Biagio Borg, MD sent at 11/12/2017 11:47 AM EDT ----- Letter sent, cont same tx except  The test results show that your current treatment is OK, except there is evidence for possible Mastoiditis near the ear.  We should refer you to ENT for further consideration, as this may be causing your dizzy symptoms.    Shirron to please inform pt, I will do referral

## 2017-11-13 NOTE — Telephone Encounter (Signed)
Pt has been informed and expressed understanding.  

## 2017-11-28 ENCOUNTER — Ambulatory Visit: Payer: Self-pay | Admitting: *Deleted

## 2017-11-28 NOTE — Telephone Encounter (Signed)
Please let him know that I don't have another medication to give him for the dizziness; unfortunately, there is not much else we can do here for him. This is a more complicated issue than can be solved with just taking a medication.   Dr. Jenny Reichmann has replicated the same referrals I did for him- he needs to go to ENT, neurology and cardiology;

## 2017-11-28 NOTE — Telephone Encounter (Signed)
Pt called with having vertigo, taking Antivert and it is not helping. He says that it makes him feel worst. He works out in the sun and gets really dizziness and unable to work much. When it happens and goes on it gives him a headache. He takes the Antivert as prescribed but no relief. He has had nausea once, no vomiting.  He has an appointment on the 31 st with the cardiologist. Thinks that once he clears up his "clogged arteries" that that will help. He is requesting another medication for the dizziness. Appointment scheduled per protocol. Will route to [facility].  Reason for Disposition . [1] MODERATE dizziness (e.g., vertigo; feels very unsteady, interferes with normal activities) AND [2] has been evaluated by physician for this  Answer Assessment - Initial Assessment Questions 1. DESCRIPTION: "Describe your dizziness."     dizziness 2. VERTIGO: "Do you feel like either you or the room is spinning or tilting?"      Spinning and tilting 3. LIGHTHEADED: "Do you feel lightheaded?" (e.g., somewhat faint, woozy, weak upon standing)     woozy 4. SEVERITY: "How bad is it?"  "Can you walk?"   - MILD - Feels unsteady but walking normally.   - MODERATE - Feels very unsteady when walking, but not falling; interferes with normal activities (e.g., school, work) .   - SEVERE - Unable to walk without falling (requires assistance).     Moderate to severe 5. ONSET:  "When did the dizziness begin?"     Couple years now worts 6. AGGRAVATING FACTORS: "Does anything make it worse?" (e.g., standing, change in head position)     Standing, change in head position 7. CAUSE: "What do you think is causing the dizziness?"     Not sure 8. RECURRENT SYMPTOM: "Have you had dizziness before?" If so, ask: "When was the last time?" "What happened that time?"     n/a 9. OTHER SYMPTOMS: "Do you have any other symptoms?" (e.g., headache, weakness, numbness, vomiting, earache)     Headache, nauseated  once  Protocols used: DIZZINESS - VERTIGO-A-AH

## 2017-11-28 NOTE — Telephone Encounter (Signed)
Spoke with patient and I will cancel appointment for tomorrow. In the meantime he said he never heard from ENT about his appointment that was done. I will reach out to [facility] and see if she can resend his referral for him unless you advise otherwise?

## 2017-11-28 NOTE — Telephone Encounter (Signed)
Guessing I would need to have Cecille Rubin follow up with his neuro and cardio referrals also.

## 2017-11-29 ENCOUNTER — Ambulatory Visit: Payer: BLUE CROSS/BLUE SHIELD | Admitting: Family

## 2017-11-29 ENCOUNTER — Ambulatory Visit: Payer: Self-pay | Admitting: *Deleted

## 2017-11-29 NOTE — Telephone Encounter (Signed)
Spoke with patient and info given 

## 2017-11-29 NOTE — Telephone Encounter (Signed)
Pt called back with c/o dizziness and wants to know if a blood thinner would work for him. He is not having any different symptoms that he had from yesterday. He is just wanting something so he can go back to work. Pt request call back please.

## 2017-11-29 NOTE — Telephone Encounter (Signed)
Please advise.  (See Nurse Triage note from yesterday)

## 2017-12-03 DIAGNOSIS — H6983 Other specified disorders of Eustachian tube, bilateral: Secondary | ICD-10-CM | POA: Insufficient documentation

## 2017-12-03 DIAGNOSIS — H6993 Unspecified Eustachian tube disorder, bilateral: Secondary | ICD-10-CM | POA: Insufficient documentation

## 2017-12-03 DIAGNOSIS — H748X1 Other specified disorders of right middle ear and mastoid: Secondary | ICD-10-CM | POA: Diagnosis not present

## 2017-12-03 DIAGNOSIS — I6523 Occlusion and stenosis of bilateral carotid arteries: Secondary | ICD-10-CM | POA: Diagnosis not present

## 2017-12-03 DIAGNOSIS — H7202 Central perforation of tympanic membrane, left ear: Secondary | ICD-10-CM | POA: Diagnosis not present

## 2017-12-03 DIAGNOSIS — H903 Sensorineural hearing loss, bilateral: Secondary | ICD-10-CM | POA: Insufficient documentation

## 2017-12-08 ENCOUNTER — Other Ambulatory Visit: Payer: Self-pay | Admitting: Internal Medicine

## 2017-12-08 DIAGNOSIS — E785 Hyperlipidemia, unspecified: Secondary | ICD-10-CM

## 2017-12-11 ENCOUNTER — Ambulatory Visit: Payer: BLUE CROSS/BLUE SHIELD | Admitting: Vascular Surgery

## 2017-12-11 ENCOUNTER — Encounter

## 2017-12-11 ENCOUNTER — Other Ambulatory Visit: Payer: Self-pay

## 2017-12-11 ENCOUNTER — Encounter: Payer: Self-pay | Admitting: Vascular Surgery

## 2017-12-11 VITALS — BP 110/68 | HR 67 | Resp 18 | Ht 74.0 in | Wt 172.0 lb

## 2017-12-11 DIAGNOSIS — I6502 Occlusion and stenosis of left vertebral artery: Secondary | ICD-10-CM

## 2017-12-11 DIAGNOSIS — R42 Dizziness and giddiness: Secondary | ICD-10-CM | POA: Diagnosis not present

## 2017-12-11 DIAGNOSIS — I6509 Occlusion and stenosis of unspecified vertebral artery: Secondary | ICD-10-CM | POA: Insufficient documentation

## 2017-12-11 NOTE — Progress Notes (Signed)
New Carotid Patient  Requested by:  Janith Lima, MD [address], [place] [address]  Reason for consultation: dizziness  History of Present Illness   Kenneth Gilbert is a 52 y.o. (1965/06/24) male who presents with chief complaint: dizziness.  This patient was combative throughout this appointment.  He did not elect to volunteer some of the requested information.    Previous carotid studies demonstrated: RICA 54-27% stenosis, LICA 06-23% stenosis, and possible L vertebral artery stenosis.  Patient has no history of TIA or stroke symptom.  The patient has never had amaurosis fugax or monocular blindness.  The patient has never had facial drooping or hemiplegia.  The patient has never had receptive or expressive aphasia.   The patient has recurrent episodes of dizziness which interfere with his ability to work.   Past Medical History:  Diagnosis Date  . Arthritis   . ETOH abuse   . Hyperlipidemia     Past Surgical History:  Procedure Laterality Date  . arm surgery Right   . arm surgery Left   . INNER EAR SURGERY     as a child  . SHOULDER SURGERY Left     Social History   Socioeconomic History  . Marital status: Divorced    Spouse name: Not on file  . Number of children: Not on file  . Years of education: Not on file  . Highest education level: Not on file  Occupational History  . Not on file  Social Needs  . Financial resource strain: Not on file  . Food insecurity:    Worry: Not on file    Inability: Not on file  . Transportation needs:    Medical: Not on file    Non-medical: Not on file  Tobacco Use  . Smoking status: Current Every Day Smoker    Packs/day: 1.50    Types: Cigarettes    Start date: 06/11/1978  . Smokeless tobacco: Never Used  Substance and Sexual Activity  . Alcohol use: No    Frequency: Never    Comment: occasionally  . Drug use: Yes    Types: Marijuana    Comment: used yesterday 05/29/17  . Sexual activity:  Yes    Partners: Female  Lifestyle  . Physical activity:    Days per week: Not on file    Minutes per session: Not on file  . Stress: Not on file  Relationships  . Social connections:    Talks on phone: Not on file    Gets together: Not on file    Attends religious service: Not on file    Active member of club or organization: Not on file    Attends meetings of clubs or organizations: Not on file    Relationship status: Not on file  . Intimate partner violence:    Fear of current or ex partner: Not on file    Emotionally abused: Not on file    Physically abused: Not on file    Forced sexual activity: Not on file  Other Topics Concern  . Not on file  Social History Narrative  . Not on file    Family History  Problem Relation Age of Onset  . Cancer Father        unknown type  . Cirrhosis Sister   . Heart disease Brother     Current Outpatient Medications  Medication Sig Dispense Refill  . fluticasone (FLONASE) 50 MCG/ACT nasal spray Place 2 sprays into  both nostrils daily. 16 g 0  . meclizine (ANTIVERT) 12.5 MG tablet Take 1 tablet (12.5 mg total) by mouth 3 (three) times daily as needed for dizziness. 30 tablet 1  . rosuvastatin (CRESTOR) 5 MG tablet TAKE 1 TABLET BY MOUTH DAILY 90 tablet 1   No current facility-administered medications for this visit.     Allergies  Allergen Reactions  . Codeine     REACTION: Dizzy    REVIEW OF SYSTEMS (negative unless checked):   Cardiac:  [x]  Chest pain or chest pressure? [x]  Shortness of breath upon activity? []  Shortness of breath when lying flat? []  Irregular heart rhythm?  Vascular:  []  Pain in calf, thigh, or hip brought on by walking? []  Pain in feet at night that wakes you up from your sleep? []  Blood clot in your veins? []  Leg swelling?  Pulmonary:  []  Oxygen at home? []  Productive cough? []  Wheezing?  Neurologic:  []  Sudden weakness in arms or legs? []  Sudden numbness in arms or legs? []  Sudden onset  of difficult speaking or slurred speech? []  Temporary loss of vision in one eye? []  Problems with dizziness?  Gastrointestinal:  []  Blood in stool? []  Vomited blood?  Genitourinary:  []  Burning when urinating? []  Blood in urine?  Psychiatric:  []  Major depression  Hematologic:  []  Bleeding problems? []  Problems with blood clotting?  Dermatologic:  []  Rashes or ulcers?  Constitutional:  []  Fever or chills?  Ear/Nose/Throat:  []  Change in hearing? []  Nose bleeds? []  Sore throat?  Musculoskeletal:  []  Back pain? []  Joint pain? []  Muscle pain?   For VQI Use Only   PRE-ADM LIVING Home  AMB STATUS Ambulatory  CAD Sx None  PRIOR CHF None  STRESS TEST No    Physical Examination     Vitals:   12/11/17 0936 12/11/17 0937  BP: 129/65 110/68  Pulse: 67   Resp: 18   SpO2: 98%   Weight: 172 lb (78 kg)   Height: 6\' 2"  (1.88 m)    Body mass index is 22.08 kg/m.  General Alert, O x 3, WD, NAD, pt was angry throughout the exam which adversely affected his cooperation with exam  Head [place]/AT,    Ear/Nose/ Throat Hearing grossly intact, nares without erythema or drainage,   Eyes PERRL, EOMI     Neck Supple, mid-line trachea,    Pulmonary Sym exp, good B air movt,   Cardiac RRR, Nl S1, S2  Vascular Vessel Right Left  Radial Palpable Palpable  Brachial Palpable Palpable  Carotid Palpable, No Bruit Palpable, No Bruit  Aorta Not palpable N/A  Femoral Palpable Palpable  Popliteal Not palpable Not palpable  PT Palpable Palpable  DP Palpable Palpable    Gastro- intestinal soft, non-distended, non-tender to palpation, No guarding or rebound, no HSM, no masses, no CVAT B, No palpable prominent aortic pulse,    Musculo- skeletal M/S 5/5 throughout  , Extremities without ischemic changes   Neurologic Cranial nerves grossly intact, Pain and light touch intact in extremities, Motor exam as listed above  Psychiatric Inappropriate mood and affect, judgment somewhat  compromise  Dermatologic See M/S exam for extremity exam,   Lymphatic  Palpable lymph nodes: None     Non-invasive Vascular Imaging   B Carotid Duplex (10/30/17):  [name] R ICA stenosis:  40-59% . R VA: patent and antegrade . L ICA stenosis:  40-59% . L VA: high resistive waveform   Outside Studies/Documentation   3 pages of outside documents  were reviewed including: outpatient PCP.   Medical Decision Making   Kenneth Gilbert is a 52 y.o. male who presents with: possible L VA stenosis vs occlusion, dizziness, asx BICA stenosis <80%.   I discussed with the patient the nature of the vertebrobasilar arterial system with redundancy in the vertebral system.  His dizziness likely has NOTHING to do with his L VA artery stenosis vs occlusion.  In some patients this stenosis vs occlusion is due to congential hypoplasia rather than disease. . I recommended: CTA Neck to evaluate the vertebral arterial system. . The patient will follow up with one of my partner as I will be moving onto another practice. . I discussed in depth with the patient the nature of atherosclerosis, and emphasized the importance of maximal medical management including strict control of blood pressure, blood glucose, and lipid levels, obtaining regular exercise, antiplatelet agents, and cessation of smoking.    The patient is currently on a statin: Crestor.   The patient is currently not on anti-platelet as not indicated.  The patient is aware that without maximal medical management the underlying atherosclerotic disease process will progress, limiting the benefit of any interventions.  Thank you for allowing [name] to participate in this patient's care.   Adele Barthel, MD, FACS [facility] Office: [phone] Pager: [phone]  12/11/2017, 9:54 AM

## 2017-12-19 ENCOUNTER — Emergency Department (HOSPITAL_COMMUNITY): Payer: BLUE CROSS/BLUE SHIELD

## 2017-12-19 ENCOUNTER — Encounter (HOSPITAL_COMMUNITY): Payer: Self-pay

## 2017-12-19 ENCOUNTER — Emergency Department (HOSPITAL_COMMUNITY)
Admission: EM | Admit: 2017-12-19 | Discharge: 2017-12-19 | Disposition: A | Discharge status: Home / Self Care | Payer: BLUE CROSS/BLUE SHIELD | Attending: Emergency Medicine | Admitting: Emergency Medicine

## 2017-12-19 DIAGNOSIS — R531 Weakness: Secondary | ICD-10-CM | POA: Diagnosis not present

## 2017-12-19 DIAGNOSIS — F17210 Nicotine dependence, cigarettes, uncomplicated: Secondary | ICD-10-CM | POA: Diagnosis not present

## 2017-12-19 DIAGNOSIS — R42 Dizziness and giddiness: Secondary | ICD-10-CM | POA: Insufficient documentation

## 2017-12-19 DIAGNOSIS — I6523 Occlusion and stenosis of bilateral carotid arteries: Secondary | ICD-10-CM | POA: Diagnosis not present

## 2017-12-19 LAB — RAPID URINE DRUG SCREEN, HOSP PERFORMED
AMPHETAMINES: NOT DETECTED
Barbiturates: NOT DETECTED
Benzodiazepines: NOT DETECTED
COCAINE: NOT DETECTED
Opiates: NOT DETECTED
TETRAHYDROCANNABINOL: POSITIVE — AB

## 2017-12-19 LAB — CBC
HCT: 49 % (ref 39.0–52.0)
HEMOGLOBIN: 16.4 g/dL (ref 13.0–17.0)
MCH: 31.9 pg (ref 26.0–34.0)
MCHC: 33.5 g/dL (ref 30.0–36.0)
MCV: 95.3 fL (ref 78.0–100.0)
Platelets: 148 10*3/uL — ABNORMAL LOW (ref 150–400)
RBC: 5.14 MIL/uL (ref 4.22–5.81)
RDW: 12.4 % (ref 11.5–15.5)
WBC: 6.9 10*3/uL (ref 4.0–10.5)

## 2017-12-19 LAB — I-STAT CHEM 8, ED
BUN: 9 mg/dL (ref 6–20)
Calcium, Ion: 1.01 mmol/L — ABNORMAL LOW (ref 1.15–1.40)
Chloride: 107 mmol/L (ref 98–111)
Creatinine, Ser: 0.8 mg/dL (ref 0.61–1.24)
Glucose, Bld: 103 mg/dL — ABNORMAL HIGH (ref 70–99)
HCT: 48 % (ref 39.0–52.0)
Hemoglobin: 16.3 g/dL (ref 13.0–17.0)
POTASSIUM: 3.9 mmol/L (ref 3.5–5.1)
SODIUM: 142 mmol/L (ref 135–145)
TCO2: 21 mmol/L — ABNORMAL LOW (ref 22–32)

## 2017-12-19 LAB — URINALYSIS, ROUTINE W REFLEX MICROSCOPIC
BACTERIA UA: NONE SEEN
Bilirubin Urine: NEGATIVE
Glucose, UA: NEGATIVE mg/dL
Hgb urine dipstick: NEGATIVE
Ketones, ur: NEGATIVE mg/dL
Leukocytes, UA: NEGATIVE
NITRITE: NEGATIVE
Protein, ur: 30 mg/dL — AB
SPECIFIC GRAVITY, URINE: 1.019 (ref 1.005–1.030)
pH: 8 (ref 5.0–8.0)

## 2017-12-19 LAB — COMPREHENSIVE METABOLIC PANEL
ALBUMIN: 4.2 g/dL (ref 3.5–5.0)
ALT: 17 U/L (ref 0–44)
ANION GAP: 16 — AB (ref 5–15)
AST: 25 U/L (ref 15–41)
Alkaline Phosphatase: 73 U/L (ref 38–126)
BUN: 7 mg/dL (ref 6–20)
CO2: 19 mmol/L — AB (ref 22–32)
Calcium: 9.4 mg/dL (ref 8.9–10.3)
Chloride: 108 mmol/L (ref 98–111)
Creatinine, Ser: 0.97 mg/dL (ref 0.61–1.24)
GFR calc Af Amer: >60 mL/min (ref >60)
GFR calc non Af Amer: >60 mL/min (ref >60)
GLUCOSE: 108 mg/dL — AB (ref 70–99)
Potassium: 3.9 mmol/L (ref 3.5–5.1)
SODIUM: 143 mmol/L (ref 135–145)
Total Bilirubin: 0.8 mg/dL (ref 0.3–1.2)
Total Protein: 7 g/dL (ref 6.5–8.1)

## 2017-12-19 LAB — DIFFERENTIAL
ABS IMMATURE GRANULOCYTES: 0 10*3/uL (ref 0.0–0.1)
Basophils Absolute: 0 10*3/uL (ref 0.0–0.1)
Basophils Relative: 0 %
EOS PCT: 1 %
Eosinophils Absolute: 0.1 10*3/uL (ref 0.0–0.7)
IMMATURE GRANULOCYTES: 0 %
Lymphocytes Relative: 59 %
Lymphs Abs: 4 10*3/uL (ref 0.7–4.0)
Monocytes Absolute: 0.5 10*3/uL (ref 0.1–1.0)
Monocytes Relative: 7 %
NEUTROS ABS: 2.3 10*3/uL (ref 1.7–7.7)
Neutrophils Relative %: 33 %

## 2017-12-19 LAB — CBG MONITORING, ED: Glucose-Capillary: 107 mg/dL — ABNORMAL HIGH (ref 70–99)

## 2017-12-19 LAB — ETHANOL: Alcohol, Ethyl (B): <10 mg/dL (ref <10)

## 2017-12-19 LAB — I-STAT TROPONIN, ED: Troponin i, poc: 0 ng/mL (ref 0.00–0.08)

## 2017-12-19 MED ORDER — IOPAMIDOL (ISOVUE-370) INJECTION 76%
INTRAVENOUS | Status: AC
  Filled 2017-12-19: qty 50

## 2017-12-19 MED ORDER — LORAZEPAM 2 MG/ML IJ SOLN
1.0000 mg | Freq: Once | INTRAMUSCULAR | Status: AC
  Administered 2017-12-19: 1 mg via INTRAVENOUS
  Filled 2017-12-19: qty 1

## 2017-12-19 MED ORDER — IOPAMIDOL (ISOVUE-370) INJECTION 76%
50.0000 mL | Freq: Once | INTRAVENOUS | Status: AC | PRN
  Administered 2017-12-19: 50 mL via INTRAVENOUS

## 2017-12-19 MED ORDER — AMOXICILLIN 500 MG PO CAPS: 500.0000 mg | ORAL_CAPSULE | Freq: Three times a day (TID) | ORAL | 0 refills | Status: DC

## 2017-12-19 NOTE — ED Provider Notes (Signed)
[facility] EMERGENCY DEPARTMENT Provider Note   CSN: [id number] Arrival date & time: 12/19/17  0945     History   Chief Complaint Chief Complaint  Patient presents with  . Dizziness  . Weakness    HPI Kenneth Gilbert is a 52 y.o. male.  The history is provided by the patient and a relative. No language interpreter was used.  Dizziness  Associated symptoms: weakness   Weakness  Primary symptoms include dizziness.   Kenneth Gilbert is a 52 y.o. male who presents to the Emergency Department complaining of dizziness. Presents to the emergency department accompanied by his son for evaluation of dizziness. Level V caveat due to poor historian. Most of the history is provided by the patient's son has the patient does not want to speak much. Son reports that he has been experiencing persistent dizziness for a long time. He was at work today when he developed sudden onset headache and worsening of his dizziness with associated diaphoresis. The headache is located at the top of his head. Dizziness is described as a feeling as if he is going to pass out. He has associated numbness throughout his entire body as well as generalized weakness. Symptoms are severe and constant in nature. Past Medical History:  Diagnosis Date  . Arthritis   . ETOH abuse   . Hyperlipidemia     Patient Active Problem List   Diagnosis Date Noted  . Vertebral artery stenosis 12/11/2017  . Mastoid disorder, right 10/28/2017  . Aortic atherosclerosis ([place]) 07/26/2017  . Aortic ectasia, abdominal ([place]) 07/26/2017  . Dizziness 06/12/2017  . Chronic vascular disorder of intestine ([place]) 06/12/2017  . Need for Tdap vaccination 06/12/2017  . Hyperlipidemia with target LDL less than 100 06/12/2017  . Need for hepatitis C screening test 06/11/2017  . Routine general medical examination at a health care facility 06/11/2017  . Periumbilical abdominal pain 06/11/2017  . Tobacco abuse 06/11/2017  .  HEARING DEFICIT 07/12/2009  . GERD 07/12/2009    Past Surgical History:  Procedure Laterality Date  . arm surgery Right   . arm surgery Left   . INNER EAR SURGERY     as a child  . SHOULDER SURGERY Left         Home Medications    Prior to Admission medications   Medication Sig Start Date End Date Taking? Authorizing Provider  amoxicillin (AMOXIL) 500 MG capsule Take 1 capsule (500 mg total) by mouth 3 (three) times daily. 12/19/17   Quintella Reichert, MD  fluticasone [facility]) 50 MCG/ACT nasal spray Place 2 sprays into both nostrils daily. Patient not taking: Reported on 12/19/2017 10/22/17   Daleen Bo, MD  meclizine (ANTIVERT) 12.5 MG tablet Take 1 tablet (12.5 mg total) by mouth 3 (three) times daily as needed for dizziness. Patient not taking: Reported on 12/19/2017 10/28/17 10/28/18  Biagio Borg, MD  rosuvastatin (CRESTOR) 5 MG tablet TAKE 1 TABLET BY MOUTH DAILY Patient not taking: Reported on 12/19/2017 12/08/17   Janith Lima, MD    Family History Family History  Problem Relation Age of Onset  . Cancer Father        unknown type  . Cirrhosis Sister   . Heart disease Brother     Social History Social History   Tobacco Use  . Smoking status: Current Every Day Smoker    Packs/day: 1.50    Types: Cigarettes    Start date: 06/11/1978  . Smokeless tobacco: Never Used  Substance Use Topics  . Alcohol use: No    Frequency: Never    Comment: occasionally  . Drug use: Yes    Types: Marijuana    Comment: used yesterday 05/29/17     Allergies   Codeine   Review of Systems Review of Systems  Neurological: Positive for dizziness and weakness.  All other systems reviewed and are negative.    Physical Exam Updated Vital Signs BP 126/80   Pulse 66   Temp (!) 97.3 F (36.3 C) (Oral)   Resp (!) 26   Ht 6\' 2"  (1.88 m)   Wt 78 kg   SpO2 97%   BMI 22.08 kg/m   Physical Exam  Constitutional: He is oriented to person, place, and time. He appears  well-developed and well-nourished.  HENT:  Head: Normocephalic and atraumatic.  Cardiovascular: Normal rate and regular rhythm.  No murmur heard. Pulmonary/Chest: Effort normal and breath sounds normal. No respiratory distress.  Tachypnea  Abdominal: Soft. There is no tenderness. There is no rebound and no guarding.  Musculoskeletal: He exhibits no edema or tenderness.  Neurological: He is alert and oriented to person, place, and time.  Generalized weakness. Poor effort on strength testing. Tremors of all four extremities. No facial asymmetry.  Skin: Skin is warm and dry.  Psychiatric:  Anxious appearing. Poor eye contact.  Nursing note and vitals reviewed.    ED Treatments / Results  Labs (all labs ordered are listed, but only abnormal results are displayed) Labs Reviewed  CBC - Abnormal; Notable for the following components:      Result Value   Platelets 148 (*)    All other components within normal limits  COMPREHENSIVE METABOLIC PANEL - Abnormal; Notable for the following components:   CO2 19 (*)    Glucose, Bld 108 (*)    Anion gap 16 (*)    All other components within normal limits  RAPID URINE DRUG SCREEN, HOSP PERFORMED - Abnormal; Notable for the following components:   Tetrahydrocannabinol POSITIVE (*)    All other components within normal limits  URINALYSIS, ROUTINE W REFLEX MICROSCOPIC - Abnormal; Notable for the following components:   Protein, ur 30 (*)    All other components within normal limits  CBG MONITORING, ED - Abnormal; Notable for the following components:   Glucose-Capillary 107 (*)    All other components within normal limits  I-STAT CHEM 8, ED - Abnormal; Notable for the following components:   Glucose, Bld 103 (*)    Calcium, Ion 1.01 (*)    TCO2 21 (*)    All other components within normal limits  ETHANOL  DIFFERENTIAL  I-STAT TROPONIN, ED    EKG EKG Interpretation  Date/Time:  Thursday December 19 2017 10:03:04 EDT Ventricular Rate:    77 PR Interval:    QRS Duration: 111 QT Interval:  400 QTC Calculation: 453 R Axis:   15 Text Interpretation:  Sinus rhythm Anterior infarct, old Minimal ST depression, inferior leads Confirmed by Quintella Reichert [phone]) on 12/19/2017 10:16:06 AM   Radiology Ct Angio Head W Or Wo Contrast  Result Date: 12/19/2017 CLINICAL DATA:  Dizziness and weakness over the last month. EXAM: CT ANGIOGRAPHY HEAD AND NECK TECHNIQUE: Multidetector CT imaging of the head and neck was performed using the standard protocol during bolus administration of intravenous contrast. Multiplanar CT image reconstructions and MIPs were obtained to evaluate the vascular anatomy. Carotid stenosis measurements (when applicable) are obtained utilizing NASCET criteria, using the distal internal carotid diameter as  the denominator. CONTRAST:  66mL ISOVUE-370 IOPAMIDOL (ISOVUE-370) INJECTION 76% COMPARISON:  MRI 11/12/2017.  CT 10/22/2017. FINDINGS: CT HEAD FINDINGS Brain: The brain shows a normal appearance without evidence of malformation, atrophy, old or acute small or large vessel infarction, mass lesion, hemorrhage, hydrocephalus or extra-axial collection. Vascular: No hyperdense vessel. No evidence of atherosclerotic calcification. Skull: Normal.  No traumatic finding.  No focal bone lesion. Sinuses/Orbits: No significant paranasal sinus finding. Orbits are negative. Opacification of mastoid air cells on the right. Fluid in the right middle ear. Other: None significant CTA NECK FINDINGS Aortic arch: Aortic atherosclerosis. No aneurysm or dissection. No brachiocephalic vessel origin stenosis. Right carotid system: Common carotid artery shows some atherosclerotic plaque but is widely patent to the bifurcation region. At the carotid bifurcation, there is soft and calcified plaque. Minimal diameter in the ICA bulb is 3 mm. Compared to a more distal cervical ICA diameter of 5 mm, this indicates a 40% stenosis. Left carotid system: Left common  carotid artery shows some atherosclerotic plaque but is widely patent to the bifurcation region. There is soft and calcified plaque at the carotid bifurcation and ICA bulb. Minimal diameter measures 4.3 mm. Compared to a more distal cervical ICA diameter of 5 mm, this indicates a 10% stenosis. Vertebral arteries: There is calcified plaque at both vertebral artery origins but no stenosis greater than 20%. The right vertebral artery is dominant. Both vertebral arteries appear widely patent through the cervical region to the foramen magnum. There is atherosclerotic disease of the left subclavian artery proximal to the left vertebral artery origin with soft and calcified plaque, but no stenosis greater than 50%. Skeleton: Mild midcervical spondylosis. Other neck: No mass or lymphadenopathy. Upper chest: Upper lobe emphysema. No acute or focal finding otherwise. Review of the MIP images confirms the above findings CTA HEAD FINDINGS Anterior circulation: Both internal carotid arteries are patent through the skull base and siphon regions. There is calcified plaque in the carotid siphons but no stenosis greater than 20%. The anterior and middle cerebral vessels are patent without proximal stenosis, aneurysm or vascular malformation. Posterior circulation: Both vertebral arteries patent at and through the foramen magnum. The left vertebral artery supplies PICA and gives a small contribution to the basilar. The right vertebral artery is dominant, supplying the majority of the flow to the basilar artery. The basilar artery is tortuous but does not show flow limiting stenosis. Superior cerebellar and posterior cerebral arteries are patent. Venous sinuses: Patent and normal. Anatomic variants: None significant. Delayed phase: No abnormal enhancement. Review of the MIP images confirms the above findings IMPRESSION: Normal appearance of the brain itself. Fluid in the right mastoid air cells and middle ear. Could this relate to the  patient's dizziness? Atherosclerotic disease at both carotid bifurcations. 40% stenosis of the proximal ICA on the right. 10% stenosis of the proximal ICA on the left. Carotid siphon atherosclerotic calcification but without stenosis greater than 20%. No more distal anterior circulation lesions seen. Calcified plaque at both vertebral artery origins, but without stenosis greater than 20%. Beyond that, no posterior circulation stenosis or occlusion is identified. Atherosclerotic disease of the left subclavian artery proximal to the left vertebral artery origin, with soft and calcified plaque, but no stenosis greater than 50%. Electronically Signed   By: Nelson Chimes M.D.   On: 12/19/2017 12:03   Ct Angio Neck W And/or Wo Contrast  Result Date: 12/19/2017 CLINICAL DATA:  Dizziness and weakness over the last month. EXAM: CT ANGIOGRAPHY HEAD AND NECK  TECHNIQUE: Multidetector CT imaging of the head and neck was performed using the standard protocol during bolus administration of intravenous contrast. Multiplanar CT image reconstructions and MIPs were obtained to evaluate the vascular anatomy. Carotid stenosis measurements (when applicable) are obtained utilizing NASCET criteria, using the distal internal carotid diameter as the denominator. CONTRAST:  71mL ISOVUE-370 IOPAMIDOL (ISOVUE-370) INJECTION 76% COMPARISON:  MRI 11/12/2017.  CT 10/22/2017. FINDINGS: CT HEAD FINDINGS Brain: The brain shows a normal appearance without evidence of malformation, atrophy, old or acute small or large vessel infarction, mass lesion, hemorrhage, hydrocephalus or extra-axial collection. Vascular: No hyperdense vessel. No evidence of atherosclerotic calcification. Skull: Normal.  No traumatic finding.  No focal bone lesion. Sinuses/Orbits: No significant paranasal sinus finding. Orbits are negative. Opacification of mastoid air cells on the right. Fluid in the right middle ear. Other: None significant CTA NECK FINDINGS Aortic arch:  Aortic atherosclerosis. No aneurysm or dissection. No brachiocephalic vessel origin stenosis. Right carotid system: Common carotid artery shows some atherosclerotic plaque but is widely patent to the bifurcation region. At the carotid bifurcation, there is soft and calcified plaque. Minimal diameter in the ICA bulb is 3 mm. Compared to a more distal cervical ICA diameter of 5 mm, this indicates a 40% stenosis. Left carotid system: Left common carotid artery shows some atherosclerotic plaque but is widely patent to the bifurcation region. There is soft and calcified plaque at the carotid bifurcation and ICA bulb. Minimal diameter measures 4.3 mm. Compared to a more distal cervical ICA diameter of 5 mm, this indicates a 10% stenosis. Vertebral arteries: There is calcified plaque at both vertebral artery origins but no stenosis greater than 20%. The right vertebral artery is dominant. Both vertebral arteries appear widely patent through the cervical region to the foramen magnum. There is atherosclerotic disease of the left subclavian artery proximal to the left vertebral artery origin with soft and calcified plaque, but no stenosis greater than 50%. Skeleton: Mild midcervical spondylosis. Other neck: No mass or lymphadenopathy. Upper chest: Upper lobe emphysema. No acute or focal finding otherwise. Review of the MIP images confirms the above findings CTA HEAD FINDINGS Anterior circulation: Both internal carotid arteries are patent through the skull base and siphon regions. There is calcified plaque in the carotid siphons but no stenosis greater than 20%. The anterior and middle cerebral vessels are patent without proximal stenosis, aneurysm or vascular malformation. Posterior circulation: Both vertebral arteries patent at and through the foramen magnum. The left vertebral artery supplies PICA and gives a small contribution to the basilar. The right vertebral artery is dominant, supplying the majority of the flow to the  basilar artery. The basilar artery is tortuous but does not show flow limiting stenosis. Superior cerebellar and posterior cerebral arteries are patent. Venous sinuses: Patent and normal. Anatomic variants: None significant. Delayed phase: No abnormal enhancement. Review of the MIP images confirms the above findings IMPRESSION: Normal appearance of the brain itself. Fluid in the right mastoid air cells and middle ear. Could this relate to the patient's dizziness? Atherosclerotic disease at both carotid bifurcations. 40% stenosis of the proximal ICA on the right. 10% stenosis of the proximal ICA on the left. Carotid siphon atherosclerotic calcification but without stenosis greater than 20%. No more distal anterior circulation lesions seen. Calcified plaque at both vertebral artery origins, but without stenosis greater than 20%. Beyond that, no posterior circulation stenosis or occlusion is identified. Atherosclerotic disease of the left subclavian artery proximal to the left vertebral artery origin, with soft and  calcified plaque, but no stenosis greater than 50%. Electronically Signed   By: Nelson Chimes M.D.   On: 12/19/2017 12:03    Procedures Procedures (including critical care time)  Medications Ordered in ED Medications  iopamidol (ISOVUE-370) 76 % injection (has no administration in time range)  LORazepam (ATIVAN) injection 1 mg (1 mg Intravenous Given 12/19/17 1025)  iopamidol (ISOVUE-370) 76 % injection 50 mL (50 mLs Intravenous Contrast Given 12/19/17 1126)     Initial Impression / Assessment and Plan / ED Course  I have reviewed the triage vital signs and the nursing notes.  Pertinent labs & imaging results that were available during my care of the patient were reviewed by me and considered in my medical decision making (see chart for details).     Patient here for evaluation of dizziness, has a history of chronic dizziness. He was hyperventilating and tremulous on initial evaluation. On  reassessment after Ativan he is calm and appropriate. He does endorse persistent dizziness but has no focal neurologic deficits. CTA head and neck obtained given patient has been evaluated by vascular surgery with recommendation for CTA to evaluate for carotid stenosis. CTA is negative for significant stenosis. Right TM is opacified but not erythematous, mastoid is nontender, presentation is not consistent with mastoiditis. Will provide prescription for amoxicillin if he develops ear pain. Presentation is not consistent with cardiogenic dizziness or PE. Discussed with patient home care for dizziness. Discussed outpatient follow-up and return precautions.  Final Clinical Impressions(s) / ED Diagnoses   Final diagnoses:  Dizziness    ED Discharge Orders         Ordered    Ambulatory referral to Neurology  Status:  Canceled    Comments:  An appointment is requested in approximately: 2 weeks   12/19/17 1230    amoxicillin (AMOXIL) 500 MG capsule  3 times daily     12/19/17 1231    Ambulatory referral to Neurology    Comments:  An appointment is requested in approximately: 2 weeks   12/19/17 1232           Quintella Reichert, MD 12/19/17 1504

## 2017-12-19 NOTE — ED Notes (Signed)
Got patient into a gown on the monitor did ekg shown to Dr Ralene Bathe patient is resting with family at bedside

## 2017-12-19 NOTE — Discharge Instructions (Signed)
The cause of your symptoms was not identified today.  Please follow up with your family doctor for further evaluation.  Start taking the antibiotics if you get pain in your right ear.

## 2017-12-19 NOTE — ED Notes (Signed)
Pt given urine sample at this time.

## 2017-12-19 NOTE — ED Notes (Signed)
Pt stable, ambulatory, states understanding of discharge instructions 

## 2017-12-19 NOTE — ED Triage Notes (Signed)
Pt c/o dizziness and weakness all over. Per pt, symptoms started 30 minutes ago.

## 2018-01-07 ENCOUNTER — Encounter: Payer: Self-pay | Admitting: Vascular Surgery

## 2018-01-07 ENCOUNTER — Telehealth: Payer: Self-pay | Admitting: Internal Medicine

## 2018-01-07 ENCOUNTER — Other Ambulatory Visit: Payer: Self-pay | Admitting: Internal Medicine

## 2018-01-07 ENCOUNTER — Ambulatory Visit (INDEPENDENT_AMBULATORY_CARE_PROVIDER_SITE_OTHER): Payer: BLUE CROSS/BLUE SHIELD | Admitting: Vascular Surgery

## 2018-01-07 ENCOUNTER — Other Ambulatory Visit: Payer: Self-pay

## 2018-01-07 ENCOUNTER — Ambulatory Visit
Admission: RE | Admit: 2018-01-07 | Discharge: 2018-01-07 | Disposition: A | Discharge status: Home / Self Care | Payer: BLUE CROSS/BLUE SHIELD | Source: Ambulatory Visit | Attending: Vascular Surgery | Admitting: Vascular Surgery

## 2018-01-07 VITALS — BP 107/68 | HR 73 | Temp 97.8°F | Resp 16 | Ht 74.0 in | Wt 176.0 lb

## 2018-01-07 DIAGNOSIS — R42 Dizziness and giddiness: Secondary | ICD-10-CM

## 2018-01-07 DIAGNOSIS — I6502 Occlusion and stenosis of left vertebral artery: Secondary | ICD-10-CM

## 2018-01-07 NOTE — Telephone Encounter (Signed)
Neurology referral entered

## 2018-01-07 NOTE — Telephone Encounter (Signed)
Copied from [place]. Topic: Inquiry >> Jan 07, 2018 11:08 AM Oliver Pila B wrote: Reason for CRM: pt called to speak w/ a nurse of pcp to speak about the next step of his treatment for vertigo; contact pt to advise

## 2018-01-07 NOTE — Progress Notes (Signed)
[facility] [place]  Patient name: Kenneth Gilbert MRN: [id number] DOB: [birth year] Sex: male  REASON FOR VISIT: Follow-up recent CT angiogram of neck  HPI: Kenneth Gilbert is a 52 y.o. male here today for follow-up.  He had seen Dr. Bridgett Larsson in our office for evaluation of extracranial cerebrovascular occlusive disease.  Duplex revealed moderate right carotid stenosis and no significant left carotid stenosis.  There was some suggestion of vertebral artery disease.  He underwent CT angiogram on 12/19/2017 and is here today for discussion of this.  The patient reports a severe episodes of dizziness.  This is not provoked by any particular activity.  He does not have any focal neurologic deficits.  Past Medical History:  Diagnosis Date  . Arthritis   . ETOH abuse   . Hyperlipidemia     Family History  Problem Relation Age of Onset  . Cancer Father        unknown type  . Cirrhosis Sister   . Heart disease Brother     SOCIAL HISTORY: Social History   Tobacco Use  . Smoking status: Current Every Day Smoker    Packs/day: 2.00    Types: Cigarettes    Start date: 06/11/1978  . Smokeless tobacco: Never Used  Substance Use Topics  . Alcohol use: No    Frequency: Never    Comment: occasionally    Allergies  Allergen Reactions  . Codeine     REACTION: Dizzy    Current Outpatient Medications  Medication Sig Dispense Refill  . meclizine (ANTIVERT) 12.5 MG tablet Take 1 tablet (12.5 mg total) by mouth 3 (three) times daily as needed for dizziness. (Patient not taking: Reported on 01/07/2018) 30 tablet 1   No current facility-administered medications for this visit.     REVIEW OF SYSTEMS:  [X]  denotes positive finding, [ ]  denotes negative finding Cardiac  Comments:  Chest pain or chest pressure:    Shortness of breath upon exertion:    Short of breath when lying flat:    Irregular heart rhythm:        Vascular      Pain in calf, thigh, or hip brought on by ambulation: x   Pain in feet at night that wakes you up from your sleep:     Blood clot in your veins:    Leg swelling:           PHYSICAL EXAM: Vitals:   01/07/18 1035 01/07/18 1038  BP: 109/65 107/68  Pulse: 73   Resp: 16   Temp: 97.8 F (36.6 C)   TempSrc: Oral   SpO2: 98%   Weight: 176 lb (79.8 kg)   Height: 6\' 2"  (1.88 m)     GENERAL: The patient is a well-nourished male, in no acute distress. The vital signs are documented above. CARDIOVASCULAR: Carotid arteries without bruits bilaterally.  2+ radial pulses bilaterally PULMONARY: There is good air exchange  MUSCULOSKELETAL: There are no major deformities or cyanosis. NEUROLOGIC: No focal weakness or paresthesias are detected. SKIN: There are no ulcers or rashes noted. PSYCHIATRIC: The patient has a normal affect.  DATA:  Reviewed his CT images and discussed these at length with the patient.  This shows 40% right internal carotid artery stenosis at the bifurcation.  10% left internal carotid artery stenosis at the bifurcation.  He does have plaque at the vertebral arteries but less than 20% narrowing with no flow-limiting stenosis.  There is less than 50% stenosis in  his left subclavian proximal to the left vertebral takeoff.  MEDICAL ISSUES: I discussed this at length with the patient.  Explained that fortunately he has no need for treatment of his mild extracranial cerebrovascular occlusive disease.  I explained that unfortunately there is nothing in his studies that would explain his dizziness which is quite limiting to him.  He is frustrated at not being able to come to the diagnosis.  He will continue his discussion with Dr. Ronnald Ramp and see [name] again on an as-needed basis    Rosetta Posner, MD [facility] Tel [phone] Pager [phone]

## 2018-01-07 NOTE — Telephone Encounter (Signed)
Pt informed that referral was entered and pt stated that he has an appt scheduled for this Thursday.

## 2018-01-09 ENCOUNTER — Encounter: Payer: Self-pay | Admitting: Neurology

## 2018-01-09 ENCOUNTER — Ambulatory Visit: Payer: BLUE CROSS/BLUE SHIELD | Admitting: Neurology

## 2018-01-09 VITALS — BP 117/66 | HR 62 | Ht 74.0 in | Wt 179.0 lb

## 2018-01-09 DIAGNOSIS — G43709 Chronic migraine without aura, not intractable, without status migrainosus: Secondary | ICD-10-CM | POA: Diagnosis not present

## 2018-01-09 DIAGNOSIS — R42 Dizziness and giddiness: Secondary | ICD-10-CM | POA: Insufficient documentation

## 2018-01-09 DIAGNOSIS — IMO0002 Reserved for concepts with insufficient information to code with codable children: Secondary | ICD-10-CM

## 2018-01-09 MED ORDER — RIZATRIPTAN BENZOATE 5 MG PO TBDP: 5.0000 mg | ORAL_TABLET | ORAL | 6 refills | Status: DC | PRN

## 2018-01-09 MED ORDER — NORTRIPTYLINE HCL 25 MG PO CAPS: 50.0000 mg | ORAL_CAPSULE | Freq: Every day | ORAL | 11 refills | Status: DC

## 2018-01-09 NOTE — Progress Notes (Signed)
PATIENT: Kenneth Gilbert DOB: 1966/05/07  Chief Complaint  Patient presents with  . Dizziness    Orthostatic Vitals: Lying: 117/66, 62, Sitting: 110/67, 66, Standing: 119/67, 68, Standing x 3 minutes: 115/70, 77. ED follow up from 12/19/17.  From ED: Sudden onset headache and worsening of his dizziness with associated diaphoresis. The headache is located at the top of his head. Dizziness is described as a feeling as if he is going to pass out. He has associated numbness throughout his entire body as well as generalized weakness. Dizzy spells have been present for years but are more frequent now.  [name] PCP    Janith Lima, MD     HISTORICAL  Kenneth Gilbert is a 52 year old male, seen in request by his primary care physician Dr. Ronnald Ramp, Arvid Right for evaluation of dizziness, initial evaluation was on January 09, 2018,  He reported gradual onset of dizziness since 2017, he describes his dizziness as woozy headed, it can happen in sitting down, standing up position, but he is a poor historian, it is hard to elaborate on the detail of his symptoms, he did have a history of bilateral otitis media, with hearing loss, worse on the left side, wearing left side hearing aid/amplifier from [place],  In the background of frequent lightheadedness, he also reported spells of sudden onset spinning sensation, he presented to the emergency room on December 19, 2017, 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  On further questioning, patient reported frequent headaches, pounding, severe, with associated dizziness, sometimes nausea, also complains of difficulty sleeping, sometimes during intense vertigo and headaches, he had transient  loss of consciousness for a few minutes,  MRI of the brain without contrast in July 2019 was normal, chronic right mid ear and mastoid inflammatory changes,  CTA head and neck: no significant stenosis.  Lab in August 2019, creat 0.8, Hg 16.3, UDS is positive for marijuana, CMP showed mild elevated glucose 108, cbc, alcohol level less than 10  REVIEW OF SYSTEMS: Full 14 system review of systems performed and notable only for blurred vision, ringing in  ears, feeling hot, feeling cold, memory loss, confusion, headaches, numbness, weakness, passing out, sleepiness, not enough sleep, disinterested in activities, decreased energy.  ALLERGIES: Allergies  Allergen Reactions  . Codeine     REACTION: Dizzy    HOME MEDICATIONS: Current Outpatient Medications  Medication Sig Dispense Refill  . meclizine (ANTIVERT) 12.5 MG tablet Take 1 tablet (12.5 mg total) by mouth 3 (three) times daily as needed for dizziness. 30 tablet 1  . rosuvastatin (CRESTOR) 5 MG tablet Take 5 mg by mouth daily.     No current facility-administered medications for this visit.     PAST MEDICAL HISTORY: Past Medical History:  Diagnosis Date  . Arthritis   . Dizziness   . ETOH abuse   . Hard of hearing   . Hyperlipidemia     PAST SURGICAL HISTORY: Past Surgical History:  Procedure Laterality Date  . arm surgery Right   . arm surgery Left   . INNER EAR SURGERY     as a child  . SHOULDER SURGERY Left     FAMILY HISTORY: Family History  Problem Relation Age of Onset  . Cancer Father  unknown type  . Cirrhosis Sister   . Heart disease Brother   . Heart disease Mother     SOCIAL HISTORY: Social History   Socioeconomic History  . Marital status: Divorced    Spouse name: Not on file  . Number of children: 4  . Years of education: 77  . Highest education level: High school graduate  Occupational History  . Occupation: Sales executive, oil changes, rotates tires  Social Needs  . Financial  resource strain: Not on file  . Food insecurity:    Worry: Not on file    Inability: Not on file  . Transportation needs:    Medical: Not on file    Non-medical: Not on file  Tobacco Use  . Smoking status: Current Every Day Smoker    Packs/day: 1.00    Types: Cigarettes    Start date: 06/11/1978  . Smokeless tobacco: Never Used  Substance and Sexual Activity  . Alcohol use: No    Frequency: Never    Comment: 01/09/18 - reports no use in 3 years  . Drug use: Yes    Types: Marijuana    Comment: 01/09/18 - uses marijuana occasionally  . Sexual activity: Yes    Partners: Female  Lifestyle  . Physical activity:    Days per week: Not on file    Minutes per session: Not on file  . Stress: Not on file  Relationships  . Social connections:    Talks on phone: Not on file    Gets together: Not on file    Attends religious service: Not on file    Active member of club or organization: Not on file    Attends meetings of clubs or organizations: Not on file    Relationship status: Not on file  . Intimate partner violence:    Fear of current or ex partner: Not on file    Emotionally abused: Not on file    Physically abused: Not on file    Forced sexual activity: Not on file  Other Topics Concern  . Not on file  Social History Narrative   Lives at home with his son.   Caffeine use:  Drinks 3-4 cans of [place] tea (20oz cans)   Right-handed.     PHYSICAL EXAM   Vitals:   01/09/18 1412  BP: 117/66  Pulse: 62  Weight: 179 lb (81.2 kg)  Height: 6\' 2"  (1.88 m)    Not recorded      Body mass index is 22.98 kg/m.  PHYSICAL EXAMNIATION:  Gen: NAD, conversant, well nourised, obese, well groomed                     Cardiovascular: Regular rate rhythm, no peripheral edema, warm, nontender. Eyes: Conjunctivae clear without exudates or hemorrhage Neck: Supple, no carotid bruits. Pulmonary: Clear to auscultation bilaterally   NEUROLOGICAL EXAM:  MENTAL STATUS: Speech:     Speech is normal; fluent and spontaneous with normal comprehension.  Cognition:     Orientation to time, place and person     Normal recent and remote memory     Normal Attention span and concentration     Normal Language, naming, repeating,spontaneous speech     Fund of knowledge   CRANIAL NERVES: CN II: Visual fields are full to confrontation. Fundoscopic exam is normal with sharp discs and no vascular changes. Pupils are round equal and briskly reactive to light. CN III, IV, VI: extraocular movement are normal. No ptosis. CN  V: Facial sensation is intact to pinprick in all 3 divisions bilaterally. Corneal responses are intact.  CN VII: Face is symmetric with normal eye closure and smile. CN VIII: Air conduction more than bony conduction, evidence of bilateral tympanic membrane perforation, decreased hearing bilaterally left worse than the right CN IX, X: Palate elevates symmetrically. Phonation is normal. CN XI: Head turning and shoulder shrug are intact CN XII: Tongue is midline with normal movements and no atrophy.  MOTOR: There is no pronator drift of out-stretched arms. Muscle bulk and tone are normal. Muscle strength is normal.  REFLEXES: Reflexes are 2+ and symmetric at the biceps, triceps, knees, and ankles. Plantar responses are flexor.  SENSORY: Intact to light touch, pinprick, positional sensation and vibratory sensation are intact in fingers and toes.  COORDINATION: Rapid alternating movements and fine finger movements are intact. There is no dysmetria on finger-to-nose and heel-knee-shin.    GAIT/STANCE: Posture is normal. Gait is steady with normal steps, base, arm swing, and turning. Heel and toe walking are normal. Tandem gait is normal.  Romberg is absent.   DIAGNOSTIC DATA (LABS, IMAGING, TESTING) - I reviewed patient records, labs, notes, testing and imaging myself where available.   ASSESSMENT AND PLAN  Kenneth Gilbert is a 52 y.o. male    Dizziness Chronic headache with migraine features Passing out spells  He is a poor historian, complains of daily lightheaded sensation, with superimposed vertigo, headaches,  also had a history of chronic otitis media, tympanic perforation, decreased hearing bilaterally left worse than right  differentiation diagnosis including migraine headaches, vestibular hypofunction, deconditioning, anxiety,  Will refer him to [facility] vestibular functional test,  Empirically treat with nortriptyline, 25 mg titrating to 50 mg every night as preventive medications,  Maxalt as needed  EEG  Marcial Pacas, M.D. Ph.D.  [facility] [address], [place], [place] [address] Ph: [phone] Fax: [phone]  CC: Janith Lima, MD

## 2018-01-14 ENCOUNTER — Ambulatory Visit: Payer: BLUE CROSS/BLUE SHIELD | Admitting: Neurology

## 2018-01-14 DIAGNOSIS — R2990 Unspecified symptoms and signs involving the nervous system: Secondary | ICD-10-CM | POA: Diagnosis not present

## 2018-01-14 DIAGNOSIS — IMO0002 Reserved for concepts with insufficient information to code with codable children: Secondary | ICD-10-CM

## 2018-01-14 DIAGNOSIS — R42 Dizziness and giddiness: Secondary | ICD-10-CM

## 2018-01-14 DIAGNOSIS — G43709 Chronic migraine without aura, not intractable, without status migrainosus: Secondary | ICD-10-CM

## 2018-01-17 NOTE — Procedures (Signed)
   HISTORY: 52 years old male with sudden onset headache and dizziness. TECHNIQUE:  16 channel EEG was performed based on standard 10-16 international system. One channel was dedicated to EKG, which has demonstrates normal sinus rhythm of 72 beats per minutes.  Upon awakening, the posterior background activity was well-developed, in alpha range, 10 Hz, reactive to eye opening and closure.  There was no evidence of epileptiform discharge.  Photic stimulation was performed, which induced a symmetric photic driving.  Hyperventilation was performed, there was no abnormality elicit.  No sleep was achieved.  CONCLUSION: This is a  normal awake EEG.  There is no electrodiagnostic evidence of epileptiform discharge.  Marcial Pacas, M.D. Ph.D.  [facility] [place], [place] [address] Phone: [phone] Fax:      [phone]

## 2018-01-20 ENCOUNTER — Telehealth: Payer: Self-pay | Admitting: *Deleted

## 2018-01-20 NOTE — Telephone Encounter (Signed)
Patient no- showed his apt at [facility] . Per [facility] Patient needs to call back to schedule. I will call Patient with telephone number so he can call them back .  [phone]

## 2018-01-20 NOTE — Telephone Encounter (Signed)
-----   Message from Marcial Pacas, MD sent at 01/20/2018  9:59 AM EDT ----- Please call patient for normal EEG

## 2018-01-20 NOTE — Telephone Encounter (Signed)
Spoke to patient - he is aware of EEG results.  He would like to check on his eferral to [facility].

## 2018-01-22 NOTE — Telephone Encounter (Signed)
Pt called said he has not heard anything from [facility] and is wanting the phone number to call. I told him the information below, pt said he never got a call from [facility]. I gave him the phone number, he will call. Pt was appreciative  FYI

## 2018-01-22 NOTE — Telephone Encounter (Signed)
Pt unable to Call [facility], number [phone] has been disconnected. Pt needing an updated #

## 2018-01-22 NOTE — Telephone Encounter (Signed)
[phone] [id number] Called and spoke to patient and gave him the correct number and apologized for the inconvenience . Patient understood

## 2018-01-28 DIAGNOSIS — R42 Dizziness and giddiness: Secondary | ICD-10-CM | POA: Diagnosis not present

## 2018-01-28 DIAGNOSIS — R51 Headache: Secondary | ICD-10-CM | POA: Diagnosis not present

## 2018-01-29 ENCOUNTER — Telehealth: Payer: Self-pay | Admitting: Neurology

## 2018-01-29 NOTE — Telephone Encounter (Signed)
Spoke to patient - he was seen at [facility] on 01/28/18 and was instructed to call here to discuss his medications.  He has continued taking nortriptyline 50mg  at bedtime for migraines.  He is still having quite a bit of dizziness.  He is unable to take meclizine while working due to intolerable drowsiness.  He is requesting a medication to help with this symptom.

## 2018-01-29 NOTE — Telephone Encounter (Signed)
Pt has called stating he was told by ENT Dr yesterday to call back to Dr Rhea Belton office for medication for his vertigo.  Pt uses  [facility] [address] [phone] (Phone) [phone] (Fax)

## 2018-01-30 ENCOUNTER — Ambulatory Visit: Payer: BLUE CROSS/BLUE SHIELD | Admitting: Nurse Practitioner

## 2018-01-30 ENCOUNTER — Ambulatory Visit (INDEPENDENT_AMBULATORY_CARE_PROVIDER_SITE_OTHER): Payer: BLUE CROSS/BLUE SHIELD | Admitting: Neurology

## 2018-01-30 ENCOUNTER — Encounter: Payer: Self-pay | Admitting: Nurse Practitioner

## 2018-01-30 VITALS — BP 136/89 | HR 82 | Ht 74.0 in | Wt 179.2 lb

## 2018-01-30 DIAGNOSIS — R42 Dizziness and giddiness: Secondary | ICD-10-CM

## 2018-01-30 DIAGNOSIS — G43709 Chronic migraine without aura, not intractable, without status migrainosus: Secondary | ICD-10-CM

## 2018-01-30 DIAGNOSIS — IMO0002 Reserved for concepts with insufficient information to code with codable children: Secondary | ICD-10-CM

## 2018-01-30 MED ORDER — RIZATRIPTAN BENZOATE 10 MG PO TBDP: 10.0000 mg | ORAL_TABLET | ORAL | 11 refills | Status: DC | PRN

## 2018-01-30 MED ORDER — NORTRIPTYLINE HCL 50 MG PO CAPS: 100.0000 mg | ORAL_CAPSULE | Freq: Every day | ORAL | 11 refills | Status: DC

## 2018-01-30 MED ORDER — TOPIRAMATE 100 MG PO TABS: 100.0000 mg | ORAL_TABLET | Freq: Two times a day (BID) | ORAL | 11 refills | Status: DC

## 2018-01-30 NOTE — Telephone Encounter (Signed)
Spoke to the patient again this morning.  He is anxious to be seen.  He has been working but not feeling well while there.  He has been added to Carolyn's schedule today in an available slot.  He will arrive at 10:45am for his 11:15am appt.

## 2018-01-30 NOTE — Progress Notes (Signed)
[facility]  PATIENT: Kenneth Gilbert DOB: 1966/02/07   HISTORY OF PRESENT ILLNESS: Kenneth Gilbert is a 52 year old male, seen in request by his primary care physician Dr. Ronnald Ramp, [place] Right for evaluation of dizziness, initial evaluation was on January 09, 2018,  He reported gradual onset of dizziness since 2017, he describes his dizziness as woozy headed, it can happen in sitting down, standing up position, but he is a poor historian, it is hard to elaborate on the detail of his symptoms, he did have a history of bilateral otitis media, with hearing loss, worse on the left side, wearing left side hearing aid/amplifier from [place],  In the background of frequent lightheadedness, he also reported spells of sudden onset spinning sensation, he presented to the emergency room on December 19, 2017, 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  On further questioning, patient reported frequent headaches, pounding, severe, with associated dizziness, sometimes nausea, also complains of difficulty sleeping, sometimes during intense vertigo and headaches, he had transient loss of consciousness for a few minutes,  MRI of the brain without contrast in July 2019 was normal, chronic right mid ear and mastoid inflammatory changes,  CTA head and neck: no significant stenosis.  Lab in August 2019, creat 0.8, Hg 16.3, UDS is positive for marijuana, CMP showed mild elevated glucose 108, cbc, alcohol level less than 10  UPDATE Sept 19 2019: He was seen by [facility] Dr. Rachell Cipro on Sept 17 2019: No evidence of significant peripheral or central vestibular dysfunction, no obvious of ophthalmologic  abnormality would explain his recurrent episode of dizziness with associated headache,  He started taking nortriptyline 50 mg every night tolerating it well, but did not notice significant improvement, continue complains of foggy sensation, difficulty sleeping, woke up multiple times, irregular sleep pattern, sometimes catch a cat nap on his couch, complains of anxiety, there was also intermittent episode of sudden onset intense vertigo with associated headache, it can last couple hours, this has put significant limitation on his performance, he has limited income, 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  REVIEW OF SYSTEMS: Full 14 system review of systems performed and notable only for those listed, all others are neg:  As above   ALLERGIES: Allergies  Allergen Reactions  . Codeine     REACTION: Dizzy    HOME MEDICATIONS: Outpatient Medications Prior to Visit  Medication Sig Dispense Refill  . meclizine (ANTIVERT) 12.5 MG tablet Take 1 tablet (12.5 mg total) by mouth 3 (three) times daily as needed for dizziness. 30 tablet 1  . nortriptyline (PAMELOR) 25 MG capsule Take 2 capsules (50 mg total) by mouth at bedtime. 60 capsule 11  . rizatriptan (MAXALT-MLT) 5 MG disintegrating tablet Take 1 tablet (5 mg total) by mouth as needed. May repeat in 2 hours if needed 12 tablet 6  . rosuvastatin (CRESTOR) 5 MG tablet Take 5 mg by mouth daily.     No facility-administered medications prior to visit.     PAST MEDICAL HISTORY: Past Medical History:  Diagnosis Date  . Arthritis   . Dizziness   . ETOH abuse   . Hard of hearing   . Headache   . Hyperlipidemia  PAST SURGICAL HISTORY: Past Surgical History:  Procedure Laterality Date  . arm surgery Right   . arm surgery Left   . INNER EAR SURGERY     as a child  . SHOULDER SURGERY Left     FAMILY HISTORY: Family History  Problem Relation Age of Onset  . Cancer Father        unknown type  .  Cirrhosis Sister   . Heart disease Mother   . Cancer Brother     SOCIAL HISTORY: Social History   Socioeconomic History  . Marital status: Divorced    Spouse name: Not on file  . Number of children: 4  . Years of education: 27  . Highest education level: High school graduate  Occupational History  . Occupation: Sales executive, oil changes, rotates tires  Social Needs  . Financial resource strain: Not on file  . Food insecurity:    Worry: Not on file    Inability: Not on file  . Transportation needs:    Medical: Not on file    Non-medical: Not on file  Tobacco Use  . Smoking status: Current Every Day Smoker    Packs/day: 1.00    Types: Cigarettes    Start date: 06/11/1978  . Smokeless tobacco: Never Used  Substance and Sexual Activity  . Alcohol use: No    Frequency: Never    Comment: 01/09/18 - reports no use in 3 years  . Drug use: Yes    Types: Marijuana    Comment: 01/09/18 - uses marijuana occasionally  . Sexual activity: Yes    Partners: Female  Lifestyle  . Physical activity:    Days per week: Not on file    Minutes per session: Not on file  . Stress: Not on file  Relationships  . Social connections:    Talks on phone: Not on file    Gets together: Not on file    Attends religious service: Not on file    Active member of club or organization: Not on file    Attends meetings of clubs or organizations: Not on file    Relationship status: Not on file  . Intimate partner violence:    Fear of current or ex partner: Not on file    Emotionally abused: Not on file    Physically abused: Not on file    Forced sexual activity: Not on file  Other Topics Concern  . Not on file  Social History Narrative   Lives at home with his son.   Caffeine use:  Drinks 3-4 cans of [place] tea (20oz cans)   Right-handed.     PHYSICAL EXAM  There were no vitals filed for this visit. There is no height or weight on file to calculate BMI.  Generalized: Well developed, in  no acute distress  Head: normocephalic and atraumatic,. Oropharynx benign  Neck: Supple, no carotid bruits  Cardiac: Regular rate rhythm, no murmur  Musculoskeletal: No deformity   Neurological examination   Mentation: Alert oriented to time, place, history taking. Attention span and concentration appropriate. Recent and remote memory intact.  Follows all commands speech and language fluent.   Cranial nerve II-XII: Fundoscopic exam reveals sharp disc margins.Pupils were equal round reactive to light extraocular movements were full, visual field were full on confrontational test. Facial sensation and strength were normal. hearing was intact to finger rubbing bilaterally. Uvula tongue midline. head turning and shoulder shrug were normal and symmetric.Tongue protrusion into cheek strength was normal. Motor: normal  bulk and tone, full strength in the BUE, BLE, fine finger movements normal, no pronator drift. No focal weakness Sensory: normal and symmetric to light touch, pinprick, and  Vibration, proprioception  Coordination: finger-nose-finger, heel-to-shin bilaterally, no dysmetria Reflexes: Brachioradialis 2/2, biceps 2/2, triceps 2/2, patellar 2/2, Achilles 2/2, plantar responses were flexor bilaterally. Gait and Station: Rising up from seated position without assistance, normal stance,  moderate stride, good arm swing, smooth turning, able to perform tiptoe, and heel walking without difficulty. Tandem gait is steady  DIAGNOSTIC DATA (LABS, IMAGING, TESTING) - I reviewed patient records, labs, notes, testing and imaging myself where available.  Lab Results  Component Value Date   WBC 6.9 12/19/2017   HGB 16.3 12/19/2017   HCT 48.0 12/19/2017   MCV 95.3 12/19/2017   PLT 148 (L) 12/19/2017      Component Value Date/Time   NA 142 12/19/2017 1033   K 3.9 12/19/2017 1033   CL 107 12/19/2017 1033   CO2 19 (L) 12/19/2017 1016   GLUCOSE 103 (H) 12/19/2017 1033   BUN 9 12/19/2017 1033    CREATININE 0.80 12/19/2017 1033   CALCIUM 9.4 12/19/2017 1016   PROT 7.0 12/19/2017 1016   ALBUMIN 4.2 12/19/2017 1016   AST 25 12/19/2017 1016   ALT 17 12/19/2017 1016   ALKPHOS 73 12/19/2017 1016   BILITOT 0.8 12/19/2017 1016   GFRNONAA >60 12/19/2017 1016   GFRAA >60 12/19/2017 1016   Lab Results  Component Value Date   CHOL 177 06/11/2017   HDL 33.00 (L) 06/11/2017   LDLCALC 125 (H) 06/11/2017   TRIG 94.0 06/11/2017   CHOLHDL 5 06/11/2017   No results found for: HGBA1C No results found for: VITAMINB12 Lab Results  Component Value Date   TSH 0.874 Test methodology is 3rd generation TSH 07/01/2009      ASSESSMENT AND PLAN  52 y.o. year old male  Dizziness Chronic headache with migraine features   Likely multifactorial, chronic migraine with underlying anxiety disorder  We will try a higher dose of nortriptyline titrating to 100 mg every night  Maxalt 10 mg as needed  Add on Topamax 100 mg twice a day, potential side effects explained,     Marcial Pacas, M.D. Ph.D.  [facility] [place], [place] [address] Phone: [phone] Fax:      [phone]

## 2018-01-30 NOTE — Progress Notes (Deleted)
[facility]  PATIENT: Kenneth Gilbert DOB: 1965/06/24   REASON FOR VISIT: Follow-up for dizziness, migraine headache HISTORY FROM: Patient    HISTORY OF PRESENT ILLNESS: Kenneth Gilbert is a 52 year old male, seen in request by his primary care physician Dr. Janith Lima for evaluation of dizziness, initial evaluation was on January 09, 2018,  He reported gradual onset of dizziness since 2017, he describes his dizziness as woozy headed, it can happen in sitting down, standing up position, but he is a poor historian, it is hard to elaborate on the detail of his symptoms, he did have a history of bilateral otitis media, with hearing loss, worse on the left side, wearing left side hearing aid/amplifier from [place],  In the background of frequent lightheadedness, he also reported spells of sudden onset spinning sensation, he presented to the emergency room on December 19, 2017, 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  On further questioning, patient reported frequent headaches, pounding, severe, with associated dizziness, sometimes nausea, also complains of difficulty sleeping, sometimes during intense vertigo and headaches, he had transient loss of consciousness for a few minutes,  MRI of the brain without contrast in July 2019 was normal, chronic right mid ear and mastoid inflammatory changes,  CTA head and neck: no significant stenosis.  Lab in August 2019, creat 0.8, Hg 16.3, UDS is positive for marijuana, CMP showed mild elevated glucose 108, cbc, alcohol level less than 10   REVIEW OF SYSTEMS: Full 14 system review of systems performed and notable only for those listed, all  others are neg:  Constitutional: neg  Cardiovascular: neg Ear/Nose/Throat: neg  Skin: neg Eyes: neg Respiratory: neg Gastroitestinal: neg  Hematology/Lymphatic: neg  Endocrine: neg Musculoskeletal:neg Allergy/Immunology: neg Neurological: neg Psychiatric: neg Sleep : neg   ALLERGIES: Allergies  Allergen Reactions  . Codeine     REACTION: Dizzy    HOME MEDICATIONS: Outpatient Medications Prior to Visit  Medication Sig Dispense Refill  . meclizine (ANTIVERT) 12.5 MG tablet Take 1 tablet (12.5 mg total) by mouth 3 (three) times daily as needed for dizziness. 30 tablet 1  . nortriptyline (PAMELOR) 25 MG capsule Take 2 capsules (50 mg total) by mouth at bedtime. 60 capsule 11  . rizatriptan (MAXALT-MLT) 5 MG disintegrating tablet Take 1 tablet (5 mg total) by mouth as needed. May repeat in 2 hours if needed 12 tablet 6  . rosuvastatin (CRESTOR) 5 MG tablet Take 5 mg by mouth daily.     No facility-administered medications prior to visit.     PAST MEDICAL HISTORY: Past Medical History:  Diagnosis Date  . Arthritis   . Dizziness   . ETOH abuse   . Hard of hearing   . Hyperlipidemia     PAST SURGICAL HISTORY: Past Surgical History:  Procedure Laterality Date  . arm surgery Right   . arm surgery Left   . INNER EAR SURGERY     as a child  . SHOULDER SURGERY Left     FAMILY HISTORY: Family History  Problem Relation Age of Onset  . Cancer Father        unknown type  . Cirrhosis Sister   . Heart disease Brother   . Heart disease Mother  SOCIAL HISTORY: Social History   Socioeconomic History  . Marital status: Divorced    Spouse name: Not on file  . Number of children: 4  . Years of education: 20  . Highest education level: High school graduate  Occupational History  . Occupation: Sales executive, oil changes, rotates tires  Social Needs  . Financial resource strain: Not on file  . Food insecurity:    Worry: Not on file    Inability: Not on file    . Transportation needs:    Medical: Not on file    Non-medical: Not on file  Tobacco Use  . Smoking status: Current Every Day Smoker    Packs/day: 1.00    Types: Cigarettes    Start date: 06/11/1978  . Smokeless tobacco: Never Used  Substance and Sexual Activity  . Alcohol use: No    Frequency: Never    Comment: 01/09/18 - reports no use in 3 years  . Drug use: Yes    Types: Marijuana    Comment: 01/09/18 - uses marijuana occasionally  . Sexual activity: Yes    Partners: Female  Lifestyle  . Physical activity:    Days per week: Not on file    Minutes per session: Not on file  . Stress: Not on file  Relationships  . Social connections:    Talks on phone: Not on file    Gets together: Not on file    Attends religious service: Not on file    Active member of club or organization: Not on file    Attends meetings of clubs or organizations: Not on file    Relationship status: Not on file  . Intimate partner violence:    Fear of current or ex partner: Not on file    Emotionally abused: Not on file    Physically abused: Not on file    Forced sexual activity: Not on file  Other Topics Concern  . Not on file  Social History Narrative   Lives at home with his son.   Caffeine use:  Drinks 3-4 cans of [place] tea (20oz cans)   Right-handed.     PHYSICAL EXAM  There were no vitals filed for this visit. There is no height or weight on file to calculate BMI.  Generalized: Well developed, in no acute distress  Head: normocephalic and atraumatic,. Oropharynx benign  Neck: Supple, no carotid bruits  Cardiac: Regular rate rhythm, no murmur  Musculoskeletal: No deformity   Neurological examination   Mentation: Alert oriented to time, place, history taking. Attention span and concentration appropriate. Recent and remote memory intact.  Follows all commands speech and language fluent.   Cranial nerve II-XII: Fundoscopic exam reveals sharp disc margins.Pupils were equal round  reactive to light extraocular movements were full, visual field were full on confrontational test. Facial sensation and strength were normal. hearing was intact to finger rubbing bilaterally. Uvula tongue midline. head turning and shoulder shrug were normal and symmetric.Tongue protrusion into cheek strength was normal. Motor: normal bulk and tone, full strength in the BUE, BLE, fine finger movements normal, no pronator drift. No focal weakness Sensory: normal and symmetric to light touch, pinprick, and  Vibration, proprioception  Coordination: finger-nose-finger, heel-to-shin bilaterally, no dysmetria Reflexes: Brachioradialis 2/2, biceps 2/2, triceps 2/2, patellar 2/2, Achilles 2/2, plantar responses were flexor bilaterally. Gait and Station: Rising up from seated position without assistance, normal stance,  moderate stride, good arm swing, smooth turning, able to perform tiptoe, and heel walking without difficulty. Tandem  gait is steady  DIAGNOSTIC DATA (LABS, IMAGING, TESTING) - I reviewed patient records, labs, notes, testing and imaging myself where available.  Lab Results  Component Value Date   WBC 6.9 12/19/2017   HGB 16.3 12/19/2017   HCT 48.0 12/19/2017   MCV 95.3 12/19/2017   PLT 148 (L) 12/19/2017      Component Value Date/Time   NA 142 12/19/2017 1033   K 3.9 12/19/2017 1033   CL 107 12/19/2017 1033   CO2 19 (L) 12/19/2017 1016   GLUCOSE 103 (H) 12/19/2017 1033   BUN 9 12/19/2017 1033   CREATININE 0.80 12/19/2017 1033   CALCIUM 9.4 12/19/2017 1016   PROT 7.0 12/19/2017 1016   ALBUMIN 4.2 12/19/2017 1016   AST 25 12/19/2017 1016   ALT 17 12/19/2017 1016   ALKPHOS 73 12/19/2017 1016   BILITOT 0.8 12/19/2017 1016   GFRNONAA >60 12/19/2017 1016   GFRAA >60 12/19/2017 1016   Lab Results  Component Value Date   CHOL 177 06/11/2017   HDL 33.00 (L) 06/11/2017   LDLCALC 125 (H) 06/11/2017   TRIG 94.0 06/11/2017   CHOLHDL 5 06/11/2017   No results found for:  HGBA1C No results found for: VITAMINB12 Lab Results  Component Value Date   TSH 0.874 ***Test methodology is 3rd generation TSH*** 07/01/2009      ASSESSMENT AND PLAN  52 y.o. year old male  has a past medical history of Arthritis, Dizziness, ETOH abuse, Hard of hearing, and Hyperlipidemia. here with ***  Kenneth Gilbert is a 52 y.o. male   Dizziness Chronic headache with migraine features Passing out spells             He is a poor historian, complains of daily lightheaded sensation, with superimposed vertigo, headaches,  also had a history of chronic otitis media, tympanic perforation, decreased hearing bilaterally left worse than right             differentiation diagnosis including migraine headaches, vestibular hypofunction, deconditioning, anxiety,             Will refer him to [facility] vestibular functional test,             Empirically treat with nortriptyline, 25 mg titrating to 50 mg every night as preventive medications,             Maxalt as needed             EEG  Dennie Bible, [facility], [facility], [facility] [address], [place], [place] [address] [phone]

## 2018-01-30 NOTE — Telephone Encounter (Signed)
Give him an appt with NP to discuss his symptoms and medication

## 2018-01-31 ENCOUNTER — Encounter: Payer: Self-pay | Admitting: Neurology

## 2018-01-31 NOTE — Progress Notes (Signed)
Pt seen by Dr Krista Blue

## 2018-02-03 ENCOUNTER — Telehealth: Payer: Self-pay | Admitting: Neurology

## 2018-02-03 NOTE — Telephone Encounter (Signed)
He started taking topiramate 100mg , BID on 01/31/18.  He began experiencing significant dizziness, nausea and vomiting.  He decided to hold off on the medication until he spoke to our office.  He was instructed to restart the medication and titrate the dose up slowly, as tolerated.  He was given the following schedule:  topiramate 50mg  at bedtime x 1 week, then increase to 100mg  at bedtime x 1 week, then 50mg  in am and 100mg  at bedtime x 1 week, then 100mg  BID thereafter.  He will attempt to get on the medication slowly.  If his symptoms are still intolerable, he will call us back.

## 2018-02-03 NOTE — Telephone Encounter (Signed)
Pt requesting a call stating that medication topiramate (TOPAMAX) 100 MG tablet makes his mouth dry and very dizzy, requesting a call to discuss

## 2018-02-13 DIAGNOSIS — Z049 Encounter for examination and observation for unspecified reason: Secondary | ICD-10-CM | POA: Diagnosis not present

## 2018-02-13 DIAGNOSIS — G43709 Chronic migraine without aura, not intractable, without status migrainosus: Secondary | ICD-10-CM | POA: Diagnosis not present

## 2018-02-17 DIAGNOSIS — G518 Other disorders of facial nerve: Secondary | ICD-10-CM | POA: Diagnosis not present

## 2018-02-17 DIAGNOSIS — G43709 Chronic migraine without aura, not intractable, without status migrainosus: Secondary | ICD-10-CM | POA: Diagnosis not present

## 2018-02-17 DIAGNOSIS — M791 Myalgia, unspecified site: Secondary | ICD-10-CM | POA: Diagnosis not present

## 2018-02-17 DIAGNOSIS — M542 Cervicalgia: Secondary | ICD-10-CM | POA: Diagnosis not present

## 2018-03-03 DIAGNOSIS — M791 Myalgia, unspecified site: Secondary | ICD-10-CM | POA: Diagnosis not present

## 2018-03-03 DIAGNOSIS — G43709 Chronic migraine without aura, not intractable, without status migrainosus: Secondary | ICD-10-CM | POA: Diagnosis not present

## 2018-03-03 DIAGNOSIS — M542 Cervicalgia: Secondary | ICD-10-CM | POA: Diagnosis not present

## 2018-03-03 DIAGNOSIS — G518 Other disorders of facial nerve: Secondary | ICD-10-CM | POA: Diagnosis not present

## 2018-03-05 ENCOUNTER — Telehealth: Payer: Self-pay | Admitting: Neurology

## 2018-03-05 NOTE — Telephone Encounter (Signed)
Pt requesting a call stating that medication nortriptyline (PAMELOR) 50 MG capsule is keeping him up at night and once he falls asleep he has "crazy weird" dreams. Please advise

## 2018-03-05 NOTE — Telephone Encounter (Signed)
Spoke to patient - he is going to cut his nortriptyline dose back down to 50mg  at bedtime to see if this will resolve his symptoms.  He stopped smoking marijuana and cigarettes and feels this has played a role in having less dizzy spells.  Also, he intends to restart his topiramate, slowly titrating his dose as previously discussed with him on 02/03/18:  He was instructed to restart the medication and titrate the dose up slowly, as tolerated.  He was given the following schedule:  topiramate 50mg  at bedtime x 1 week, then increase to 100mg  at bedtime x 1 week, then 50mg  in am and 100mg  at bedtime x 1 week, then 100mg  BID thereafter.  He will attempt to get on the medication slowly.  He will call us back with any concerns or questions.

## 2018-03-11 NOTE — Telephone Encounter (Signed)
Spoke to patient - when he cut his nortriptyline down to 50mg  at bedtime, he started having dizzy spells again.  Previously, when he was taking 100mg  at bedtime, he would have "crazy dreams" but his dizziness was no longer a problem.  He is going to start taking the 100mg  at bedtime again.  He will keep [name] closely updated if the side effects are intolerable.

## 2018-03-11 NOTE — Telephone Encounter (Signed)
Pt requesting a call stating he would like to go back on 2 tablets of nortriptyline (PAMELOR) 50 MG capsule. Stating with only 50MG  he feels "very dizzy and out of it".

## 2018-03-18 NOTE — Telephone Encounter (Signed)
Pt has called to inform that since being on this medication he hasn't been able to use the bathroom.  Please call

## 2018-03-18 NOTE — Telephone Encounter (Signed)
Spoke to patient - he is currently experiencing some constipation.  Encouraged him to increase his water intake, increase fiber and try OTC mild stool softener.  He is agreeable to try these suggestions and will call us back if it continues to be a problem.

## 2018-04-23 ENCOUNTER — Ambulatory Visit: Payer: BLUE CROSS/BLUE SHIELD | Admitting: Neurology

## 2018-04-23 ENCOUNTER — Encounter: Payer: Self-pay | Admitting: Neurology

## 2018-04-23 VITALS — BP 111/68 | HR 87 | Ht 74.0 in | Wt 181.5 lb

## 2018-04-23 DIAGNOSIS — IMO0002 Reserved for concepts with insufficient information to code with codable children: Secondary | ICD-10-CM

## 2018-04-23 DIAGNOSIS — R42 Dizziness and giddiness: Secondary | ICD-10-CM

## 2018-04-23 DIAGNOSIS — G43709 Chronic migraine without aura, not intractable, without status migrainosus: Secondary | ICD-10-CM | POA: Diagnosis not present

## 2018-04-23 NOTE — Progress Notes (Signed)
[facility]  PATIENT: Kenneth Gilbert DOB: 08/12/1965   HISTORY OF PRESENT ILLNESS: Kenneth Gilbert is a 52 year old male, seen in request by his primary care physician Dr. Ronnald Ramp, [facility] for evaluation of dizziness, initial evaluation was on January 09, 2018,  He reported gradual onset of dizziness since 2017, he describes his dizziness as woozy headed, it can happen in sitting down, standing up position, but he is a poor historian, it is hard to elaborate on the detail of his symptoms, he did have a history of bilateral otitis media, with hearing loss, worse on the left side, wearing left side hearing aid/amplifier from [place],  In the background of frequent lightheadedness, he also reported spells of sudden onset spinning sensation, he presented to the emergency room on December 19, 2017, 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  On further questioning, patient reported frequent headaches, pounding, severe, with associated dizziness, sometimes nausea, also complains of difficulty sleeping, sometimes during intense vertigo and headaches, he had transient loss of consciousness for a few minutes,  MRI of the brain without contrast in July 2019 was normal, chronic right mid ear and mastoid inflammatory changes,  CTA head and neck: no significant stenosis.  Lab in August 2019, creat 0.8, Hg 16.3, UDS is positive for marijuana, CMP showed mild elevated glucose 108, cbc, alcohol level less than 10  UPDATE Sept 19 2019: He was seen by [facility] Dr. Rachell Cipro on Sept 17 2019: No evidence of significant peripheral or central vestibular dysfunction, no obvious of ophthalmologic  abnormality would explain his recurrent episode of dizziness with associated headache,  He started taking nortriptyline 50 mg every night tolerating it well, but did not notice significant improvement, continue complains of foggy sensation, difficulty sleeping, woke up multiple times, irregular sleep pattern, sometimes catch a cat nap on his couch, complains of anxiety, there was also intermittent episode of sudden onset intense vertigo with associated headache, it can last couple hours, this has put significant limitation on his performance, he has limited income, 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  UPDATE Apr 23 2018: He is tolerating Topamax 100 mg twice a day well, also nortriptyline 50 mg 2 tablets every night, He rarely has significant vertigo, but with sudden positional change, he still felt lightheaded, also complains of dryness, constipation, frequent dreams,  REVIEW OF SYSTEMS: Full 14 system review of systems performed and notable only for those listed, Dizziness, numbness, joint pain, swelling  all others are neg:  As above   ALLERGIES: Allergies  Allergen Reactions  . Codeine     REACTION: Dizzy    HOME MEDICATIONS: Outpatient Medications Prior to Visit  Medication Sig Dispense Refill  . nortriptyline (PAMELOR) 50 MG capsule Take 2 capsules (100 mg total) by mouth at bedtime. 60 capsule 11  . rizatriptan (MAXALT-MLT) 10 MG disintegrating tablet Take 1 tablet (10 mg total) by mouth as needed. May repeat in 2 hours if needed 12 tablet 11  . rosuvastatin (CRESTOR) 5 MG tablet Take 5 mg by mouth daily.    [name] topiramate (TOPAMAX) 100 MG tablet Take 1 tablet (100 mg total) by mouth 2 (two) times daily. 60 tablet 11  .  meclizine (ANTIVERT) 12.5 MG tablet Take 1 tablet (12.5 mg total) by mouth 3 (three) times daily as needed for dizziness. (Patient not taking: Reported on 04/23/2018) 30 tablet 1   No facility-administered medications prior to  visit.     PAST MEDICAL HISTORY: Past Medical History:  Diagnosis Date  . Arthritis   . Dizziness   . ETOH abuse   . Hard of hearing   . Headache   . Hyperlipidemia     PAST SURGICAL HISTORY: Past Surgical History:  Procedure Laterality Date  . arm surgery Right   . arm surgery Left   . INNER EAR SURGERY     as a child  . SHOULDER SURGERY Left     FAMILY HISTORY: Family History  Problem Relation Age of Onset  . Cancer Father        unknown type  . Cirrhosis Sister   . Heart disease Mother   . Cancer Brother     SOCIAL HISTORY: Social History   Socioeconomic History  . Marital status: Divorced    Spouse name: Not on file  . Number of children: 4  . Years of education: 58  . Highest education level: High school graduate  Occupational History  . Occupation: Sales executive, oil changes, rotates tires  Social Needs  . Financial resource strain: Not on file  . Food insecurity:    Worry: Not on file    Inability: Not on file  . Transportation needs:    Medical: Not on file    Non-medical: Not on file  Tobacco Use  . Smoking status: Current Every Day Smoker    Packs/day: 1.00    Types: Cigarettes    Start date: 06/11/1978  . Smokeless tobacco: Never Used  Substance and Sexual Activity  . Alcohol use: No    Frequency: Never    Comment: 01/09/18 - reports no use in 3 years  . Drug use: Yes    Types: Marijuana    Comment: 01/09/18 - uses marijuana occasionally  . Sexual activity: Yes    Partners: Female  Lifestyle  . Physical activity:    Days per week: Not on file    Minutes per session: Not on file  . Stress: Not on file  Relationships  . Social connections:    Talks on phone: Not on file    Gets together: Not on file    Attends religious service: Not on file    Active member of club or organization: Not on file    Attends meetings of clubs or organizations: Not on file    Relationship status: Not on file  . Intimate partner violence:    Fear of  current or ex partner: Not on file    Emotionally abused: Not on file    Physically abused: Not on file    Forced sexual activity: Not on file  Other Topics Concern  . Not on file  Social History Narrative   Lives at home with his son.   Caffeine use:  Drinks 3-4 cans of [place] tea (20oz cans)   Right-handed.     PHYSICAL EXAM  Vitals:   04/23/18 0922  BP: 111/68  Pulse: 87  Weight: 181 lb 8 oz (82.3 kg)  Height: 6\' 2"  (1.88 m)   Body mass index is 23.3 kg/m.  Generalized: Well developed, in no acute distress  Head: normocephalic and atraumatic,. Oropharynx benign  Neck: Supple, no carotid bruits  Cardiac: Regular rate rhythm, no murmur  Musculoskeletal:  No deformity   Neurological examination   Mentation: Alert oriented to time, place, history taking. Attention span and concentration appropriate. Recent and remote memory intact.  Follows all commands speech and language fluent.   Cranial nerve II-XII: .Pupils were equal round reactive to light extraocular movements were full, visual field were full on confrontational test. Facial sensation and strength were normal. hearing was intact to finger rubbing bilaterally. Uvula tongue midline. head turning and shoulder shrug were normal and symmetric.Tongue protrusion into cheek strength was normal. Motor: normal bulk and tone, full strength in the BUE, BLE, fine finger movements normal, no pronator drift. No focal weakness Sensory: normal and symmetric to light touch, pinprick, and  Vibration, proprioception  Coordination: finger-nose-finger, heel-to-shin bilaterally, no dysmetria Reflexes: Brachioradialis 2/2, biceps 2/2, triceps 2/2, patellar 2/2, Achilles 2/2, plantar responses were flexor bilaterally. Gait and Station: Rising up from seated position without assistance, normal stance,  moderate stride, good arm swing, smooth turning, able to perform tiptoe, and heel walking without difficulty. Tandem gait is steady  DIAGNOSTIC  DATA (LABS, IMAGING, TESTING) - I reviewed patient records, labs, notes, testing and imaging myself where available.  Lab Results  Component Value Date   WBC 6.9 12/19/2017   HGB 16.3 12/19/2017   HCT 48.0 12/19/2017   MCV 95.3 12/19/2017   PLT 148 (L) 12/19/2017      Component Value Date/Time   NA 142 12/19/2017 1033   K 3.9 12/19/2017 1033   CL 107 12/19/2017 1033   CO2 19 (L) 12/19/2017 1016   GLUCOSE 103 (H) 12/19/2017 1033   BUN 9 12/19/2017 1033   CREATININE 0.80 12/19/2017 1033   CALCIUM 9.4 12/19/2017 1016   PROT 7.0 12/19/2017 1016   ALBUMIN 4.2 12/19/2017 1016   AST 25 12/19/2017 1016   ALT 17 12/19/2017 1016   ALKPHOS 73 12/19/2017 1016   BILITOT 0.8 12/19/2017 1016   GFRNONAA >60 12/19/2017 1016   GFRAA >60 12/19/2017 1016   Lab Results  Component Value Date   CHOL 177 06/11/2017   HDL 33.00 (L) 06/11/2017   LDLCALC 125 (H) 06/11/2017   TRIG 94.0 06/11/2017   CHOLHDL 5 06/11/2017   No results found for: HGBA1C No results found for: VITAMINB12 Lab Results  Component Value Date   TSH 0.874 Test methodology is 3rd generation TSH 07/01/2009      ASSESSMENT AND PLAN  52 y.o. year old male  Dizziness Chronic headache with migraine features   Likely multifactorial, chronic migraine with underlying anxiety disorder  Continue Topamax 100 mg twice a day  May decrease nortriptyline to 50 mg every day  Maxalt as needed  Marcial Pacas, M.D. Ph.D.  [facility] [place], [place] [address] Phone: [phone] Fax:      [phone]

## 2018-05-15 ENCOUNTER — Telehealth: Payer: Self-pay | Admitting: Neurology

## 2018-05-15 NOTE — Telephone Encounter (Signed)
Pt is asking for a call from RN to discuss his nortriptyline (PAMELOR) 50 MG capsule

## 2018-05-15 NOTE — Telephone Encounter (Signed)
Left message requesting a return call.

## 2018-05-15 NOTE — Telephone Encounter (Signed)
Pt returning RN's call.

## 2018-05-15 NOTE — Telephone Encounter (Signed)
Spoke to patient - states he received injections from another physician and now he has become hypersensitive to smells.  He wanted to review the side effects of the "migraine shots".  He was unable to tell me the name of the physician or the type of medication used in the injection.  I was unable to find this information under Care Everywhere.  He was instructed to contact the doctor's office where he received the injections.  Additionally, he was informed that he should only be seeing one physician for migraine management.  Stated he prefers to stay with Kenneth Gilbert.  He is going to ask the other office to fax over the type of injections he had recently.

## 2018-05-19 NOTE — Telephone Encounter (Signed)
Patient called back requesting a referral to ENT due to new onset of hypersensitivity to smells.  Dr. Krista Blue has never evaluated this problem.  He feels this came on after having shots at another office (unsure of physician's name or type of medication he recieved).  He is going to reach out to them or his PCP for the referral.

## 2018-05-19 NOTE — Telephone Encounter (Signed)
Patient called and would like to get a referral for an ENT sent over. please call and advise.

## 2018-05-22 NOTE — Telephone Encounter (Signed)
Error

## 2018-06-11 DIAGNOSIS — J329 Chronic sinusitis, unspecified: Secondary | ICD-10-CM | POA: Diagnosis not present

## 2018-06-11 DIAGNOSIS — J343 Hypertrophy of nasal turbinates: Secondary | ICD-10-CM | POA: Diagnosis not present

## 2018-06-11 DIAGNOSIS — R438 Other disturbances of smell and taste: Secondary | ICD-10-CM | POA: Diagnosis not present

## 2018-06-11 DIAGNOSIS — R51 Headache: Secondary | ICD-10-CM | POA: Diagnosis not present

## 2018-06-11 DIAGNOSIS — Q870 Congenital malformation syndromes predominantly affecting facial appearance: Secondary | ICD-10-CM | POA: Diagnosis not present

## 2018-06-11 DIAGNOSIS — Z882 Allergy status to sulfonamides status: Secondary | ICD-10-CM | POA: Diagnosis not present

## 2018-06-11 DIAGNOSIS — Z87891 Personal history of nicotine dependence: Secondary | ICD-10-CM | POA: Diagnosis not present

## 2018-06-11 DIAGNOSIS — R42 Dizziness and giddiness: Secondary | ICD-10-CM | POA: Diagnosis not present

## 2018-06-11 DIAGNOSIS — J328 Other chronic sinusitis: Secondary | ICD-10-CM | POA: Diagnosis not present

## 2018-06-11 DIAGNOSIS — R432 Parageusia: Secondary | ICD-10-CM | POA: Diagnosis not present

## 2018-06-16 ENCOUNTER — Other Ambulatory Visit: Payer: Self-pay | Admitting: Internal Medicine

## 2018-06-23 ENCOUNTER — Telehealth: Payer: Self-pay | Admitting: Neurology

## 2018-06-23 NOTE — Telephone Encounter (Signed)
Pt would like to know if the nortriptyline (PAMELOR) 50 MG capsule that he's on is suppose to make him lazy, not wanting to do anything. Always feeling wore out. Please advise.

## 2018-06-23 NOTE — Telephone Encounter (Signed)
His nortriptyline 50mg  was decreased to one capsule at bedtime at his last visit on 04/23/18.    I left a detailed message on his voicemail (ok per DPR).  If his tiredness is new, it may not be related to his medication. However, if this has been an ongoing problem since being on nortriptyline, then I have requested him to return my call.

## 2018-07-08 ENCOUNTER — Encounter: Payer: Self-pay | Admitting: *Deleted

## 2018-07-08 ENCOUNTER — Telehealth: Payer: Self-pay | Admitting: Neurology

## 2018-07-08 NOTE — Telephone Encounter (Addendum)
I spoke to the patient.  He was given a prescription for Augmentin 875-125mg , one tablet BID x 14 days.  Says he was given this medication on 06/11/2018 for an active sinus infection.  He never started the medication. He is feeling better and says he is not going to take it now.

## 2018-07-08 NOTE — Telephone Encounter (Signed)
Pt was prescribed antibiotics for an infection that he has and would like a call back to be informed if it will conflict with his other medicines. Please advise.

## 2018-07-17 ENCOUNTER — Ambulatory Visit: Payer: BLUE CROSS/BLUE SHIELD | Admitting: Internal Medicine

## 2018-07-17 ENCOUNTER — Other Ambulatory Visit (INDEPENDENT_AMBULATORY_CARE_PROVIDER_SITE_OTHER): Payer: BLUE CROSS/BLUE SHIELD

## 2018-07-17 ENCOUNTER — Encounter: Payer: Self-pay | Admitting: Internal Medicine

## 2018-07-17 VITALS — BP 120/62 | HR 61 | Temp 98.1°F | Resp 16 | Ht 74.0 in | Wt 177.2 lb

## 2018-07-17 DIAGNOSIS — Z125 Encounter for screening for malignant neoplasm of prostate: Secondary | ICD-10-CM | POA: Diagnosis not present

## 2018-07-17 DIAGNOSIS — E538 Deficiency of other specified B group vitamins: Secondary | ICD-10-CM | POA: Insufficient documentation

## 2018-07-17 DIAGNOSIS — D696 Thrombocytopenia, unspecified: Secondary | ICD-10-CM | POA: Insufficient documentation

## 2018-07-17 DIAGNOSIS — E785 Hyperlipidemia, unspecified: Secondary | ICD-10-CM

## 2018-07-17 DIAGNOSIS — M722 Plantar fascial fibromatosis: Secondary | ICD-10-CM | POA: Insufficient documentation

## 2018-07-17 DIAGNOSIS — H9313 Tinnitus, bilateral: Secondary | ICD-10-CM | POA: Insufficient documentation

## 2018-07-17 DIAGNOSIS — I700 Atherosclerosis of aorta: Secondary | ICD-10-CM

## 2018-07-17 DIAGNOSIS — Z0000 Encounter for general adult medical examination without abnormal findings: Secondary | ICD-10-CM

## 2018-07-17 DIAGNOSIS — G320 Subacute combined degeneration of spinal cord in diseases classified elsewhere: Secondary | ICD-10-CM

## 2018-07-17 LAB — CBC WITH DIFFERENTIAL/PLATELET
Basophils Absolute: 0 10*3/uL (ref 0.0–0.1)
Basophils Relative: 0.5 % (ref 0.0–3.0)
EOS PCT: 2.3 % (ref 0.0–5.0)
Eosinophils Absolute: 0.1 10*3/uL (ref 0.0–0.7)
HCT: 43.6 % (ref 39.0–52.0)
Hemoglobin: 15 g/dL (ref 13.0–17.0)
Lymphocytes Relative: 33.4 % (ref 12.0–46.0)
Lymphs Abs: 2 10*3/uL (ref 0.7–4.0)
MCHC: 34.3 g/dL (ref 30.0–36.0)
MCV: 95.1 fl (ref 78.0–100.0)
Monocytes Absolute: 0.3 10*3/uL (ref 0.1–1.0)
Monocytes Relative: 5.1 % (ref 3.0–12.0)
Neutro Abs: 3.5 10*3/uL (ref 1.4–7.7)
Neutrophils Relative %: 58.7 % (ref 43.0–77.0)
Platelets: 154 10*3/uL (ref 150.0–400.0)
RBC: 4.59 Mil/uL (ref 4.22–5.81)
RDW: 13.8 % (ref 11.5–15.5)
WBC: 5.9 10*3/uL (ref 4.0–10.5)

## 2018-07-17 LAB — LIPID PANEL
CHOL/HDL RATIO: 5
Cholesterol: 180 mg/dL (ref 0–200)
HDL: 33 mg/dL — ABNORMAL LOW (ref >39.00)
LDL Cholesterol: 127 mg/dL — ABNORMAL HIGH (ref 0–99)
NonHDL: 147.06
Triglycerides: 98 mg/dL (ref 0.0–149.0)
VLDL: 19.6 mg/dL (ref 0.0–40.0)

## 2018-07-17 LAB — TSH: TSH: 0.84 u[IU]/mL (ref 0.35–4.50)

## 2018-07-17 LAB — COMPREHENSIVE METABOLIC PANEL
ALT: 10 U/L (ref 0–53)
AST: 10 U/L (ref 0–37)
Albumin: 4.3 g/dL (ref 3.5–5.2)
Alkaline Phosphatase: 81 U/L (ref 39–117)
BUN: 15 mg/dL (ref 6–23)
CO2: 25 mEq/L (ref 19–32)
CREATININE: 0.86 mg/dL (ref 0.40–1.50)
Calcium: 8.8 mg/dL (ref 8.4–10.5)
Chloride: 110 mEq/L (ref 96–112)
GFR: 92.57 mL/min (ref >60.00)
Glucose, Bld: 98 mg/dL (ref 70–99)
Potassium: 4.4 mEq/L (ref 3.5–5.1)
Sodium: 141 mEq/L (ref 135–145)
Total Bilirubin: 0.4 mg/dL (ref 0.2–1.2)
Total Protein: 6.4 g/dL (ref 6.0–8.3)

## 2018-07-17 LAB — FOLATE: Folate: 13.8 ng/mL (ref >5.9)

## 2018-07-17 LAB — PSA: PSA: 0.34 ng/mL (ref 0.10–4.00)

## 2018-07-17 LAB — VITAMIN B12: Vitamin B-12: 183 pg/mL — ABNORMAL LOW (ref 211–911)

## 2018-07-17 MED ORDER — ROSUVASTATIN CALCIUM 5 MG PO TABS: 5.0000 mg | ORAL_TABLET | Freq: Every day | ORAL | 1 refills | Status: DC

## 2018-07-17 MED ORDER — IBUPROFEN 600 MG PO TABS: 600.0000 mg | ORAL_TABLET | Freq: Four times a day (QID) | ORAL | 0 refills | Status: DC | PRN

## 2018-07-17 NOTE — Progress Notes (Signed)
Subjective:  Patient ID: Kenneth Gilbert, male    DOB: Jan 31, 1966  Age: 53 y.o. MRN: [id number]  CC: Hyperlipidemia; Annual Exam; and Foot Pain   HPI Kenneth Gilbert presents for a CPX.  He complains of a 2-week history of nontraumatic pain in the sole of his left foot.  He has not noticed any bruising or swelling.  He has not been taking the statin because he ran out.  He complains of a several month history of worsening numbness and tingling in his lower extremities, more on the left than the right.  He continues to complain of hearing loss, vertigo, and ataxia.  He is treated being treated by ENT and neurology.   Outpatient Medications Prior to Visit  Medication Sig Dispense Refill  . rizatriptan (MAXALT-MLT) 10 MG disintegrating tablet Take 1 tablet (10 mg total) by mouth as needed. May repeat in 2 hours if needed 12 tablet 11  . topiramate (TOPAMAX) 100 MG tablet Take 1 tablet (100 mg total) by mouth 2 (two) times daily. 60 tablet 11  . meclizine (ANTIVERT) 12.5 MG tablet Take 1 tablet (12.5 mg total) by mouth 3 (three) times daily as needed for dizziness. (Patient not taking: Reported on 07/17/2018) 30 tablet 1  . nortriptyline (PAMELOR) 50 MG capsule Take 2 capsules (100 mg total) by mouth at bedtime. (Patient not taking: Reported on 07/17/2018) 60 capsule 11  . rosuvastatin (CRESTOR) 5 MG tablet TAKE 1 TABLET BY MOUTH DAILY (Patient not taking: Reported on 07/17/2018) 90 tablet 1   No facility-administered medications prior to visit.     ROS Review of Systems  Constitutional: Negative.  Negative for diaphoresis, fatigue and unexpected weight change.  HENT: Negative.  Negative for trouble swallowing.   Eyes: Negative for visual disturbance.  Respiratory: Negative.  Negative for cough, chest tightness and shortness of breath.   Cardiovascular: Negative for chest pain, palpitations and leg swelling.  Gastrointestinal: Negative for abdominal pain, constipation, diarrhea, nausea  and vomiting.  Endocrine: Negative.   Genitourinary: Negative.  Negative for difficulty urinating, dysuria, penile swelling, scrotal swelling, testicular pain and urgency.  Musculoskeletal: Positive for arthralgias. Negative for back pain, myalgias and neck pain.  Skin: Negative.  Negative for color change, pallor and rash.  Neurological: Positive for dizziness and numbness. Negative for seizures, syncope, weakness and headaches.  Hematological: Negative for adenopathy. Does not bruise/bleed easily.  Psychiatric/Behavioral: Negative.     Objective:  BP 120/62 (BP Location: Left Arm, Patient Position: Sitting, Cuff Size: Normal)   Pulse 61   Temp 98.1 F (36.7 C) (Oral)   Resp 16   Ht 6\' 2"  (1.88 m)   Wt 177 lb 4 oz (80.4 kg)   SpO2 95%   BMI 22.76 kg/m   BP Readings from Last 3 Encounters:  07/17/18 120/62  04/23/18 111/68  01/30/18 136/89    Wt Readings from Last 3 Encounters:  07/17/18 177 lb 4 oz (80.4 kg)  04/23/18 181 lb 8 oz (82.3 kg)  01/30/18 179 lb 3.2 oz (81.3 kg)    Physical Exam Vitals signs reviewed.  Constitutional:      Appearance: Normal appearance. He is not ill-appearing or diaphoretic.  HENT:     Nose: Nose normal. No congestion or rhinorrhea.     Mouth/Throat:     Pharynx: Oropharynx is clear. No oropharyngeal exudate or posterior oropharyngeal erythema.  Eyes:     General: No scleral icterus.    Conjunctiva/sclera: Conjunctivae normal.  Neck:  Musculoskeletal: Normal range of motion and neck supple. No neck rigidity or muscular tenderness.  Cardiovascular:     Rate and Rhythm: Normal rate and regular rhythm.     Pulses:          Carotid pulses are 1+ on the right side and 1+ on the left side.      Radial pulses are 1+ on the right side and 1+ on the left side.       Femoral pulses are 1+ on the right side and 1+ on the left side.      Popliteal pulses are 1+ on the right side and 1+ on the left side.       Dorsalis pedis pulses are 1+ on  the right side and 1+ on the left side.       Posterior tibial pulses are 1+ on the right side and 1+ on the left side.     Heart sounds: No murmur. No gallop.   Pulmonary:     Effort: Pulmonary effort is normal.     Breath sounds: Normal breath sounds. No stridor. No wheezing, rhonchi or rales.  Abdominal:     General: Abdomen is flat. Bowel sounds are normal.     Palpations: There is no hepatomegaly, splenomegaly or mass.     Tenderness: There is no abdominal tenderness. There is no guarding.     Hernia: There is no hernia in the right inguinal area or left inguinal area.  Genitourinary:    Pubic Area: No rash.      Penis: Circumcised. No discharge, swelling or lesions.      Scrotum/Testes: Normal.        Right: Mass or tenderness not present.        Left: Mass or tenderness not present.     Epididymis:     Right: Normal. Not inflamed.     Left: Normal. Not inflamed or enlarged.     Prostate: Normal. Not enlarged, not tender and no nodules present.     Rectum: Normal. Guaiac result negative. No mass, tenderness, anal fissure, external hemorrhoid or internal hemorrhoid. Normal anal tone.  Musculoskeletal: Normal range of motion.        General: No swelling or tenderness.     Right lower leg: No edema.     Left lower leg: No edema.     Right foot: Normal range of motion. No deformity.     Left foot: Normal range of motion and normal capillary refill. No tenderness, bony tenderness, swelling, crepitus, deformity or laceration.  Feet:     Right foot:     Skin integrity: Skin integrity normal. No ulcer, blister or skin breakdown.     Toenail Condition: Right toenails are normal.     Left foot:     Skin integrity: Skin integrity normal. No ulcer, blister or skin breakdown.     Toenail Condition: Left toenails are normal.  Lymphadenopathy:     Lower Body: No right inguinal adenopathy. No left inguinal adenopathy.  Skin:    General: Skin is warm and dry.     Findings: No rash.    Neurological:     General: No focal deficit present.     Mental Status: He is oriented to person, place, and time. Mental status is at baseline.     Sensory: Sensory deficit present.  Psychiatric:        Mood and Affect: Mood normal.        Behavior: Behavior normal.  Thought Content: Thought content normal.        Judgment: Judgment normal.     Lab Results  Component Value Date   WBC 5.9 07/17/2018   HGB 15.0 07/17/2018   HCT 43.6 07/17/2018   PLT 154.0 07/17/2018   GLUCOSE 98 07/17/2018   CHOL 180 07/17/2018   TRIG 98.0 07/17/2018   HDL 33.00 (L) 07/17/2018   LDLCALC 127 (H) 07/17/2018   ALT 10 07/17/2018   AST 10 07/17/2018   NA 141 07/17/2018   K 4.4 07/17/2018   CL 110 07/17/2018   CREATININE 0.86 07/17/2018   BUN 15 07/17/2018   CO2 25 07/17/2018   TSH 0.84 07/17/2018   PSA 0.34 07/17/2018   INR 0.97 07/01/2009    No results found.  Assessment & Plan:   Kenneth Gilbert was seen today for hyperlipidemia, annual exam and foot pain.  Diagnoses and all orders for this visit:  Aortic atherosclerosis ([place])- I have asked him to improve on his risk factor modifications by restarting the statin and to quit smoking. -     Lipid panel; Future -     rosuvastatin (CRESTOR) 5 MG tablet; Take 1 tablet (5 mg total) by mouth daily.  Hyperlipidemia with target LDL less than 100- He has not achieved his LDL goal.  I have asked him to restart a statin. -     Comprehensive metabolic panel; Future -     Lipid panel; Future -     TSH; Future -     rosuvastatin (CRESTOR) 5 MG tablet; Take 1 tablet (5 mg total) by mouth daily.  Thrombocytopenia ([place])- His platelet count is normal now but he has B12 deficiency. -     CBC with Differential/Platelet; Future -     Comprehensive metabolic panel; Future -     Vitamin B12; Future -     Folate; Future  Routine general medical examination at a health care facility-  Exam completed, labs reviewed, vaccines reviewed, colon cancer  screening is up-to-date, patient education material was given. -     PSA; Future  Plantar fasciitis of left foot -     ibuprofen (ADVIL,MOTRIN) 600 MG tablet; Take 1 tablet (600 mg total) by mouth every 6 (six) hours as needed.  Neuromyelopathy due to vitamin B12 deficiency ([place])- I recommended that he start parenteral B12 replacement therapy.   I have discontinued Kenneth Gilbert. Amacher "RAY"'s meclizine and nortriptyline. I have also changed his rosuvastatin. Additionally, I am having him start on ibuprofen. Lastly, I am having him maintain his rizatriptan and topiramate.  Meds ordered this encounter  Medications  . ibuprofen (ADVIL,MOTRIN) 600 MG tablet    Sig: Take 1 tablet (600 mg total) by mouth every 6 (six) hours as needed.    Dispense:  90 tablet    Refill:  0  . rosuvastatin (CRESTOR) 5 MG tablet    Sig: Take 1 tablet (5 mg total) by mouth daily.    Dispense:  90 tablet    Refill:  1     Follow-up: Return in about 3 months (around 10/17/2018).  [name], MD

## 2018-07-17 NOTE — Patient Instructions (Signed)
Plantar Fasciitis      Plantar fasciitis is a painful foot condition that affects the heel. It occurs when the band of tissue that connects the toes to the heel bone (plantar fascia) becomes irritated. This can happen as the result of exercising too much or doing other repetitive activities (overuse injury).  The pain from plantar fasciitis can range from mild irritation to severe pain that makes it difficult to walk or move. The pain is usually worse in the morning after sleeping, or after sitting or lying down for a while. Pain may also be worse after long periods of walking or standing.  What are the causes?  This condition may be caused by:  Standing for long periods of time.  Wearing shoes that do not have good arch support.  Doing activities that put stress on joints (high-impact activities), including running, aerobics, and ballet.  Being overweight.  An abnormal way of walking (gait).  Tight muscles in the back of your lower leg (calf).  High arches in your feet.  Starting a new athletic activity.  What are the signs or symptoms?  The main symptom of this condition is heel pain. Pain may:  Be worse with first steps after a time of rest, especially in the morning after sleeping or after you have been sitting or lying down for a while.  Be worse after long periods of standing still.  Decrease after 30-45 minutes of activity, such as gentle walking.  How is this diagnosed?  This condition may be diagnosed based on your medical history and your symptoms. Your health care provider may ask questions about your activity level. Your health care provider will do a physical exam to check for:  A tender area on the bottom of your foot.  A high arch in your foot.  Pain when you move your foot.  Difficulty moving your foot.  You may have imaging tests to confirm the diagnosis, such as:  X-rays.  Ultrasound.  MRI.  How is this treated?  Treatment for plantar fasciitis depends on how severe your condition is.  Treatment may include:  Rest, ice, applying pressure (compression), and raising the affected foot (elevation). This may be called RICE therapy. Your health care provider may recommend RICE therapy along with over-the-counter pain medicines to manage your pain.  Exercises to stretch your calves and your plantar fascia.  A splint that holds your foot in a stretched, upward position while you sleep (night splint).  Physical therapy to relieve symptoms and prevent problems in the future.  Injections of steroid medicine (cortisone) to relieve pain and inflammation.  Stimulating your plantar fascia with electrical impulses (extracorporeal shock wave therapy). This is usually the last treatment option before surgery.  Surgery, if other treatments have not worked after 12 months.  Follow these instructions at home:      Managing pain, stiffness, and swelling  If directed, put ice on the painful area:  Put ice in a plastic bag, or use a frozen bottle of water.  Place a towel between your skin and the bag or bottle.  Roll the bottom of your foot over the bag or bottle.  Do this for 20 minutes, 2-3 times a day.  Wear athletic shoes that have air-sole or gel-sole cushions, or try wearing soft shoe inserts that are designed for plantar fasciitis.  Raise (elevate) your foot above the level of your heart while you are sitting or lying down.  Activity  Avoid activities that cause pain.   Ask your health care provider what activities are safe for you.  Do physical therapy exercises and stretches as told by your health care provider.  Try activities and forms of exercise that are easier on your joints (low-impact). Examples include swimming, water aerobics, and biking.  General instructions  Take over-the-counter and prescription medicines only as told by your health care provider.  Wear a night splint while sleeping, if told by your health care provider. Loosen the splint if your toes tingle, become numb, or turn cold and  blue.  Maintain a healthy weight, or work with your health care provider to lose weight as needed.  Keep all follow-up visits as told by your health care provider. This is important.  Contact a health care provider if you:  Have symptoms that do not go away after caring for yourself at home.  Have pain that gets worse.  Have pain that affects your ability to move or do your daily activities.  Summary  Plantar fasciitis is a painful foot condition that affects the heel. It occurs when the band of tissue that connects the toes to the heel bone (plantar fascia) becomes irritated.  The main symptom of this condition is heel pain that may be worse after exercising too much or standing still for a long time.  Treatment varies, but it usually starts with rest, ice, compression, and elevation (RICE therapy) and over-the-counter medicines to manage pain.  This information is not intended to replace advice given to you by your health care provider. Make sure you discuss any questions you have with your health care provider.  Document Released: 01/23/2001 Document Revised: 02/25/2017 Document Reviewed: 02/25/2017  Elsevier Interactive Patient Education © 2019 [employer]

## 2018-07-20 ENCOUNTER — Encounter: Payer: Self-pay | Admitting: Internal Medicine

## 2018-07-22 ENCOUNTER — Encounter: Payer: Self-pay | Admitting: *Deleted

## 2018-07-22 ENCOUNTER — Ambulatory Visit (INDEPENDENT_AMBULATORY_CARE_PROVIDER_SITE_OTHER): Payer: BLUE CROSS/BLUE SHIELD | Admitting: *Deleted

## 2018-07-22 DIAGNOSIS — E538 Deficiency of other specified B group vitamins: Secondary | ICD-10-CM | POA: Diagnosis not present

## 2018-07-22 MED ORDER — CYANOCOBALAMIN 1000 MCG/ML IJ SOLN
1000.0000 ug | Freq: Once | INTRAMUSCULAR | Status: AC
  Administered 2018-07-22: 1000 ug via INTRAMUSCULAR

## 2018-07-22 NOTE — Progress Notes (Signed)
I have reviewed and agree.

## 2018-07-29 ENCOUNTER — Ambulatory Visit (INDEPENDENT_AMBULATORY_CARE_PROVIDER_SITE_OTHER): Payer: BLUE CROSS/BLUE SHIELD | Admitting: *Deleted

## 2018-07-29 ENCOUNTER — Other Ambulatory Visit: Payer: Self-pay

## 2018-07-29 DIAGNOSIS — E538 Deficiency of other specified B group vitamins: Secondary | ICD-10-CM

## 2018-07-29 MED ORDER — CYANOCOBALAMIN 1000 MCG/ML IJ SOLN
1000.0000 ug | Freq: Once | INTRAMUSCULAR | Status: AC
  Administered 2018-07-29: 1000 ug via INTRAMUSCULAR

## 2018-07-29 NOTE — Progress Notes (Signed)
I have reviewed and agree.

## 2018-08-05 ENCOUNTER — Ambulatory Visit: Payer: BLUE CROSS/BLUE SHIELD

## 2018-08-11 ENCOUNTER — Telehealth: Payer: Self-pay

## 2018-08-11 DIAGNOSIS — G320 Subacute combined degeneration of spinal cord in diseases classified elsewhere: Principal | ICD-10-CM

## 2018-08-11 DIAGNOSIS — E538 Deficiency of other specified B group vitamins: Secondary | ICD-10-CM

## 2018-08-11 NOTE — Telephone Encounter (Signed)
Pt stated that his feet are burning and it is difficult to step on his feet. Please advise if there is an alternative medication to the ibuprofen that can be taken.   Pt stated that his feet are still hurting and he stated that he has started the b12 injects but since COVID he was informed to take the b12 oral 1000 mcg daily.   Please advise.

## 2018-08-11 NOTE — Telephone Encounter (Signed)
Copied from [place] [id number]. Topic: Appointment Scheduling - Scheduling Inquiry for Clinic >> Aug 11, 2018  1:31 PM Reyne Dumas L wrote: Reason for CRM:   Pt called and left voicemail on [place] 08/08/2018.  States that he is having issues walking and would like to see if he can be seen but isn't sure if he needs to see Dr. Ronnald Ramp or Sports meds. Pt can be reached at [phone] >> Aug 11, 2018  5:11 PM Cairrikier Dian Queen, CMA wrote: Contacted pt. Pt stated that his feet are still hurting and he stated that he has started the b12 injects but since COVID he was informed to take the b12 oral 1000 mcg daily.

## 2018-08-12 ENCOUNTER — Other Ambulatory Visit: Payer: Self-pay | Admitting: Internal Medicine

## 2018-08-12 DIAGNOSIS — G320 Subacute combined degeneration of spinal cord in diseases classified elsewhere: Principal | ICD-10-CM

## 2018-08-12 DIAGNOSIS — E538 Deficiency of other specified B group vitamins: Secondary | ICD-10-CM

## 2018-08-12 MED ORDER — CYANOCOBALAMIN 2000 MCG PO TABS: 2000.0000 ug | ORAL_TABLET | Freq: Every day | ORAL | 1 refills | Status: DC

## 2018-08-12 MED ORDER — PREGABALIN 50 MG PO CAPS: 50.0000 mg | ORAL_CAPSULE | Freq: Three times a day (TID) | ORAL | 3 refills | Status: DC

## 2018-08-12 NOTE — Telephone Encounter (Signed)
RX sent to his pharmacy for a med to try to control the pain

## 2018-08-12 NOTE — Telephone Encounter (Signed)
A prescription for something to relieve the neuropathy pain and a high-dose oral B12 supplement have been sent to his pharmacy.

## 2018-08-12 NOTE — Telephone Encounter (Signed)
Pt informed of below.  

## 2018-09-18 ENCOUNTER — Telehealth: Payer: Self-pay | Admitting: Neurology

## 2018-09-18 DIAGNOSIS — M722 Plantar fascial fibromatosis: Secondary | ICD-10-CM | POA: Diagnosis not present

## 2018-09-18 DIAGNOSIS — M79671 Pain in right foot: Secondary | ICD-10-CM | POA: Diagnosis not present

## 2018-09-18 DIAGNOSIS — M25571 Pain in right ankle and joints of right foot: Secondary | ICD-10-CM | POA: Diagnosis not present

## 2018-09-18 DIAGNOSIS — M79672 Pain in left foot: Secondary | ICD-10-CM | POA: Diagnosis not present

## 2018-09-18 DIAGNOSIS — M25572 Pain in left ankle and joints of left foot: Secondary | ICD-10-CM | POA: Diagnosis not present

## 2018-09-18 DIAGNOSIS — M71571 Other bursitis, not elsewhere classified, right ankle and foot: Secondary | ICD-10-CM | POA: Diagnosis not present

## 2018-09-18 NOTE — Telephone Encounter (Signed)
Dr. Felecia Shelling- This is a Dr. Krista Blue pt. You are work in this afternoon. Can you review and see if it's ok for him to take prednisone? Thank you

## 2018-09-18 NOTE — Telephone Encounter (Signed)
This should be ok.  

## 2018-09-18 NOTE — Telephone Encounter (Signed)
I called pt back. Relayed it should be okay to take per Dr. Felecia Shelling. He is aware Dr. Krista Blue out and Dr. Felecia Shelling covering MD.

## 2018-09-18 NOTE — Telephone Encounter (Signed)
Pt called in and wanted to know if it is okay to take prednisone he was prescribed by his podiatrist  CB# [phone]

## 2018-10-13 DIAGNOSIS — M7661 Achilles tendinitis, right leg: Secondary | ICD-10-CM | POA: Diagnosis not present

## 2018-10-13 DIAGNOSIS — M71571 Other bursitis, not elsewhere classified, right ankle and foot: Secondary | ICD-10-CM | POA: Diagnosis not present

## 2018-10-13 DIAGNOSIS — M722 Plantar fascial fibromatosis: Secondary | ICD-10-CM | POA: Diagnosis not present

## 2018-11-18 ENCOUNTER — Other Ambulatory Visit: Payer: Self-pay

## 2018-11-18 ENCOUNTER — Encounter: Payer: Self-pay | Admitting: Internal Medicine

## 2018-11-18 ENCOUNTER — Other Ambulatory Visit: Payer: BC Managed Care – PPO

## 2018-11-18 ENCOUNTER — Ambulatory Visit (INDEPENDENT_AMBULATORY_CARE_PROVIDER_SITE_OTHER): Payer: BC Managed Care – PPO | Admitting: Internal Medicine

## 2018-11-18 ENCOUNTER — Other Ambulatory Visit (INDEPENDENT_AMBULATORY_CARE_PROVIDER_SITE_OTHER): Payer: BC Managed Care – PPO

## 2018-11-18 VITALS — BP 120/80 | HR 86 | Temp 97.9°F | Resp 16 | Ht 74.0 in | Wt 174.2 lb

## 2018-11-18 DIAGNOSIS — R3121 Asymptomatic microscopic hematuria: Secondary | ICD-10-CM | POA: Diagnosis not present

## 2018-11-18 DIAGNOSIS — E785 Hyperlipidemia, unspecified: Secondary | ICD-10-CM

## 2018-11-18 DIAGNOSIS — D696 Thrombocytopenia, unspecified: Secondary | ICD-10-CM

## 2018-11-18 DIAGNOSIS — IMO0002 Reserved for concepts with insufficient information to code with codable children: Secondary | ICD-10-CM

## 2018-11-18 DIAGNOSIS — R82998 Other abnormal findings in urine: Secondary | ICD-10-CM

## 2018-11-18 DIAGNOSIS — G43709 Chronic migraine without aura, not intractable, without status migrainosus: Secondary | ICD-10-CM

## 2018-11-18 LAB — POC URINALSYSI DIPSTICK (AUTOMATED)
Bilirubin, UA: NEGATIVE
Blood, UA: POSITIVE
Glucose, UA: NEGATIVE
Ketones, UA: NEGATIVE
Leukocytes, UA: NEGATIVE
Nitrite, UA: NEGATIVE
Protein, UA: NEGATIVE
Spec Grav, UA: >=1.03 — AB (ref 1.010–1.025)
Urobilinogen, UA: 0.2 E.U./dL
pH, UA: 6 (ref 5.0–8.0)

## 2018-11-18 NOTE — Patient Instructions (Signed)
Hematuria, Adult Hematuria is blood in the urine. Blood may be visible in the urine, or it may be identified with a test. This condition can be caused by infections of the bladder, urethra, kidney, or prostate. Other possible causes include:  Kidney stones.  Cancer of the urinary tract.  Too much calcium in the urine.  Conditions that are passed from parent to child (inherited conditions).  Exercise that requires a lot of energy. Infections can usually be treated with medicine, and a kidney stone usually will pass through your urine. If neither of these is the cause of your hematuria, more tests may be needed to identify the cause of your symptoms. It is very important to tell your health care provider about any blood in your urine, even if it is painless or the blood stops without treatment. Blood in the urine, when it happens and then stops and then happens again, can be a symptom of a very serious condition, including cancer. There is no pain in the initial stages of many urinary cancers. Follow these instructions at home: Medicines  Take over-the-counter and prescription medicines only as told by your health care provider.  If you were prescribed an antibiotic medicine, take it as told by your health care provider. Do not stop taking the antibiotic even if you start to feel better. Eating and drinking  Drink enough fluid to keep your urine clear or pale yellow. It is recommended that you drink 3-4 quarts (2.8-3.8 L) a day. If you have been diagnosed with an infection, it is recommended that you drink cranberry juice in addition to large amounts of water.  Avoid caffeine, tea, and carbonated beverages. These tend to irritate the bladder.  Avoid alcohol because it may irritate the prostate (men). General instructions  If you have been diagnosed with a kidney stone, follow your health care provider's instructions about straining your urine to catch the stone.  Empty your bladder  often. Avoid holding urine for long periods of time.  If you are male: ? After a bowel movement, wipe from front to back and use each piece of toilet paper only once. ? Empty your bladder before and after sex.  Pay attention to any changes in your symptoms. Tell your health care provider about any changes or any new symptoms.  It is your responsibility to get your test results. Ask your health care provider, or the department performing the test, when your results will be ready.  Keep all follow-up visits as told by your health care provider. This is important. Contact a health care provider if:  You develop back pain.  You have a fever.  You have nausea or vomiting.  Your symptoms do not improve after 3 days.  Your symptoms get worse. Get help right away if:  You develop severe vomiting and are unable take medicine without vomiting.  You develop severe pain in your back or abdomen even though you are taking medicine.  You pass a large amount of blood in your urine.  You pass blood clots in your urine.  You feel very weak or like you might faint.  You faint. Summary  Hematuria is blood in the urine. It has many possible causes.  It is very important that you tell your health care provider about any blood in your urine, even if it is painless or the blood stops without treatment.  Take over-the-counter and prescription medicines only as told by your health care provider.  Drink enough fluid to keep   your urine clear or pale yellow. This information is not intended to replace advice given to you by your health care provider. Make sure you discuss any questions you have with your health care provider. Document Released: 04/30/2005 Document Revised: 04/12/2017 Document Reviewed: 06/02/2016 Elsevier Patient Education  2020 [employer].

## 2018-11-18 NOTE — Progress Notes (Signed)
Subjective:  Patient ID: Kenneth Gilbert, male    DOB: 11/20/1965  Age: 53 y.o. MRN: [id number]  CC: Hematuria   HPI ADRION MENZ presents for f/up - He complains of a 2-week history of painless dark urine.  Outpatient Medications Prior to Visit  Medication Sig Dispense Refill  . cyanocobalamin 2000 MCG tablet Take 1 tablet (2,000 mcg total) by mouth daily. 90 tablet 1  . rosuvastatin (CRESTOR) 5 MG tablet Take 1 tablet (5 mg total) by mouth daily. 90 tablet 1  . topiramate (TOPAMAX) 100 MG tablet Take 1 tablet (100 mg total) by mouth 2 (two) times daily. 60 tablet 11  . ibuprofen (ADVIL,MOTRIN) 600 MG tablet Take 1 tablet (600 mg total) by mouth every 6 (six) hours as needed. 90 tablet 0  . pregabalin (LYRICA) 50 MG capsule Take 1 capsule (50 mg total) by mouth 3 (three) times daily. 90 capsule 3  . rizatriptan (MAXALT-MLT) 10 MG disintegrating tablet Take 1 tablet (10 mg total) by mouth as needed. May repeat in 2 hours if needed 12 tablet 11   No facility-administered medications prior to visit.     ROS Review of Systems  Constitutional: Negative for chills, fatigue and fever.  HENT: Negative.   Eyes: Negative for visual disturbance.  Respiratory: Negative for cough, chest tightness, shortness of breath and wheezing.   Cardiovascular: Negative for chest pain, palpitations and leg swelling.  Gastrointestinal: Negative for abdominal pain, constipation, diarrhea, nausea and vomiting.  Endocrine: Negative for polyuria.  Genitourinary: Negative for decreased urine volume, difficulty urinating, discharge, dysuria, flank pain, frequency, hematuria, penile pain, penile swelling, scrotal swelling, testicular pain and urgency.  Musculoskeletal: Negative.  Negative for back pain.  Skin: Negative.  Negative for color change and rash.  Neurological: Negative.  Negative for dizziness, weakness and light-headedness.  Hematological: Negative for adenopathy. Does not bruise/bleed easily.   Psychiatric/Behavioral: Negative.     Objective:  BP 120/80 (BP Location: Left Arm, Patient Position: Sitting, Cuff Size: Normal)   Pulse 86   Temp 97.9 F (36.6 C) (Oral)   Resp 16   Ht 6\' 2"  (1.88 m)   Wt 174 lb 4 oz (79 kg)   SpO2 97%   BMI 22.37 kg/m   BP Readings from Last 3 Encounters:  11/18/18 120/80  07/17/18 120/62  04/23/18 111/68    Wt Readings from Last 3 Encounters:  11/18/18 174 lb 4 oz (79 kg)  07/17/18 177 lb 4 oz (80.4 kg)  04/23/18 181 lb 8 oz (82.3 kg)    Physical Exam Vitals signs reviewed.  Constitutional:      General: He is not in acute distress.    Appearance: He is not ill-appearing, toxic-appearing or diaphoretic.  HENT:     Nose: Nose normal.     Mouth/Throat:     Mouth: Mucous membranes are moist.  Eyes:     General: No scleral icterus.    Conjunctiva/sclera: Conjunctivae normal.  Neck:     Musculoskeletal: Normal range of motion. No neck rigidity or muscular tenderness.  Cardiovascular:     Rate and Rhythm: Normal rate and regular rhythm.     Heart sounds: No murmur.  Pulmonary:     Effort: Pulmonary effort is normal.     Breath sounds: No stridor. No wheezing, rhonchi or rales.  Abdominal:     General: Abdomen is flat. Bowel sounds are normal. There is no distension.     Palpations: There is no hepatomegaly or splenomegaly.  Tenderness: There is no abdominal tenderness.     Hernia: No hernia is present.  Genitourinary:    Pubic Area: No rash.      Penis: Normal. No discharge or swelling.      Scrotum/Testes: Normal.        Right: Mass, tenderness or swelling not present.        Left: Mass, tenderness or swelling not present.     Epididymis:     Right: Normal. Not inflamed or enlarged.     Left: Normal. Not inflamed or enlarged.     Prostate: Normal. Not enlarged, not tender and no nodules present.     Rectum: Normal. Guaiac result negative. No mass, tenderness, anal fissure, external hemorrhoid or internal hemorrhoid.  Normal anal tone.  Musculoskeletal: Normal range of motion.     Right lower leg: No edema.     Left lower leg: No edema.  Lymphadenopathy:     Cervical: No cervical adenopathy.  Skin:    General: Skin is warm and dry.     Coloration: Skin is not pale.  Neurological:     General: No focal deficit present.     Mental Status: He is alert.  Psychiatric:        Mood and Affect: Mood normal.        Behavior: Behavior normal.     Lab Results  Component Value Date   WBC 6.8 11/18/2018   HGB 14.9 11/18/2018   HCT 44.4 11/18/2018   PLT 153.0 11/18/2018   GLUCOSE 82 11/18/2018   CHOL 180 07/17/2018   TRIG 98.0 07/17/2018   HDL 33.00 (L) 07/17/2018   LDLCALC 127 (H) 07/17/2018   ALT 9 11/18/2018   AST 12 11/18/2018   NA 140 11/18/2018   K 4.1 11/18/2018   CL 110 11/18/2018   CREATININE 0.88 11/18/2018   BUN 12 11/18/2018   CO2 18 (L) 11/18/2018   TSH 0.84 07/17/2018   PSA 0.34 07/17/2018   INR 0.97 07/01/2009    Brown           Clarity, UA  clear         Glucose, UA Negative Negative         Bilirubin, UA  Negative         Ketones, UA  Negative         Spec Grav, UA 1.010 - 1.025 >=1.030Abnormal          Blood, UA  POSITIVE         Comment: 3+ 200 Ery/uL  pH, UA 5.0 - 8.0 6.0         Protein, UA Negative Negative         Urobilinogen, UA 0.2 or 1.0 E.U./dL 0.2    1.0 R  1.0 R  1.0 R  0.2 R   Nitrite, UA  Negative         Leukocytes, UA Negative Negative           Assessment & Plan:   Jasson was seen today for hematuria.  Diagnoses and all orders for this visit:  Dark urine- His urine is positive for 3+ blood. -     CBC with Differential/Platelet; Future -     Basic metabolic panel; Future -     Hepatic function panel; Future -     POCT Urinalysis Dipstick (Automated)  Hyperlipidemia with target LDL less than 100-he is doing well on the statin. -     Hepatic function panel;  Future  Thrombocytopenia ([place])- His platelet count is normal now.  Asymptomatic  microscopic hematuria- He has new onset gross hematuria.  His BMP shows a low bicarb consistent with a non-anion gap acidosis.  He has a history of stones and is taking Topamax.  I am concerned he may be developing renal tubular acidosis.  I have notified his neurologist and have asked him to hold the Topamax for 1 day and then to start at a lower dose tomorrow.  I anticipate tapering off of the dose of Topamax over the next few weeks.  I have also asked him to undergo a renal CT to see if there is a mass, stone, or evidence of obstruction. -     CULTURE, URINE COMPREHENSIVE; Future -     Chlamydia/Gonococcus/Trichomonas, NAA; Future -     CT RENAL ABD W/WO; Future   I have discontinued [name] R. Prescott "RAY"'s rizatriptan, ibuprofen, and pregabalin. I am also having him maintain his topiramate, rosuvastatin, and cyanocobalamin.  No orders of the defined types were placed in this encounter.    Follow-up: Return in about 3 weeks (around 12/09/2018).  [name], MD

## 2018-11-19 ENCOUNTER — Telehealth: Payer: Self-pay | Admitting: Internal Medicine

## 2018-11-19 ENCOUNTER — Encounter: Payer: Self-pay | Admitting: Internal Medicine

## 2018-11-19 LAB — CBC WITH DIFFERENTIAL/PLATELET
Basophils Absolute: 0 10*3/uL (ref 0.0–0.1)
Basophils Relative: 0.6 % (ref 0.0–3.0)
Eosinophils Absolute: 0.1 10*3/uL (ref 0.0–0.7)
Eosinophils Relative: 1.9 % (ref 0.0–5.0)
HCT: 44.4 % (ref 39.0–52.0)
Hemoglobin: 14.9 g/dL (ref 13.0–17.0)
Lymphocytes Relative: 38.3 % (ref 12.0–46.0)
Lymphs Abs: 2.6 10*3/uL (ref 0.7–4.0)
MCHC: 33.6 g/dL (ref 30.0–36.0)
MCV: 95.4 fl (ref 78.0–100.0)
Monocytes Absolute: 0.4 10*3/uL (ref 0.1–1.0)
Monocytes Relative: 6.3 % (ref 3.0–12.0)
Neutro Abs: 3.6 10*3/uL (ref 1.4–7.7)
Neutrophils Relative %: 52.9 % (ref 43.0–77.0)
Platelets: 153 10*3/uL (ref 150.0–400.0)
RBC: 4.66 Mil/uL (ref 4.22–5.81)
RDW: 13.1 % (ref 11.5–15.5)
WBC: 6.8 10*3/uL (ref 4.0–10.5)

## 2018-11-19 LAB — BASIC METABOLIC PANEL
BUN: 12 mg/dL (ref 6–23)
CO2: 18 mEq/L — ABNORMAL LOW (ref 19–32)
Calcium: 9.1 mg/dL (ref 8.4–10.5)
Chloride: 110 mEq/L (ref 96–112)
Creatinine, Ser: 0.88 mg/dL (ref 0.40–1.50)
GFR: 90.04 mL/min (ref >60.00)
Glucose, Bld: 82 mg/dL (ref 70–99)
Potassium: 4.1 mEq/L (ref 3.5–5.1)
Sodium: 140 mEq/L (ref 135–145)

## 2018-11-19 LAB — HEPATIC FUNCTION PANEL
ALT: 9 U/L (ref 0–53)
AST: 12 U/L (ref 0–37)
Albumin: 4.5 g/dL (ref 3.5–5.2)
Alkaline Phosphatase: 75 U/L (ref 39–117)
Bilirubin, Direct: 0 mg/dL (ref 0.0–0.3)
Total Bilirubin: 0.2 mg/dL (ref 0.2–1.2)
Total Protein: 6.9 g/dL (ref 6.0–8.3)

## 2018-11-19 MED ORDER — TOPIRAMATE 50 MG PO TABS: 50.0000 mg | ORAL_TABLET | Freq: Two times a day (BID) | ORAL | 0 refills | Status: DC

## 2018-11-19 NOTE — Telephone Encounter (Signed)
Copied from [place] [id number]. Topic: General - Other >> Nov 19, 2018  2:37 PM Virl Axe D wrote: Reason for CRM: Pt returned call for lab results from 11/18/18. No answer on FC line x3. No documentation stating NT can give results. Please return call to [phone], pt's mobile >> Nov 19, 2018  6:29 PM Waylan Rocher, [place] L wrote: Patent calling for lab results. NO [facility] RN available. Documentation for [facility] to give results on lab note.

## 2018-11-20 ENCOUNTER — Telehealth: Payer: Self-pay | Admitting: Neurology

## 2018-11-20 LAB — CULTURE, URINE COMPREHENSIVE
MICRO NUMBER:: 641350
RESULT:: NO GROWTH
SPECIMEN QUALITY:: ADEQUATE

## 2018-11-20 MED ORDER — ZONISAMIDE 100 MG PO CAPS: 100.0000 mg | ORAL_CAPSULE | Freq: Every day | ORAL | 11 refills | Status: DC

## 2018-11-20 NOTE — Telephone Encounter (Signed)
Per vo by Dr. Krista Blue, stop topiramate.  She has sent him Zonegran 100mg , one tablet BID.

## 2018-11-20 NOTE — Telephone Encounter (Signed)
According to lab note pt was contacted and made aware of lab. Nothing further is needed

## 2018-11-20 NOTE — Addendum Note (Signed)
Addended by: Noberto Retort C on: 11/20/2018 04:51 PM   Modules accepted: Orders

## 2018-11-20 NOTE — Addendum Note (Signed)
Addended by: Marcial Pacas on: 11/20/2018 04:52 PM   Modules accepted: Orders

## 2018-11-20 NOTE — Telephone Encounter (Signed)
Called pt. Relayed Dr. Rhea Belton recommendation. He was agreeable to this. He will pick up zonegran from pharmacy and start this. He will call back if he has further questions or concerns.

## 2018-11-20 NOTE — Telephone Encounter (Signed)
Pt is asking for a call to discuss how the topiramate (TOPAMAX) 50 MG tablet has affected him, please call

## 2018-11-20 NOTE — Telephone Encounter (Addendum)
States that topiramate 100mg  BID has been controlling his dizziness.  He has recently been seen by his PCP for hematuria.  Also, reports history of kidney stone (one stone in 1997).  His topiramate was decreased to 50mg  BID.  He is under the impression that the medication would be discontinued completely. States he was instructed to reach out to Dr. Krista Blue to made the decision about the topiramate and any potential new medication to replace it.

## 2018-11-21 LAB — CHLAMYDIA/GONOCOCCUS/TRICHOMONAS, NAA
Chlamydia by NAA: NEGATIVE
Gonococcus by NAA: NEGATIVE
Trich vag by NAA: NEGATIVE

## 2018-11-21 NOTE — Telephone Encounter (Signed)
Pt called and stated that when he picked up his medication Zonegran 100mg  on the bottle it states that he is to take it once a day. But the pt states he was told by the nurse BID. Pt states he will start off with once a day as instructed on the bottle but would like to be called back when the office reopens so that this can be clarified with him. Please advise.

## 2018-11-24 NOTE — Telephone Encounter (Signed)
I have spoken with the patient and he is aware that the dosing for Zonegran is once daily.

## 2018-11-28 ENCOUNTER — Other Ambulatory Visit: Payer: Self-pay | Admitting: Internal Medicine

## 2018-11-28 ENCOUNTER — Encounter: Payer: Self-pay | Admitting: Internal Medicine

## 2018-11-28 DIAGNOSIS — R3121 Asymptomatic microscopic hematuria: Secondary | ICD-10-CM

## 2018-11-28 NOTE — Addendum Note (Signed)
Addended by: Aviva Signs M on: 11/28/2018 11:49 AM   Modules accepted: Orders

## 2018-12-08 ENCOUNTER — Encounter: Payer: Self-pay | Admitting: Internal Medicine

## 2018-12-08 ENCOUNTER — Ambulatory Visit (INDEPENDENT_AMBULATORY_CARE_PROVIDER_SITE_OTHER): Payer: BC Managed Care – PPO | Admitting: Internal Medicine

## 2018-12-08 ENCOUNTER — Other Ambulatory Visit: Payer: Self-pay

## 2018-12-08 ENCOUNTER — Other Ambulatory Visit (INDEPENDENT_AMBULATORY_CARE_PROVIDER_SITE_OTHER): Payer: BC Managed Care – PPO

## 2018-12-08 VITALS — BP 118/68 | HR 72 | Temp 98.5°F | Resp 16 | Ht 74.0 in | Wt 177.5 lb

## 2018-12-08 DIAGNOSIS — R3121 Asymptomatic microscopic hematuria: Secondary | ICD-10-CM

## 2018-12-08 LAB — URINALYSIS, ROUTINE W REFLEX MICROSCOPIC
Bilirubin Urine: NEGATIVE
Ketones, ur: NEGATIVE
Leukocytes,Ua: NEGATIVE
Nitrite: NEGATIVE
Specific Gravity, Urine: 1.015 (ref 1.000–1.030)
Total Protein, Urine: NEGATIVE
Urine Glucose: NEGATIVE
Urobilinogen, UA: 0.2 (ref 0.0–1.0)
WBC, UA: NONE SEEN (ref 0–?)
pH: 6 (ref 5.0–8.0)

## 2018-12-08 LAB — CBC WITH DIFFERENTIAL/PLATELET
Basophils Absolute: 0 10*3/uL (ref 0.0–0.1)
Basophils Relative: 0.6 % (ref 0.0–3.0)
Eosinophils Absolute: 0.3 10*3/uL (ref 0.0–0.7)
Eosinophils Relative: 4.6 % (ref 0.0–5.0)
HCT: 44.8 % (ref 39.0–52.0)
Hemoglobin: 15.2 g/dL (ref 13.0–17.0)
Lymphocytes Relative: 28.6 % (ref 12.0–46.0)
Lymphs Abs: 1.8 10*3/uL (ref 0.7–4.0)
MCHC: 33.8 g/dL (ref 30.0–36.0)
MCV: 96.4 fl (ref 78.0–100.0)
Monocytes Absolute: 0.3 10*3/uL (ref 0.1–1.0)
Monocytes Relative: 4.6 % (ref 3.0–12.0)
Neutro Abs: 3.9 10*3/uL (ref 1.4–7.7)
Neutrophils Relative %: 61.6 % (ref 43.0–77.0)
Platelets: 145 10*3/uL — ABNORMAL LOW (ref 150.0–400.0)
RBC: 4.65 Mil/uL (ref 4.22–5.81)
RDW: 13.1 % (ref 11.5–15.5)
WBC: 6.3 10*3/uL (ref 4.0–10.5)

## 2018-12-08 LAB — BASIC METABOLIC PANEL
BUN: 11 mg/dL (ref 6–23)
CO2: 22 mEq/L (ref 19–32)
Calcium: 8.9 mg/dL (ref 8.4–10.5)
Chloride: 110 mEq/L (ref 96–112)
Creatinine, Ser: 0.82 mg/dL (ref 0.40–1.50)
GFR: 97.66 mL/min (ref >60.00)
Glucose, Bld: 90 mg/dL (ref 70–99)
Potassium: 4.3 mEq/L (ref 3.5–5.1)
Sodium: 141 mEq/L (ref 135–145)

## 2018-12-08 NOTE — Progress Notes (Signed)
Subjective:  Patient ID: Kenneth Gilbert, male    DOB: November 21, 1965  Age: 53 y.o. MRN: [id number]  CC: Hematuria   HPI Kenneth Gilbert presents for f/up - He tells me that the dark color of his urine has resolved. He has not had the CT scan yet. He has stopped taking topiramate.  Outpatient Medications Prior to Visit  Medication Sig Dispense Refill  . cyanocobalamin 2000 MCG tablet Take 1 tablet (2,000 mcg total) by mouth daily. 90 tablet 1  . rosuvastatin (CRESTOR) 5 MG tablet Take 1 tablet (5 mg total) by mouth daily. 90 tablet 1  . zonisamide (ZONEGRAN) 100 MG capsule Take 1 capsule (100 mg total) by mouth daily. 60 capsule 11   No facility-administered medications prior to visit.     ROS Review of Systems  Constitutional: Negative.  Negative for appetite change, diaphoresis and fatigue.  HENT: Negative.   Eyes: Negative.   Respiratory: Negative for cough, shortness of breath and wheezing.   Cardiovascular: Negative for chest pain, palpitations and leg swelling.  Gastrointestinal: Negative for abdominal pain, constipation, diarrhea and nausea.  Endocrine: Negative.   Genitourinary: Negative.  Negative for difficulty urinating, flank pain, frequency and hematuria.  Musculoskeletal: Negative.  Negative for arthralgias and myalgias.  Skin: Negative.  Negative for color change and rash.  Neurological: Negative.  Negative for dizziness, weakness, light-headedness and headaches.  Hematological: Negative.   Psychiatric/Behavioral: Negative.     Objective:  BP 118/68 (BP Location: Left Arm, Patient Position: Sitting, Cuff Size: Normal)   Pulse 72   Temp 98.5 F (36.9 C) (Oral)   Resp 16   Ht 6\' 2"  (1.88 m)   Wt 177 lb 8 oz (80.5 kg)   SpO2 95%   BMI 22.79 kg/m   BP Readings from Last 3 Encounters:  12/08/18 118/68  11/18/18 120/80  07/17/18 120/62    Wt Readings from Last 3 Encounters:  12/08/18 177 lb 8 oz (80.5 kg)  11/18/18 174 lb 4 oz (79 kg)  07/17/18 177 lb  4 oz (80.4 kg)    Physical Exam Vitals signs reviewed.  Constitutional:      Appearance: He is not ill-appearing or diaphoretic.  HENT:     Nose: Nose normal.     Mouth/Throat:     Mouth: Mucous membranes are moist.     Pharynx: No oropharyngeal exudate.  Eyes:     General: No scleral icterus.    Conjunctiva/sclera: Conjunctivae normal.  Neck:     Musculoskeletal: Normal range of motion. No neck rigidity or muscular tenderness.  Cardiovascular:     Rate and Rhythm: Normal rate and regular rhythm.     Heart sounds: No murmur.  Pulmonary:     Effort: Pulmonary effort is normal. No respiratory distress.     Breath sounds: No stridor. No wheezing, rhonchi or rales.  Abdominal:     General: Abdomen is flat. Bowel sounds are normal. There is no distension.     Palpations: Abdomen is soft. There is no hepatomegaly, splenomegaly or mass.     Tenderness: There is no abdominal tenderness.  Musculoskeletal: Normal range of motion.     Right lower leg: No edema.     Left lower leg: No edema.  Lymphadenopathy:     Cervical: No cervical adenopathy.  Skin:    General: Skin is warm and dry.     Coloration: Skin is not pale.     Findings: No erythema or rash.  Neurological:  General: No focal deficit present.     Mental Status: He is oriented to person, place, and time. Mental status is at baseline.  Psychiatric:        Mood and Affect: Mood normal.        Behavior: Behavior normal.        Thought Content: Thought content normal.        Judgment: Judgment normal.     Lab Results  Component Value Date   WBC 6.3 12/08/2018   HGB 15.2 12/08/2018   HCT 44.8 12/08/2018   PLT 145.0 (L) 12/08/2018   GLUCOSE 90 12/08/2018   CHOL 180 07/17/2018   TRIG 98.0 07/17/2018   HDL 33.00 (L) 07/17/2018   LDLCALC 127 (H) 07/17/2018   ALT 9 11/18/2018   AST 12 11/18/2018   NA 141 12/08/2018   K 4.3 12/08/2018   CL 110 12/08/2018   CREATININE 0.82 12/08/2018   BUN 11 12/08/2018   CO2 22  12/08/2018   TSH 0.84 07/17/2018   PSA 0.34 07/17/2018   INR 0.97 07/01/2009    No results found.  Assessment & Plan:   Kenneth Gilbert was seen today for hematuria.  Diagnoses and all orders for this visit:  Asymptomatic microscopic hematuria- The hematuria has resolved.  His renal function, acid-base status, and electrolytes are normal now.  It appears that the prior scenario was probably caused by topiramate.  He will go ahead and undergo the renal CT to be certain that there are no renal masses or stones. -     Basic metabolic panel; Future -     CBC with Differential/Platelet; Future -     Urinalysis, Routine w reflex microscopic; Future   I am having Kenneth Look. Blum "RAY" maintain his rosuvastatin, cyanocobalamin, and zonisamide.  No orders of the defined types were placed in this encounter.    Follow-up: No follow-ups on file.  [name], MD

## 2018-12-08 NOTE — Patient Instructions (Signed)
Hematuria, Adult Hematuria is blood in the urine. Blood may be visible in the urine, or it may be identified with a test. This condition can be caused by infections of the bladder, urethra, kidney, or prostate. Other possible causes include:  Kidney stones.  Cancer of the urinary tract.  Too much calcium in the urine.  Conditions that are passed from parent to child (inherited conditions).  Exercise that requires a lot of energy. Infections can usually be treated with medicine, and a kidney stone usually will pass through your urine. If neither of these is the cause of your hematuria, more tests may be needed to identify the cause of your symptoms. It is very important to tell your health care provider about any blood in your urine, even if it is painless or the blood stops without treatment. Blood in the urine, when it happens and then stops and then happens again, can be a symptom of a very serious condition, including cancer. There is no pain in the initial stages of many urinary cancers. Follow these instructions at home: Medicines  Take over-the-counter and prescription medicines only as told by your health care provider.  If you were prescribed an antibiotic medicine, take it as told by your health care provider. Do not stop taking the antibiotic even if you start to feel better. Eating and drinking  Drink enough fluid to keep your urine clear or pale yellow. It is recommended that you drink 3-4 quarts (2.8-3.8 L) a day. If you have been diagnosed with an infection, it is recommended that you drink cranberry juice in addition to large amounts of water.  Avoid caffeine, tea, and carbonated beverages. These tend to irritate the bladder.  Avoid alcohol because it may irritate the prostate (men). General instructions  If you have been diagnosed with a kidney stone, follow your health care provider's instructions about straining your urine to catch the stone.  Empty your bladder  often. Avoid holding urine for long periods of time.  If you are male: ? After a bowel movement, wipe from front to back and use each piece of toilet paper only once. ? Empty your bladder before and after sex.  Pay attention to any changes in your symptoms. Tell your health care provider about any changes or any new symptoms.  It is your responsibility to get your test results. Ask your health care provider, or the department performing the test, when your results will be ready.  Keep all follow-up visits as told by your health care provider. This is important. Contact a health care provider if:  You develop back pain.  You have a fever.  You have nausea or vomiting.  Your symptoms do not improve after 3 days.  Your symptoms get worse. Get help right away if:  You develop severe vomiting and are unable take medicine without vomiting.  You develop severe pain in your back or abdomen even though you are taking medicine.  You pass a large amount of blood in your urine.  You pass blood clots in your urine.  You feel very weak or like you might faint.  You faint. Summary  Hematuria is blood in the urine. It has many possible causes.  It is very important that you tell your health care provider about any blood in your urine, even if it is painless or the blood stops without treatment.  Take over-the-counter and prescription medicines only as told by your health care provider.  Drink enough fluid to keep   your urine clear or pale yellow. This information is not intended to replace advice given to you by your health care provider. Make sure you discuss any questions you have with your health care provider. Document Released: 04/30/2005 Document Revised: 04/12/2017 Document Reviewed: 06/02/2016 Elsevier Patient Education  2020 [employer].

## 2018-12-09 ENCOUNTER — Encounter: Payer: Self-pay | Admitting: Internal Medicine

## 2018-12-09 ENCOUNTER — Ambulatory Visit
Admission: RE | Admit: 2018-12-09 | Discharge: 2018-12-09 | Disposition: A | Discharge status: Home / Self Care | Payer: BC Managed Care – PPO | Source: Ambulatory Visit | Attending: Internal Medicine | Admitting: Internal Medicine

## 2018-12-09 DIAGNOSIS — I77811 Abdominal aortic ectasia: Secondary | ICD-10-CM | POA: Diagnosis not present

## 2018-12-09 DIAGNOSIS — R3121 Asymptomatic microscopic hematuria: Secondary | ICD-10-CM

## 2018-12-09 DIAGNOSIS — N200 Calculus of kidney: Secondary | ICD-10-CM | POA: Diagnosis not present

## 2018-12-09 MED ORDER — IOPAMIDOL (ISOVUE-300) INJECTION 61%
125.0000 mL | Freq: Once | INTRAVENOUS | Status: AC | PRN
  Administered 2018-12-09: 125 mL via INTRAVENOUS

## 2018-12-15 ENCOUNTER — Telehealth: Payer: Self-pay

## 2018-12-15 NOTE — Telephone Encounter (Signed)
Copied from [place] [phone]. Topic: General - Other >> Dec 12, 2018  4:16 PM Celene Kras A wrote: Reason for CRM: Pt called stating he would like his lab results. Please advise

## 2018-12-15 NOTE — Telephone Encounter (Signed)
Contacted pt and went over the letters with the results.

## 2018-12-18 MED ORDER — DIVALPROEX SODIUM ER 500 MG PO TB24: 500.0000 mg | ORAL_TABLET | Freq: Every day | ORAL | 11 refills | Status: DC

## 2018-12-18 NOTE — Telephone Encounter (Signed)
Pt called and stated that the Zonegran is keeping him up at night and giving him nightmares. Please advise.

## 2018-12-18 NOTE — Addendum Note (Signed)
Addended by: Marcial Pacas on: 12/18/2018 03:22 PM   Modules accepted: Orders

## 2018-12-18 NOTE — Telephone Encounter (Signed)
Please advise patient to stop zonisamide, I have started Depakote ER 500mg  [facility].  Last visit was in Dec 2019, multiple phone calls in between, please schedule follow up with NP Sarah at her next available.

## 2018-12-18 NOTE — Telephone Encounter (Signed)
If pt calls back please schedule him with Judson Roch NP per Dr.Yan recommendations for next available appt. Left vm for him to stop zonisamide and start  depakote Er 500mg  [facility]. Depakote was sent to pharmacy today.

## 2018-12-18 NOTE — Telephone Encounter (Addendum)
I called pt and stated per Dr. Krista Blue to stop taking the zonisamide as of today. I advise him she sent a new rx of depakote ER 500mg  to take at [facility]. Pt verbalized understanding, and will call the pharmacy.Pt was also reminded of his appt with Sarah NP on 12/23/2018 at 0815am check in a 0745am. Also to wear a mask at appt.

## 2018-12-18 NOTE — Telephone Encounter (Signed)
Message sent to Dr.Yan

## 2018-12-21 ENCOUNTER — Telehealth: Payer: Self-pay | Admitting: Neurology

## 2018-12-21 NOTE — Telephone Encounter (Signed)
The patient called and thought that he had the wrong prescription, he thought his medication was supposed to be 100 mg when he was given a 500 mg prescription for Depakote.  I indicated that he had the right prescription he is to take the Depakote, the Zonegran was the 100 mg prescription.

## 2018-12-22 NOTE — Progress Notes (Signed)
PATIENT: Kenneth Gilbert DOB: 1965-10-28  REASON FOR VISIT: follow up HISTORY FROM: patient  HISTORY OF PRESENT ILLNESS: Today 12/23/18  HISTORY  Kenneth Gilbert a 53 year old male,seen in request by his primary care physician Dr.Jones, Marcello Moores Lfor evaluation of dizziness, initial evaluation was on January 09, 2018,  He reported gradual onset of dizziness since 2017, he describes his dizziness as woozy headed, it can happen in sitting down, standing up position, but he is a poor historian, it is hard to elaborate on the detail of his symptoms, he did have a history of bilateral otitis media, with hearing loss, worse on the left side, wearing left side hearing aid/amplifier from [place],  In the background of frequent lightheadedness, he also reported spells of sudden onset spinning sensation, he presented to the emergency room on December 19, 2017, 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  On further questioning, patient reported frequent headaches, pounding, severe, with associated dizziness, sometimes nausea, also complains of difficulty sleeping, sometimes during intense vertigo and headaches, he had transient loss of consciousness for a few minutes,  MRI of the brain without contrast in July 2019 was normal, chronic right mid ear and mastoid inflammatory changes,  CTA head and neck: no significant stenosis.  Lab in August 2019, creat 0.8, Hg 16.3, UDSis positive for marijuana,CMP showed mild elevated glucose 108,cbc,alcohol level less than 10  UPDATE Sept 19 2019: He was seen by [facility] Dr. Rachell Cipro on Sept 17 2019: No evidence of significant peripheral or central vestibular  dysfunction, no obvious of ophthalmologic abnormality would explain his recurrent episode of dizziness with associated headache,  He started taking nortriptyline 50 mg every night tolerating it well, but did not notice significant improvement, continue complains of foggy sensation, difficulty sleeping, woke up multiple times, irregular sleep pattern, sometimes catch a cat nap on his couch, complains of anxiety, there was also intermittent episode of sudden onset intense vertigo with associated headache, it can last couple hours, this has put significant limitation on his performance, he has limited income, 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  UPDATE Apr 23 2018: He is tolerating Topamax 100 mg twice a day well, also nortriptyline 50 mg 2 tablets every night, He rarely has significant vertigo, but with sudden positional change, he still felt lightheaded, also complains of dryness, constipation, frequent dreams,  Update December 23, 2630 SS: 53 year old with dizziness and chronic headache, he has had multiple telephone calls about medication questions, he has stopped taking Topamax, was started on Zonegran, but became confused about the medication regimen.  He was started on Depakote ER 500 mg at bedtime.  He is no longer taking nortriptyline.   The dizziness is improved. He has spells every now and then, but not as often. Is unsure of trigger. He may have a few spells a month. He doesn't keep track. He is poor historian. When he has a bad spell, he will lay down, he starts sweating, it will subside. With bad spells, he will take Maxalt and it will subside. He has just started Depakote, 2 days ago. He will have headache when he gets bad dizziness. He denies CP, SOB, or palpations with spells. He has minor  dizzy spell today in office, no sweating or other symptoms, was very brief.   REVIEW OF SYSTEMS: Out of a complete 14 system review of symptoms, the patient complains  only of the following symptoms, and all other reviewed systems are negative.  Dizziness  ALLERGIES: Allergies  Allergen Reactions  . Codeine     REACTION: Dizzy  . Topamax [Topiramate] Other (See Comments)    Kidney stone/hematuria    HOME MEDICATIONS: Outpatient Medications Prior to Visit  Medication Sig Dispense Refill  . cyanocobalamin 2000 MCG tablet Take 1 tablet (2,000 mcg total) by mouth daily. 90 tablet 1  . divalproex (DEPAKOTE ER) 500 MG 24 hr tablet Take 1 tablet (500 mg total) by mouth at bedtime. 30 tablet 11  . rosuvastatin (CRESTOR) 5 MG tablet Take 1 tablet (5 mg total) by mouth daily. 90 tablet 1   No facility-administered medications prior to visit.     PAST MEDICAL HISTORY: Past Medical History:  Diagnosis Date  . Arthritis   . Dizziness   . ETOH abuse   . Hard of hearing   . Headache   . Hyperlipidemia     PAST SURGICAL HISTORY: Past Surgical History:  Procedure Laterality Date  . arm surgery Right   . arm surgery Left   . INNER EAR SURGERY     as a child  . SHOULDER SURGERY Left     FAMILY HISTORY: Family History  Problem Relation Age of Onset  . Cancer Father        unknown type  . Cirrhosis Sister   . Heart disease Mother   . Cancer Brother     SOCIAL HISTORY: Social History   Socioeconomic History  . Marital status: Divorced    Spouse name: Not on file  . Number of children: 4  . Years of education: 71  . Highest education level: High school graduate  Occupational History  . Occupation: Sales executive, oil changes, rotates tires  Social Needs  . Financial resource strain: Not on file  . Food insecurity    Worry: Not on file    Inability: Not on file  . Transportation needs    Medical: Not on file    Non-medical: Not on file  Tobacco Use  . Smoking status: Current Every Day Smoker    Packs/day: 1.00    Types: Cigarettes    Start date: 06/11/1978  . Smokeless tobacco: Never Used  Substance and Sexual Activity  .  Alcohol use: No    Frequency: Never    Comment: 01/09/18 - reports no use in 3 years  . Drug use: Yes    Types: Marijuana    Comment: 01/09/18 - uses marijuana occasionally  . Sexual activity: Yes    Partners: Female  Lifestyle  . Physical activity    Days per week: Not on file    Minutes per session: Not on file  . Stress: Not on file  Relationships  . Social Herbalist on phone: Not on file    Gets together: Not on file    Attends religious service: Not on file    Active member of club or organization: Not on file    Attends meetings of clubs or organizations: Not on file    Relationship status: Not on file  . Intimate partner violence    Fear of current or ex partner: Not on file    Emotionally abused: Not on file    Physically abused: Not on file  Forced sexual activity: Not on file  Other Topics Concern  . Not on file  Social History Narrative   Lives at home with his son.   Caffeine use:  Drinks 3-4 cans of [place] tea (20oz cans)   Right-handed.    PHYSICAL EXAM  Vitals:   12/23/18 0755  BP: 106/66  Pulse: 75  Temp: 98.4 F (36.9 C)  TempSrc: Oral  Weight: 176 lb 3.2 oz (79.9 kg)  Height: 6\' 2"  (1.88 m)   Body mass index is 22.62 kg/m.  Generalized: Well developed, in no acute distress   Neurological examination  Mentation: Alert oriented to time, place, history taking. Follows all commands speech and language fluent Cranial nerve II-XII: Pupils were equal round reactive to light. Extraocular movements were full, visual field were full on confrontational test. Facial sensation and strength were normal.  Head turning and shoulder shrug  were normal and symmetric. Motor: The motor testing reveals 5 over 5 strength of all 4 extremities. Good symmetric motor tone is noted throughout.  Sensory: Sensory testing is intact to soft touch on all 4 extremities. No evidence of extinction is noted.  Coordination: Cerebellar testing reveals good  finger-nose-finger and heel-to-shin bilaterally.  Gait and station: Gait is normal. Tandem gait is normal. .  Reflexes: Deep tendon reflexes are symmetric and normal bilaterally.   DIAGNOSTIC DATA (LABS, IMAGING, TESTING) - I reviewed patient records, labs, notes, testing and imaging myself where available.  Lab Results  Component Value Date   WBC 6.3 12/08/2018   HGB 15.2 12/08/2018   HCT 44.8 12/08/2018   MCV 96.4 12/08/2018   PLT 145.0 (L) 12/08/2018      Component Value Date/Time   NA 141 12/08/2018 0827   K 4.3 12/08/2018 0827   CL 110 12/08/2018 0827   CO2 22 12/08/2018 0827   GLUCOSE 90 12/08/2018 0827   BUN 11 12/08/2018 0827   CREATININE 0.82 12/08/2018 0827   CALCIUM 8.9 12/08/2018 0827   PROT 6.9 11/18/2018 1651   ALBUMIN 4.5 11/18/2018 1651   AST 12 11/18/2018 1651   ALT 9 11/18/2018 1651   ALKPHOS 75 11/18/2018 1651   BILITOT 0.2 11/18/2018 1651   GFRNONAA >60 12/19/2017 1016   GFRAA >60 12/19/2017 1016   Lab Results  Component Value Date   CHOL 180 07/17/2018   HDL 33.00 (L) 07/17/2018   LDLCALC 127 (H) 07/17/2018   TRIG 98.0 07/17/2018   CHOLHDL 5 07/17/2018   No results found for: HGBA1C Lab Results  Component Value Date   VITAMINB12 183 (L) 07/17/2018   Lab Results  Component Value Date   TSH 0.84 07/17/2018    ASSESSMENT AND PLAN 52 y.o. year old male  has a past medical history of Arthritis, Dizziness, ETOH abuse, Hard of hearing, Headache, and Hyperlipidemia. here with:  1. Dizziness 2. Chronic headache with migraine features -likely multifactorial, chronic migraine with underlying anxiety disorder -Overall, dizzy spells improved, less frequent and severe -Continue Depakote ER 500 mg at bedtime -the nortriptyline was helpful, but had dreams, may try Effexor in the future -Try to keep a journal of when episodes occur, associated symptoms -He reported a mild dizzy spell while in the office, was very brief, no associated symptoms, no  headache, heart rhythm was regular, 75 bpm -I will order 7-day cardiac monitor study to rule out arrhythmia, given his age, sudden onset of dizziness -EEG was normal September 2019 -MRI of the brain July 2019, was normal, chronic right middle ear and  mastoid inflammatory changes -CTA head and neck: No significant stenosis -He will follow-up in 6 months or sooner if needed  I spent 15 minutes with the patient. 50% of this time was spent discussing his plan of care.   Butler Denmark, AGNP-C, DNP 12/23/2018, 8:29 AM [facility] [address] [place], [place] [address] [phone]

## 2018-12-23 ENCOUNTER — Other Ambulatory Visit: Payer: Self-pay

## 2018-12-23 ENCOUNTER — Ambulatory Visit: Payer: BC Managed Care – PPO | Admitting: Neurology

## 2018-12-23 ENCOUNTER — Telehealth: Payer: Self-pay | Admitting: Radiology

## 2018-12-23 ENCOUNTER — Encounter: Payer: Self-pay | Admitting: Neurology

## 2018-12-23 VITALS — BP 106/66 | HR 75 | Temp 98.4°F | Ht 74.0 in | Wt 176.2 lb

## 2018-12-23 DIAGNOSIS — R42 Dizziness and giddiness: Secondary | ICD-10-CM

## 2018-12-23 DIAGNOSIS — G43709 Chronic migraine without aura, not intractable, without status migrainosus: Secondary | ICD-10-CM | POA: Diagnosis not present

## 2018-12-23 DIAGNOSIS — IMO0002 Reserved for concepts with insufficient information to code with codable children: Secondary | ICD-10-CM

## 2018-12-23 NOTE — Telephone Encounter (Signed)
Enrolled patient for a 30 day Event monitor to be mailed. Brief instructions were gone over with the patient and he knows to expect the monitor to arrive in 3-4 days.  *monitor was only ordered for 7 days. Patient asked to have it for the full 30 days to make sure he captures his events. He was instructed to mail back early if he has captured a few events.

## 2018-12-23 NOTE — Patient Instructions (Signed)
Continue Depakote 500 mg ER, try to keep journal of when episodes occur and your associated symptoms

## 2018-12-24 NOTE — Progress Notes (Signed)
I have reviewed and agreed above plan. 

## 2018-12-27 ENCOUNTER — Ambulatory Visit (INDEPENDENT_AMBULATORY_CARE_PROVIDER_SITE_OTHER): Payer: BC Managed Care – PPO

## 2018-12-27 DIAGNOSIS — R42 Dizziness and giddiness: Secondary | ICD-10-CM | POA: Diagnosis not present

## 2019-01-12 ENCOUNTER — Telehealth: Payer: Self-pay | Admitting: Neurology

## 2019-01-12 NOTE — Telephone Encounter (Signed)
Called pt and relayed that [facility] # [phone].  He will call them.

## 2019-01-12 NOTE — Telephone Encounter (Signed)
Pt states he is need of 2 of the strips to be used with the devise that is monitoring his activity.  Pt states some for his chest came defective.  Please call

## 2019-01-13 NOTE — Telephone Encounter (Signed)
Spoke to patient he is almost out of patches. He has been instructed to call the company and they will send him some more.

## 2019-01-13 NOTE — Telephone Encounter (Signed)
New message    Patient states he has is having an issue with his event monitor. Please call the patient to discuss.

## 2019-02-03 ENCOUNTER — Other Ambulatory Visit: Payer: Self-pay

## 2019-02-03 ENCOUNTER — Other Ambulatory Visit: Payer: Self-pay | Admitting: Neurology

## 2019-02-03 DIAGNOSIS — R42 Dizziness and giddiness: Secondary | ICD-10-CM

## 2019-02-03 NOTE — Progress Notes (Signed)
I placed a referral for cardiology for continued dizziness, fluctuating heart rate.   Cardiac Event Monitor: Impression:   Sinus bradycardia, normal sinus rhythm, sinus tachycardia. The average heart rate is 76bpm with a range of 46 to 136bpm.   Wide complex tachycardia up to 9 beats.

## 2019-02-05 ENCOUNTER — Telehealth: Payer: Self-pay | Admitting: Neurology

## 2019-02-05 MED ORDER — VENLAFAXINE HCL ER 37.5 MG PO CP24: 37.5000 mg | ORAL_CAPSULE | Freq: Every day | ORAL | 5 refills | Status: DC

## 2019-02-05 NOTE — Telephone Encounter (Signed)
Pt states that the medication that he is on is still causing him to not sleep and not feel well. Please call back to advise.

## 2019-02-05 NOTE — Addendum Note (Signed)
Addended by: Suzzanne Cloud on: 02/05/2019 11:23 AM   Modules accepted: Orders

## 2019-02-05 NOTE — Telephone Encounter (Signed)
It is okay to try Effexor XR 37.5 mg daily, may give him Maxalt dissolvable as needed for dizziness spells and headaches.

## 2019-02-05 NOTE — Telephone Encounter (Signed)
I called pt and he states the depakote is causing him bad dreams and keeping him up at night as well.  Dizzy spells come and go (still same).  Has an cardiology appt [facility].  (he stated that topamax cused acid reflux?, blood in urine, and nortiptyline- bad dreams.  Change to effexor?  (from last note.  Please advise.

## 2019-02-05 NOTE — Telephone Encounter (Signed)
I called the patient.  He reports side effect of bad dreams, being unable to sleep while taking Depakote.  Over the course of his evaluation, we have tried several different medications including Topamax, nortriptyline, Zonegran, and Depakote.  He had side effects to all the medication.  He says since taking medication treatment for dizziness, the episodes have improved and have become less frequent.  We will try Effexor 37.5 mg daily. He has Maxalt 5 mg, he has been taking and it has been helpful when bad dizziness episodes occur. I sent the rx , he will call for dose adjustment.

## 2019-02-13 ENCOUNTER — Other Ambulatory Visit: Payer: Self-pay | Admitting: Neurology

## 2019-03-02 ENCOUNTER — Encounter: Payer: Self-pay | Admitting: Neurology

## 2019-03-02 ENCOUNTER — Telehealth: Payer: Self-pay | Admitting: Neurology

## 2019-03-02 MED ORDER — AIMOVIG 70 MG/ML ~~LOC~~ SOAJ: 70.0000 mg | SUBCUTANEOUS | 5 refills | Status: DC

## 2019-03-02 MED ORDER — DEXAMETHASONE 2 MG PO TABS: ORAL_TABLET | ORAL | 0 refills | Status: DC

## 2019-03-02 NOTE — Telephone Encounter (Signed)
Pt states he had a bad dizzy spell Saturday at work and it started a headache that he still has today. Pt would like to know what he can take that wont interfere with his other meds so that he can get rid of this headache. Please advise.

## 2019-03-02 NOTE — Telephone Encounter (Signed)
I called pt and he states that he started with dizziness on Saturday , took maxalt which helped the dizzness, but the headache has been ongoing since then.  Level 10, Pain back of head on left side to left eyelid area.  Has taken motrin has not helped.  He is well hydrated.  Effexor not helping.  Please advise.

## 2019-03-02 NOTE — Telephone Encounter (Signed)
I called the patient, he reports Saturday he had dizziness, since then left sided headache. He took Maxalt, ibuprofen. He reports he has daily headache.  He reports the Effexor has not been beneficial, has given him bad dreams.  He indicates he initially came for evaluation of dizziness, developed more frequent headache with trial of several of her medications.  At this point, we will start Aimovig, with the thought that his dizziness is an aura type event preceding his migraines.  He is pending referral to cardiology.  I will send in a 2 mg, 3-day Decadron taper to break the cycle of prolonged headache.  He will stop Effexor.  He says for the last month he has had daily headache.  He has tried nortriptyline, Topamax, Zonegran, Depakote, Effexor.

## 2019-03-03 ENCOUNTER — Telehealth: Payer: Self-pay | Admitting: Neurology

## 2019-03-03 NOTE — Telephone Encounter (Signed)
Patient is scheduled this Friday for cardiology with Dr. Johnsie Cancel

## 2019-03-03 NOTE — Progress Notes (Signed)
CARDIOLOGY CONSULT NOTE       Patient ID: Kenneth Gilbert MRN: [id number] DOB/AGE: 15-Dec-1965 53 y.o.  Admit date: (Not on file) Referring Physician: Maryjean Ka Neuro  Primary Physician: Janith Lima, MD Primary Cardiologist: New Reason for Consultation: Dizziness   Active Problems:   * No active hospital problems. *   HPI:  53 y.o. history of HLD, smoking ETOH abuse hearing loss headache and dizziness. Seen by [facility] Dr Donnetta Hutching for no occlusive vertebral/carotid disease confirmed by CTA August 2019 Also had CT with Chronic appearing right middle ear and mastoid inflammatory changes.   Event monitor showed SR with infrequent NSVT 9 beats maximum no long pauses and no correlation with symptoms Average HR 76 bpm with range 46-136 bpm All events with skip beats were auto triggered non were related to symptoms All symptoms particularly dizziness noted NSR as rhythm at time NSVT more AIVR with rates less than 100   Frequent headaches Rx with motrin effexor and maxalt  Started on Aimovig ? Dizziness and aura type event preceding migraines Also started on decadron  Been tried on nortryptyline, Topamax, Zonegran depakote and Effexor  Dizziness is chronic since 2017 not related to positional changes Wears hearing aid on left Gets spinning sensation at times as well Seen by Dr Rachell Cipro at [facility] wo felt he had no peripheral or central vestibular dysfunction Normal EEG 01/2018 ? 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 Arthritis   . Dizziness   . ETOH abuse   . Hard of hearing   . Headache   . Hyperlipidemia     Family History  Problem Relation Age of Onset  . Cancer Father        unknown type  .  Cirrhosis Sister   . Heart disease Mother   . Cancer Brother     Social History   Socioeconomic History  . Marital status: Divorced    Spouse name: Not on file  . Number of children: 4  . Years of education: 75  . Highest education level: High school graduate  Occupational History  . Occupation: Sales executive, oil changes, rotates tires  Social Needs  . Financial resource strain: Not on file  . Food insecurity    Worry: Not on file    Inability: Not on file  . Transportation needs    Medical: Not on file    Non-medical: Not on file  Tobacco Use  . Smoking status: Current Every Day Smoker    Packs/day: 1.00    Types: Cigarettes    Start date: 06/11/1978  . Smokeless tobacco: Never Used  Substance and Sexual Activity  . Alcohol use: No    Frequency: Never    Comment: 01/09/18 - reports no use in 3 years  . Drug use: Yes    Types: Marijuana    Comment: 01/09/18 - uses marijuana occasionally  . Sexual activity: Yes    Partners: Female  Lifestyle  . Physical activity    Days per week: Not on file    Minutes per session: Not on file  . Stress: Not on file  Relationships  . Social connections    Talks  on phone: Not on file    Gets together: Not on file    Attends religious service: Not on file    Active member of club or organization: Not on file    Attends meetings of clubs or organizations: Not on file    Relationship status: Not on file  . Intimate partner violence    Fear of current or ex partner: Not on file    Emotionally abused: Not on file    Physically abused: Not on file    Forced sexual activity: Not on file  Other Topics Concern  . Not on file  Social History Narrative   Lives at home with his son.   Caffeine use:  Drinks 3-4 cans of [place] tea (20oz cans)   Right-handed.    Past Surgical History:  Procedure Laterality Date  . arm surgery Right   . arm surgery Left   . INNER EAR SURGERY     as a child  . SHOULDER SURGERY Left          Physical Exam: Blood pressure 110/62, pulse 91, height 6\' 2"  (1.88 m), weight 186 lb (84.4 kg), SpO2 98 %.   Affect appropriate Chronically ill white male  HEENT: normal Neck supple with no adenopathy JVP normal left bruits no thyromegaly Lungs clear with no wheezing and good diaphragmatic motion Heart:  S1/S2 no murmur, no rub, gallop or click PMI normal Abdomen: benighn, BS positve, no tenderness, no AAA no bruit.  No HSM or HJR Distal pulses intact with no bruits No edema Neuro non-focal Skin warm and dry No muscular weakness   Labs:   Lab Results  Component Value Date   WBC 6.3 12/08/2018   HGB 15.2 12/08/2018   HCT 44.8 12/08/2018   MCV 96.4 12/08/2018   PLT 145.0 (L) 12/08/2018   No results for input(s): NA, K, CL, CO2, BUN, CREATININE, CALCIUM, PROT, BILITOT, ALKPHOS, ALT, AST, GLUCOSE in the last 168 hours.  Invalid input(s): LABALBU Lab Results  Component Value Date   CKTOTAL 110 12/07/2014   CKMB 1.7 07/01/2009   TROPONINI <0.03 04/11/2017    Lab Results  Component Value Date   CHOL 180 07/17/2018   CHOL 177 06/11/2017   CHOL  07/01/2009    179        ATP III CLASSIFICATION:  <200     mg/dL   Desirable  200-239  mg/dL   Borderline High  >=240    mg/dL   High          Lab Results  Component Value Date   HDL 33.00 (L) 07/17/2018   HDL 33.00 (L) 06/11/2017   HDL 33 (L) 07/01/2009   Lab Results  Component Value Date   LDLCALC 127 (H) 07/17/2018   LDLCALC 125 (H) 06/11/2017   LDLCALC (H) 07/01/2009    129        Total Cholesterol/HDL:CHD Risk Coronary Heart Disease Risk Table                     Men   Women  1/2 Average Risk   3.4   3.3  Average Risk       5.0   4.4  2 X Average Risk   9.6   7.1  3 X Average Risk  23.4   11.0        Use the calculated Patient Ratio above and the CHD Risk Table to determine the patient's CHD Risk.  ATP III CLASSIFICATION (LDL):  <100     mg/dL   Optimal  100-129  mg/dL   Near or Above                     Optimal  130-159  mg/dL   Borderline  160-189  mg/dL   High  >190     mg/dL   Very High   Lab Results  Component Value Date   TRIG 98.0 07/17/2018   TRIG 94.0 06/11/2017   TRIG 87 07/01/2009   Lab Results  Component Value Date   CHOLHDL 5 07/17/2018   CHOLHDL 5 06/11/2017   CHOLHDL 5.4 07/01/2009   No results found for: LDLDIRECT    Radiology: No results found.  EKG: August 2019 SR rate 71 Q V12 otherwise normal including intervals and QT   ASSESSMENT AND PLAN:   1. HLD:  Continue statin labs with primary  2. Dizziness:  Non cardiac no correlation with arrhythmia on monitor f/u echo to r/o DCM 3. Headaches:  F/u neurology trying different meds  4. Smoking counseled on cessation little motivation to quit  5. Left bruit:  Has been w/u with Dr Donnetta Hutching not obstructive f/u [facility]   F/U PRN if echo normal   Signed: Jenkins Rouge 03/06/2019, 10:42 AM

## 2019-03-06 ENCOUNTER — Encounter: Payer: Self-pay | Admitting: Cardiovascular Disease

## 2019-03-06 ENCOUNTER — Ambulatory Visit (INDEPENDENT_AMBULATORY_CARE_PROVIDER_SITE_OTHER): Payer: BC Managed Care – PPO | Admitting: Cardiovascular Disease

## 2019-03-06 ENCOUNTER — Other Ambulatory Visit: Payer: Self-pay

## 2019-03-06 VITALS — BP 110/62 | HR 91 | Ht 74.0 in | Wt 186.0 lb

## 2019-03-06 DIAGNOSIS — R42 Dizziness and giddiness: Secondary | ICD-10-CM

## 2019-03-06 DIAGNOSIS — R55 Syncope and collapse: Secondary | ICD-10-CM | POA: Diagnosis not present

## 2019-03-06 NOTE — Patient Instructions (Signed)
Medication Instructions:   *If you need a refill on your cardiac medications before your next appointment, please call your pharmacy*  Lab Work:  If you have labs (blood work) drawn today and your tests are completely normal, you will receive your results only by: [name] MyChart Message (if you have MyChart) OR . A paper copy in the mail If you have any lab test that is abnormal or we need to change your treatment, we will call you to review the results.  Testing/Procedures: Your physician has requested that you have an echocardiogram. Echocardiography is a painless test that uses sound waves to create images of your heart. It provides your doctor with information about the size and shape of your heart and how well your heart's chambers and valves are working. This procedure takes approximately one hour. There are no restrictions for this procedure.  Follow-Up: At [facility], you and your health needs are our priority.  As part of our continuing mission to provide you with exceptional heart care, we have created designated Provider Care Teams.  These Care Teams include your primary Cardiologist (physician) and Advanced Practice Providers (APPs -  Physician Assistants and Nurse Practitioners) who all work together to provide you with the care you need, when you need it.  Your next appointment:   as needed  The format for your next appointment:   In Person  Provider:   You may see Dr. Johnsie Cancel or one of the following Advanced Practice Providers on your designated Care Team:    Truitt Merle, NP  Cecilie Kicks, NP  Kathyrn Drown, NP

## 2019-03-10 ENCOUNTER — Telehealth: Payer: Self-pay

## 2019-03-10 NOTE — Telephone Encounter (Signed)
PA approve for Aimovig   Effective from 03/10/2019 through 06/07/2019.

## 2019-03-12 NOTE — Telephone Encounter (Signed)
Pt called in to check the status of his Aimovig, he stated the pharmacy is stating they dont have it

## 2019-03-12 NOTE — Telephone Encounter (Signed)
I called pt after speaking to pharmacy.  Needs PA.  Will do this, but pt maye be able to get this by enrolling using access card, He has BCBS.  He does not have computer.  Daughter to assist later today.  He still has dizziness today and not able to work yesterday or today.  I will try to get PA approval soon for him.  He verbalized understanding.

## 2019-03-12 NOTE — Telephone Encounter (Signed)
I called pt back.  He was approved on 03-10-19.  Pharmacy ran thru did go thru.  I called pt and let him know.

## 2019-03-17 ENCOUNTER — Ambulatory Visit (HOSPITAL_COMMUNITY): Payer: BC Managed Care – PPO | Attending: Cardiology

## 2019-03-17 ENCOUNTER — Other Ambulatory Visit: Payer: Self-pay

## 2019-03-17 DIAGNOSIS — R55 Syncope and collapse: Secondary | ICD-10-CM | POA: Diagnosis not present

## 2019-03-17 DIAGNOSIS — R42 Dizziness and giddiness: Secondary | ICD-10-CM

## 2019-03-18 ENCOUNTER — Telehealth: Payer: Self-pay

## 2019-03-18 NOTE — Telephone Encounter (Signed)
-----   Message from Josue Hector, MD sent at 03/18/2019  7:51 AM EST ----- Normal echo with good EF and no significant valvular heart disease

## 2019-03-18 NOTE — Telephone Encounter (Signed)
Called pt. No answer. Left message for pt to return call.  

## 2019-03-18 NOTE — Telephone Encounter (Signed)
Pt made aware. Voiced understanding.  

## 2019-04-14 ENCOUNTER — Other Ambulatory Visit: Payer: Self-pay | Admitting: Internal Medicine

## 2019-04-14 DIAGNOSIS — I700 Atherosclerosis of aorta: Secondary | ICD-10-CM

## 2019-04-14 DIAGNOSIS — E785 Hyperlipidemia, unspecified: Secondary | ICD-10-CM

## 2019-04-14 MED ORDER — ROSUVASTATIN CALCIUM 5 MG PO TABS: 5.0000 mg | ORAL_TABLET | Freq: Every day | ORAL | 1 refills | Status: DC

## 2019-04-16 ENCOUNTER — Telehealth: Payer: Self-pay | Admitting: Neurology

## 2019-04-16 DIAGNOSIS — R42 Dizziness and giddiness: Secondary | ICD-10-CM

## 2019-04-16 NOTE — Telephone Encounter (Signed)
I have referred him to [facility]NT for evaluation of dizziness

## 2019-04-16 NOTE — Telephone Encounter (Signed)
Pt called stating that he is still getting dizziness and is wanting to know if he can go ahead and get that second opinion that was mentioned to him. Please advise.

## 2019-04-16 NOTE — Telephone Encounter (Signed)
Patient has been notified about the current plan about being referred to [facility]NT for his dizziness. He is aware that his appt for February has been cancelled. He was very appreciative for the call. No other questions or concerns at this time.

## 2019-05-04 ENCOUNTER — Telehealth: Payer: Self-pay | Admitting: Neurology

## 2019-05-04 ENCOUNTER — Other Ambulatory Visit: Payer: Self-pay | Admitting: *Deleted

## 2019-05-04 MED ORDER — GABAPENTIN 300 MG PO CAPS: 300.0000 mg | ORAL_CAPSULE | Freq: Two times a day (BID) | ORAL | 3 refills | Status: DC

## 2019-05-04 NOTE — Telephone Encounter (Signed)
I believe we felt the dizziness and the migraines could be related.  He has tried a multitude of medications for migraines including Topamax, Zonegran, nortriptyline, Depakote, Effexor, Aimovig, Maxalt. Is he still taking the Maxalt. I could try gabapentin daily 300 mg BID for headaches, we could increase Aimovig 140 mg. I reviewed the note from Audiology, looks like waiting to determine if CT temporal bone is indicated, although they don't feel would be associated with headaches. For headaches offer gabapentin, will send in the rx if he agrees.

## 2019-05-04 NOTE — Addendum Note (Signed)
Addended by: Suzzanne Cloud on: 05/04/2019 04:40 PM   Modules accepted: Orders

## 2019-05-04 NOTE — Telephone Encounter (Signed)
I spoke to pt and he stated that the Lynden he feels like is not working at all.  I relayed that gabapentin 300mg  po bid is another medication that she is recommending to see if would help him.  I relayed that she will call it in if he was ok to try.  He stated he would try.  I told him to read SE from pharmacy he stated he would.

## 2019-05-04 NOTE — Telephone Encounter (Signed)
He had virtual visit with Peggye Ley, 04-30-19 in care everywhere.  Are we continuiing to see himf or migraine headaches?

## 2019-05-04 NOTE — Telephone Encounter (Signed)
Patient called to advise his shots are not working for him and has already missed a week of work and states he has an extremely bad headache. Patient would like to know what can be done. Please follow up.

## 2019-05-04 NOTE — Telephone Encounter (Signed)
I will send in gabapentin 300 mg twice daily.

## 2019-05-04 NOTE — Addendum Note (Signed)
Addended by: Brandon Melnick on: 05/04/2019 04:38 PM   Modules accepted: Orders

## 2019-05-04 NOTE — Telephone Encounter (Signed)
Pt requested a call back  to discuss more information

## 2019-06-18 ENCOUNTER — Telehealth: Payer: Self-pay

## 2019-06-18 NOTE — Telephone Encounter (Signed)
Pending renewal for Aimovig 70 mg Key: [id number] ICD 10 code: [id number]   Your information has been submitted to [employer]. [employer] [place] will review the request and fax you a determination directly, typically within 3 business days of your submission once all necessary information is received.  If [employer]  has not responded in 3 business days or if you have any questions about your submission, contact [employer] at [phone].

## 2019-06-22 NOTE — Telephone Encounter (Signed)
Aimovig has been approved through 06-16-2020. A copy of the approval letter has been faxed to the pharmacy. Confirmation fax has been received.

## 2019-06-25 ENCOUNTER — Ambulatory Visit: Payer: BC Managed Care – PPO | Admitting: Neurology

## 2019-07-09 ENCOUNTER — Telehealth: Payer: Self-pay | Admitting: Neurology

## 2019-07-09 ENCOUNTER — Telehealth: Payer: Self-pay | Admitting: Internal Medicine

## 2019-07-09 DIAGNOSIS — R42 Dizziness and giddiness: Secondary | ICD-10-CM

## 2019-07-09 NOTE — Telephone Encounter (Signed)
I have placed referral for [facility]

## 2019-07-09 NOTE — Telephone Encounter (Signed)
I called pt and relayed that referral placed for [facility].  (LMVM).

## 2019-07-09 NOTE — Telephone Encounter (Signed)
New message:   Patient is calling to see if a referral can be sent to [facility] for his dizziness. He states they have talked about this issue in previous appts with Dr. Ronnald Ramp.

## 2019-07-09 NOTE — Telephone Encounter (Signed)
Patient called requesting a CB in regards to his dizziness please FU

## 2019-07-09 NOTE — Telephone Encounter (Signed)
I took pts returned call.  He is having continued dizziness and missing work.  He states that the referral to [facility] (did not show any thing), cant find out what is causing dizziness.  (see care everywhere).  He would like referral to [facility].  He did place phone message to his pcp as well.  Please advise.

## 2019-07-13 NOTE — Telephone Encounter (Signed)
F/u   The patient called back verbalized he has never dealt with [facility] before.   The patient does not have any specific clinic in mind, first available.

## 2019-07-13 NOTE — Telephone Encounter (Signed)
LVM for pt to call back as soon as possible.   RE: need to know which clinic with [facility] patient would like to see (if there is a preference).

## 2019-07-15 ENCOUNTER — Other Ambulatory Visit: Payer: Self-pay | Admitting: Neurology

## 2019-07-15 MED ORDER — RIZATRIPTAN BENZOATE 5 MG PO TABS: 5.0000 mg | ORAL_TABLET | ORAL | 1 refills | Status: DC | PRN

## 2019-07-15 NOTE — Telephone Encounter (Signed)
Pt is needing a refill on his rizatriptan (MAXALT) 5 MG tablet sent in to the [facility] on [address]. Pt states he has just taken his last one.

## 2019-07-17 NOTE — Telephone Encounter (Signed)
Pt contacted and informed of PCP response, pt stated that he had neuro send in the referral.

## 2019-07-27 NOTE — Telephone Encounter (Signed)
Pt said he called [facility] and the referral has not been received.  Pt states the referral needs to be faxed to [phone], pt asking for a call to discuss(pt left a voicemail at 2:18p.m.)

## 2019-07-28 NOTE — Telephone Encounter (Signed)
Sent x 2 to [employer] Number [phone]- fax [phone]

## 2019-08-30 IMAGING — CT CT ABD-PELV W/ CM
2 of 5 series · 16 of 46 positions shown, 18 images · IV contrast (Isovue)
Comparison: 04/17/2010

CLINICAL DATA: Epigastric pain

EXAM:
CT ABDOMEN AND PELVIS WITH CONTRAST
TECHNIQUE: Multidetector CT imaging of the abdomen and pelvis was performed
using the standard protocol following bolus administration of
intravenous contrast.
CONTRAST:  100mL QUZ1FS-0OO IOPAMIDOL (QUZ1FS-0OO) INJECTION 61%

[Series 2: axial st · axial · 0.73mm/px · z∈[+760,+1145]mm · 13 of 89 slices shown, 15 images]
[im 6/89  soft-tissue]
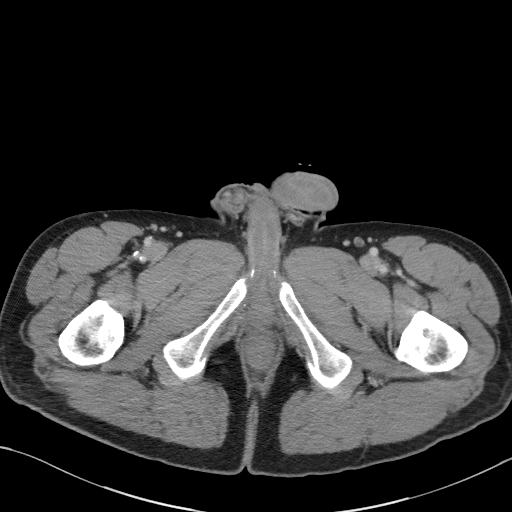
[im 6/89  bone]
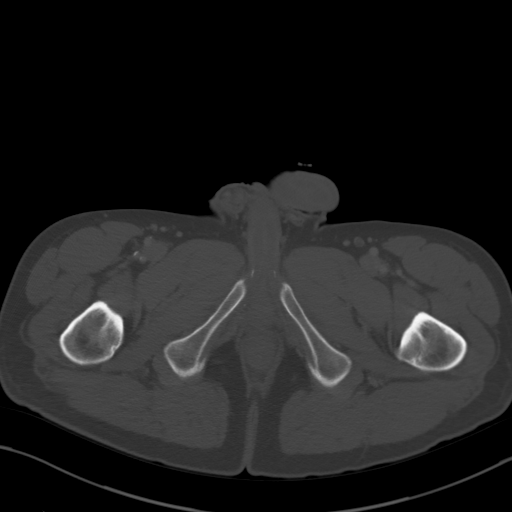
[im 11/89  soft-tissue]
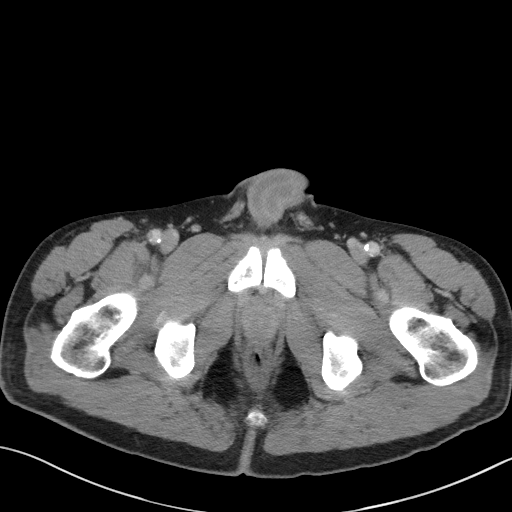
[im 21/89  soft-tissue]
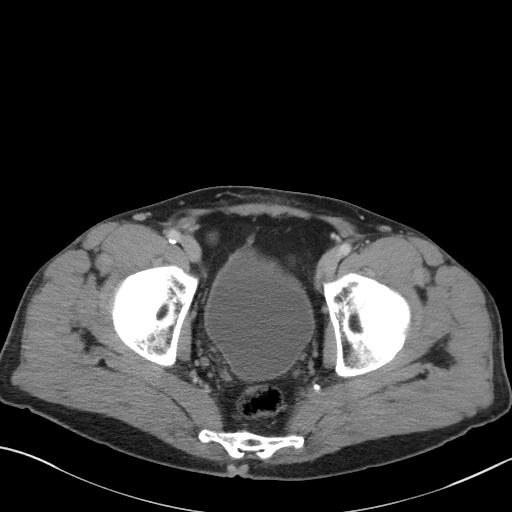
[im 26/89  soft-tissue]
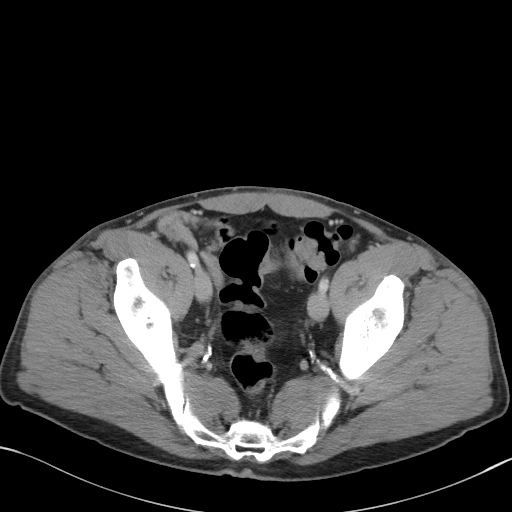
[im 32/89  soft-tissue]
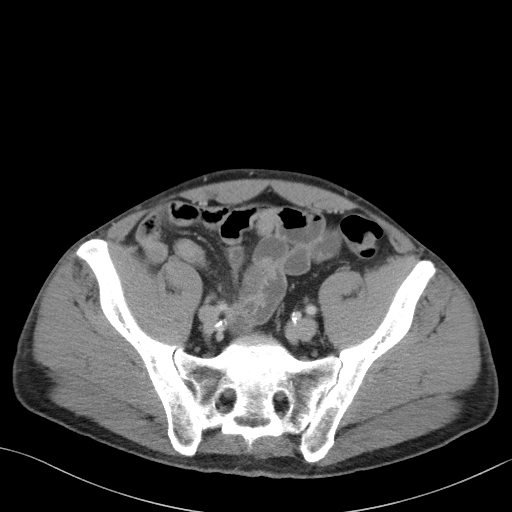
[im 37/89  soft-tissue]
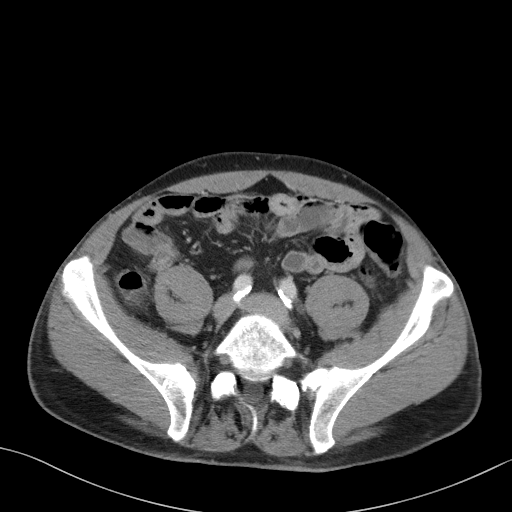
[im 47/89  soft-tissue]
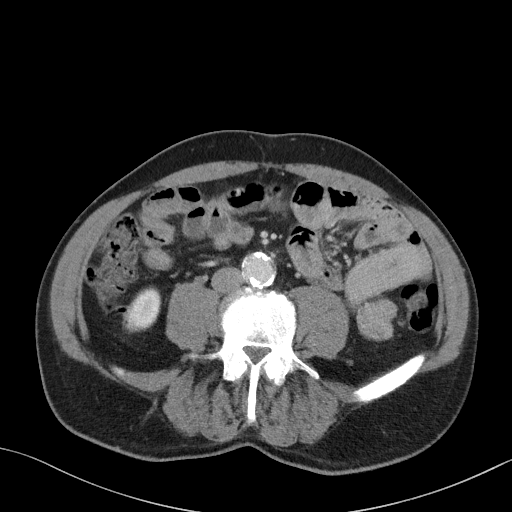
[im 52/89  soft-tissue]
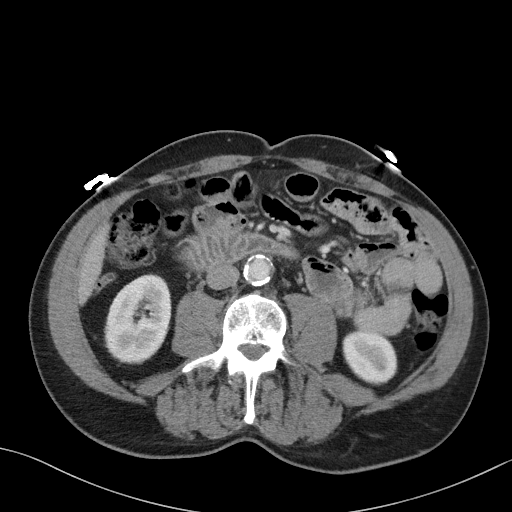
[im 57/89  soft-tissue]
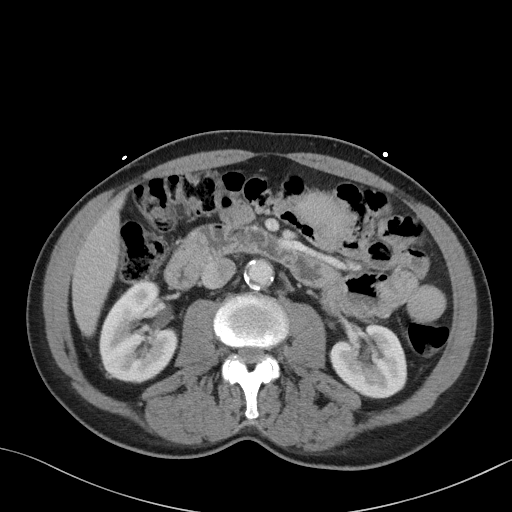
[im 57/89  bone]
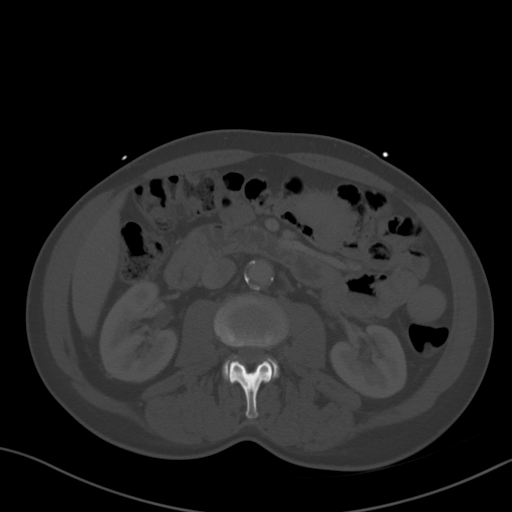
[im 63/89  soft-tissue]
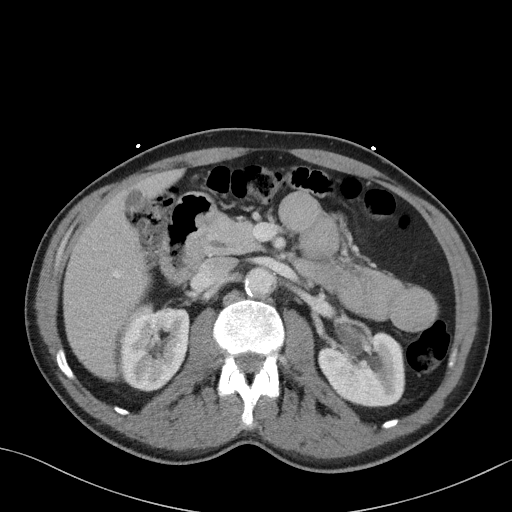
[im 68/89  soft-tissue]
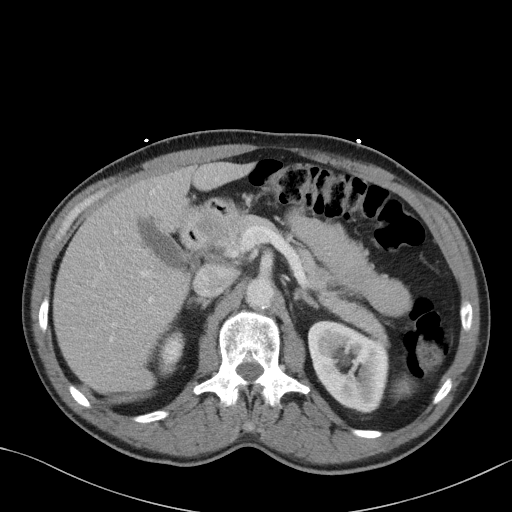
[im 78/89  soft-tissue]
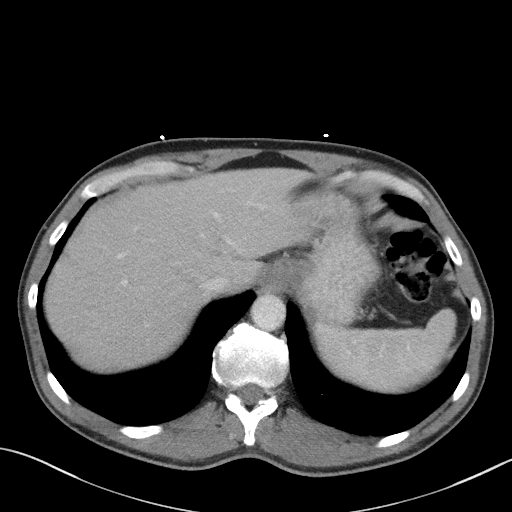
[im 83/89  soft-tissue]
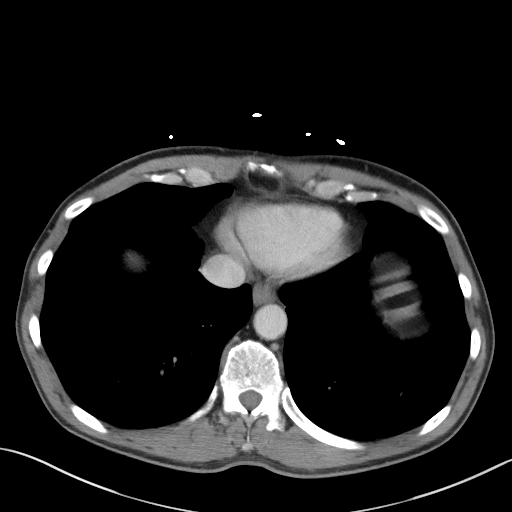

[Series 5: coronal st · coronal · 0.70mm/px · 3 of 92 slices shown]
[im 31/92  soft-tissue]
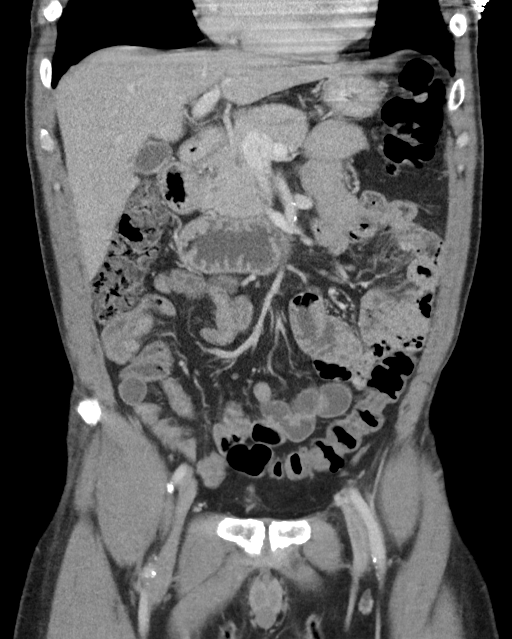
[im 41/92  soft-tissue]
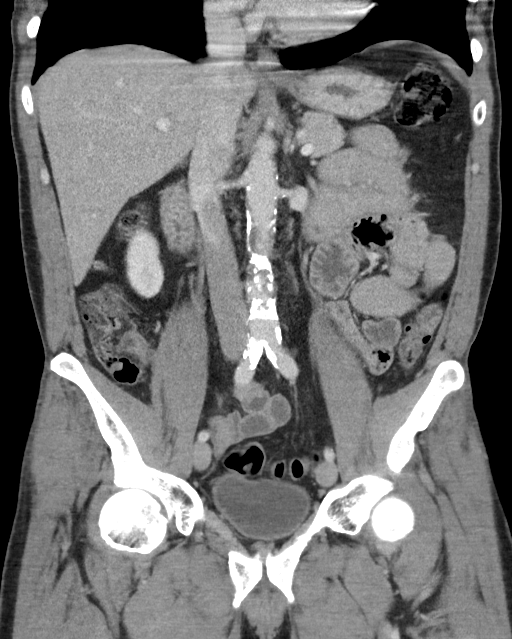
[im 51/92  soft-tissue]
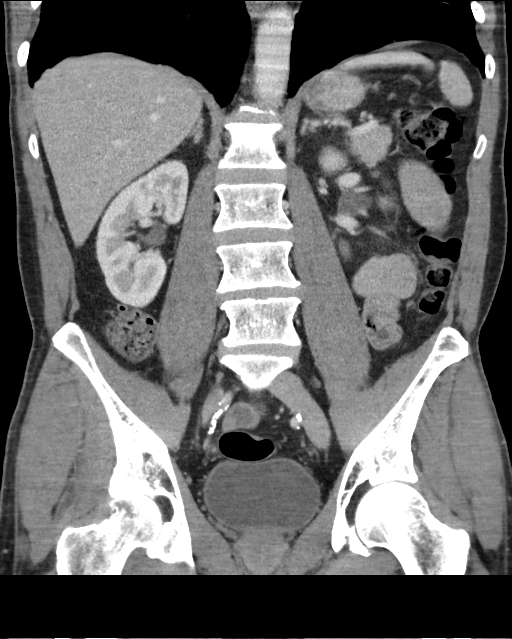

[16 of 46 positions shown; findings below may reference images not displayed]

FINDINGS: Lower chest: No acute abnormality.

Hepatobiliary: No focal liver abnormality is seen. No gallstones,
gallbladder wall thickening, or biliary dilatation.

Pancreas: Unremarkable. No pancreatic ductal dilatation or
surrounding inflammatory changes.

Spleen: Normal in size without focal abnormality.

Adrenals/Urinary Tract: Acute glands are within normal limits.
Stable prominent left extrarenal pelvis. Punctate nonobstructing
stone in the mid left kidney. The bladder is normal.

Stomach/Bowel: Stomach is within normal limits. Appendix appears
normal. No evidence of bowel wall thickening, distention, or
inflammatory changes.

Vascular/Lymphatic: Moderate atherosclerotic calcification. Ectatic
infrarenal abdominal aorta measuring up to 2.5 cm. No significantly
enlarged lymph nodes

Reproductive: Prostate is unremarkable.

Other: No abdominal wall hernia or abnormality. No abdominopelvic
ascites.

Musculoskeletal: No acute or suspicious lesion. Degenerative changes
.
IMPRESSION: 1. No CT evidence for acute intra-abdominal abnormality.
2. Ectatic infrarenal abdominal aorta up to 2.5 cm. Ectatic
abdominal aorta at risk for aneurysm development. Recommend followup
by ultrasound in 5 years. This recommendation follows ACR consensus
guidelines: White Paper of the ACR Incidental Findings Committee II
3. Punctate nonobstructing stone in the left kidney

## 2019-09-03 ENCOUNTER — Telehealth: Payer: Self-pay | Admitting: Neurology

## 2019-09-03 MED ORDER — GABAPENTIN 300 MG PO CAPS: 300.0000 mg | ORAL_CAPSULE | Freq: Two times a day (BID) | ORAL | 3 refills | Status: DC

## 2019-09-03 NOTE — Telephone Encounter (Signed)
Pt is needing a refill on his gabapentin (NEURONTIN) 300 MG capsule sent in to the [facility] on [address].

## 2019-09-08 DIAGNOSIS — H905 Unspecified sensorineural hearing loss: Secondary | ICD-10-CM | POA: Diagnosis not present

## 2019-09-08 DIAGNOSIS — G8929 Other chronic pain: Secondary | ICD-10-CM | POA: Diagnosis not present

## 2019-09-08 DIAGNOSIS — R519 Headache, unspecified: Secondary | ICD-10-CM | POA: Diagnosis not present

## 2019-09-08 DIAGNOSIS — R42 Dizziness and giddiness: Secondary | ICD-10-CM | POA: Diagnosis not present

## 2019-09-10 ENCOUNTER — Telehealth: Payer: Self-pay | Admitting: Neurology

## 2019-09-10 NOTE — Telephone Encounter (Signed)
I received a consultation report from [facility], he saw a neuro-ophthalmologist, Dr. Leodis Liverpool.  No evidence of neuro-ophthalmic dysfunction to explain his dizziness.  Given his persistent symptoms while tracking moving objects and associated hearing loss, he was referred for vestibular evaluation at [facility].  His eye exam was normal and not contributing to his dizziness.

## 2019-09-18 DIAGNOSIS — Z9889 Other specified postprocedural states: Secondary | ICD-10-CM | POA: Diagnosis not present

## 2019-09-18 DIAGNOSIS — H6123 Impacted cerumen, bilateral: Secondary | ICD-10-CM | POA: Diagnosis not present

## 2019-09-18 DIAGNOSIS — H7292 Unspecified perforation of tympanic membrane, left ear: Secondary | ICD-10-CM | POA: Diagnosis not present

## 2019-09-18 DIAGNOSIS — H6121 Impacted cerumen, right ear: Secondary | ICD-10-CM | POA: Diagnosis not present

## 2019-09-18 DIAGNOSIS — R42 Dizziness and giddiness: Secondary | ICD-10-CM | POA: Diagnosis not present

## 2019-10-07 ENCOUNTER — Telehealth: Payer: Self-pay | Admitting: Neurology

## 2019-10-07 DIAGNOSIS — R42 Dizziness and giddiness: Secondary | ICD-10-CM

## 2019-10-07 NOTE — Telephone Encounter (Signed)
Similar concerns, same issues since we saw him initially in August 2019, we have done extensive evaluations, including MRI of the brain, CT angiogram of head and neck, recent [facility] evaluation, ophthalmology evaluations failed to demonstrate etiology  Try different medications, antiepileptic medications, antidepression, migraine medications, without helping his symptoms at all  I do not think our clinic has anything more to offer, if you wish, will refer him to tertiary neurology for evaluation

## 2019-10-07 NOTE — Telephone Encounter (Signed)
Pt called stating that he has been experiencing dizziness and is wanting to discuss with provider. Please advise.

## 2019-10-07 NOTE — Telephone Encounter (Signed)
I called pt and he is c/o of dizziness,  (missing work because of this).  Has been to [facility], [facility] (negative findings).  Still trying to find out what is causing this.  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  I was not sure what to tell him, do I make appt?  Please advise.

## 2019-10-07 NOTE — Telephone Encounter (Signed)
Refer for 2nd neurology opinion at [facility] or [place].

## 2019-10-08 NOTE — Telephone Encounter (Signed)
Order for [facility] placed.

## 2019-10-08 NOTE — Telephone Encounter (Signed)
I called pt and relayed that per our practice we have done extensive testing referred to ENT, recommend to tertiary practice [facility] or [facility]).  We will still prescribe meds and then can discuss with them if they would and if not then see [name] once yearly for refills.  He verbalized understanding was appreciative. Will go to [facility].

## 2019-10-08 NOTE — Addendum Note (Signed)
Addended by: Suzzanne Cloud on: 10/08/2019 09:13 AM   Modules accepted: Orders

## 2019-10-09 ENCOUNTER — Emergency Department (HOSPITAL_COMMUNITY): Payer: BC Managed Care – PPO

## 2019-10-09 ENCOUNTER — Other Ambulatory Visit: Payer: Self-pay

## 2019-10-09 ENCOUNTER — Emergency Department (HOSPITAL_COMMUNITY)
Admission: EM | Admit: 2019-10-09 | Discharge: 2019-10-09 | Disposition: A | Discharge status: Home / Self Care | Payer: BC Managed Care – PPO | Attending: Emergency Medicine | Admitting: Emergency Medicine

## 2019-10-09 ENCOUNTER — Encounter (HOSPITAL_COMMUNITY): Payer: Self-pay | Admitting: Emergency Medicine

## 2019-10-09 DIAGNOSIS — F17210 Nicotine dependence, cigarettes, uncomplicated: Secondary | ICD-10-CM | POA: Diagnosis not present

## 2019-10-09 DIAGNOSIS — N132 Hydronephrosis with renal and ureteral calculous obstruction: Secondary | ICD-10-CM | POA: Diagnosis not present

## 2019-10-09 DIAGNOSIS — Z79899 Other long term (current) drug therapy: Secondary | ICD-10-CM | POA: Diagnosis not present

## 2019-10-09 DIAGNOSIS — R109 Unspecified abdominal pain: Secondary | ICD-10-CM | POA: Diagnosis not present

## 2019-10-09 DIAGNOSIS — N200 Calculus of kidney: Secondary | ICD-10-CM | POA: Diagnosis not present

## 2019-10-09 LAB — URINALYSIS, ROUTINE W REFLEX MICROSCOPIC
Bilirubin Urine: NEGATIVE
Glucose, UA: NEGATIVE mg/dL
Ketones, ur: 5 mg/dL — AB
Leukocytes,Ua: NEGATIVE
Nitrite: NEGATIVE
Protein, ur: 30 mg/dL — AB
RBC / HPF: >50 RBC/hpf — ABNORMAL HIGH (ref 0–5)
Specific Gravity, Urine: 1.024 (ref 1.005–1.030)
pH: 5 (ref 5.0–8.0)

## 2019-10-09 LAB — BASIC METABOLIC PANEL
Anion gap: 10 (ref 5–15)
BUN: 19 mg/dL (ref 6–20)
CO2: 21 mmol/L — ABNORMAL LOW (ref 22–32)
Calcium: 8.9 mg/dL (ref 8.9–10.3)
Chloride: 107 mmol/L (ref 98–111)
Creatinine, Ser: 1.3 mg/dL — ABNORMAL HIGH (ref 0.61–1.24)
GFR calc Af Amer: >60 mL/min (ref >60)
GFR calc non Af Amer: >60 mL/min (ref >60)
Glucose, Bld: 109 mg/dL — ABNORMAL HIGH (ref 70–99)
Potassium: 4.3 mmol/L (ref 3.5–5.1)
Sodium: 138 mmol/L (ref 135–145)

## 2019-10-09 LAB — CBC
HCT: 44.9 % (ref 39.0–52.0)
Hemoglobin: 15.4 g/dL (ref 13.0–17.0)
MCH: 31.6 pg (ref 26.0–34.0)
MCHC: 34.3 g/dL (ref 30.0–36.0)
MCV: 92 fL (ref 80.0–100.0)
Platelets: 169 10*3/uL (ref 150–400)
RBC: 4.88 MIL/uL (ref 4.22–5.81)
RDW: 12.5 % (ref 11.5–15.5)
WBC: 6.8 10*3/uL (ref 4.0–10.5)
nRBC: 0 % (ref 0.0–0.2)

## 2019-10-09 MED ORDER — PROMETHAZINE HCL 25 MG PO TABS
25.0000 mg | ORAL_TABLET | Freq: Four times a day (QID) | ORAL | 0 refills | Status: DC | PRN
Start: 2019-10-09 — End: 2019-11-03

## 2019-10-09 MED ORDER — KETOROLAC TROMETHAMINE 60 MG/2ML IM SOLN
60.0000 mg | Freq: Once | INTRAMUSCULAR | Status: AC
  Administered 2019-10-09: 60 mg via INTRAMUSCULAR
  Filled 2019-10-09: qty 2

## 2019-10-09 MED ORDER — ONDANSETRON 4 MG PO TBDP
4.0000 mg | ORAL_TABLET | Freq: Once | ORAL | Status: AC
  Administered 2019-10-09: 4 mg via ORAL
  Filled 2019-10-09: qty 1

## 2019-10-09 MED ORDER — OXYCODONE-ACETAMINOPHEN 5-325 MG PO TABS: 1.0000 | ORAL_TABLET | Freq: Four times a day (QID) | ORAL | 0 refills | Status: DC | PRN

## 2019-10-09 MED ORDER — TAMSULOSIN HCL 0.4 MG PO CAPS
0.4000 mg | ORAL_CAPSULE | Freq: Every day | ORAL | 0 refills | Status: AC
Start: 2019-10-09 — End: 2019-10-14

## 2019-10-09 MED ORDER — NAPROXEN 500 MG PO TABS
500.0000 mg | ORAL_TABLET | Freq: Two times a day (BID) | ORAL | 0 refills | Status: DC
Start: 2019-10-09 — End: 2019-11-03

## 2019-10-09 NOTE — ED Triage Notes (Signed)
Pt reports L flank pain and hematuria x2 days, reports hx of renal stones.

## 2019-10-09 NOTE — ED Notes (Signed)
Pt transported to CT ?

## 2019-10-09 NOTE — ED Notes (Signed)
Patient Alert and oriented to baseline. Stable and ambulatory to baseline. Patient verbalized understanding of the discharge instructions.  Patient belongings were taken by the patient.   

## 2019-10-09 NOTE — ED Notes (Signed)
EDP at the bedside.  ?

## 2019-10-09 NOTE — ED Provider Notes (Signed)
[facility] EMERGENCY DEPARTMENT Provider Note   CSN: [id number] Arrival date & time: 10/09/19  [id number]     History Chief Complaint  Patient presents with  . Flank Pain  . Hematuria    Kenneth Gilbert is a 54 y.o. male.  HPI   This patient is a 54 year old male, history of being hard of hearing, history of some alcohol abuse per the medical record, history of a vitamin B12 deficiency and a history of dizziness which he thinks is more of the vertigo and for which he has been prescribed gabapentin.  He also has a history of kidney stones, has had this in the past, over the last 2 days has developed hematuria with an associated left-sided abdominal and flank pain.  This is intermittent, nothing makes it better or worse, it is associated with nausea but no vomiting.  No medications given prehospital, states it was really bad last night but he did not want to get out of bed because of the pain.  He comes in this morning with ongoing intermittent symptoms.  Feels similar to prior exacerbations  Past Medical History:  Diagnosis Date  . Arthritis   . Dizziness   . ETOH abuse   . Hard of hearing   . Headache   . Hyperlipidemia     Patient Active Problem List   Diagnosis Date Noted  . Dark urine 11/18/2018  . Asymptomatic microscopic hematuria 11/18/2018  . Plantar fasciitis of left foot 07/17/2018  . Neuromyelopathy due to vitamin B12 deficiency ([place]) 07/17/2018  . Other chronic sinusitis 06/11/2018  . Dizziness 01/09/2018  . Chronic migraine 01/09/2018  . Vertebral artery stenosis 12/11/2017  . Sensorineural hearing loss (SNHL), bilateral 12/03/2017  . Mastoid disorder, right 10/28/2017  . Aortic atherosclerosis ([place]) 07/26/2017  . Aortic ectasia, abdominal ([place]) 07/26/2017  . Chronic vascular disorder of intestine ([place]) 06/12/2017  . Hyperlipidemia with target LDL less than 100 06/12/2017  . Routine general medical examination at a health care facility  06/11/2017  . Tobacco abuse 06/11/2017  . ACS (acute coronary syndrome) ([place]) 04/13/2013  . Calculus of kidney 02/20/2013  . GERD 07/12/2009    Past Surgical History:  Procedure Laterality Date  . arm surgery Right   . arm surgery Left   . INNER EAR SURGERY     as a child  . SHOULDER SURGERY Left        Family History  Problem Relation Age of Onset  . Cancer Father        unknown type  . Cirrhosis Sister   . Heart disease Mother   . Cancer Brother     Social History   Tobacco Use  . Smoking status: Current Every Day Smoker    Packs/day: 1.00    Types: Cigarettes    Start date: 06/11/1978  . Smokeless tobacco: Never Used  Substance Use Topics  . Alcohol use: No    Comment: 01/09/18 - reports no use in 3 years  . Drug use: Yes    Types: Marijuana    Comment: 01/09/18 - uses marijuana occasionally    Home Medications Prior to Admission medications   Medication Sig Start Date End Date Taking? Authorizing Provider  cyanocobalamin 2000 MCG tablet Take 1 tablet (2,000 mcg total) by mouth daily. 08/12/18   Janith Lima, MD  Erenumab-aooe (AIMOVIG) 70 MG/ML SOAJ Inject 70 mg into the skin every 30 (thirty) days. 03/02/19   Suzzanne Cloud, NP  gabapentin (NEURONTIN) 300 MG  capsule Take 1 capsule (300 mg total) by mouth 2 (two) times daily. 09/03/19   Suzzanne Cloud, NP  rizatriptan (MAXALT) 5 MG tablet Take 1 tablet (5 mg total) by mouth as needed for migraine. May repeat in 2 hours if needed. Max 2 in 24 hours. 07/15/19   Suzzanne Cloud, NP  rosuvastatin (CRESTOR) 5 MG tablet Take 1 tablet (5 mg total) by mouth daily. 04/14/19   Janith Lima, MD    Allergies    Codeine and Topamax [topiramate]  Review of Systems   Review of Systems  All other systems reviewed and are negative.   Physical Exam Updated Vital Signs BP 102/67 (BP Location: Right Arm)   Pulse 72   Temp 98.6 F (37 C) (Oral)   Resp 16   SpO2 95%   Physical Exam Vitals and nursing note reviewed.    Constitutional:      General: He is not in acute distress.    Appearance: He is well-developed.  HENT:     Head: Normocephalic and atraumatic.     Mouth/Throat:     Pharynx: No oropharyngeal exudate.  Eyes:     General: No scleral icterus.       Right eye: No discharge.        Left eye: No discharge.     Conjunctiva/sclera: Conjunctivae normal.     Pupils: Pupils are equal, round, and reactive to light.  Neck:     Thyroid: No thyromegaly.     Vascular: No JVD.  Cardiovascular:     Rate and Rhythm: Normal rate and regular rhythm.     Heart sounds: Normal heart sounds. No murmur. No friction rub. No gallop.   Pulmonary:     Effort: Pulmonary effort is normal. No respiratory distress.     Breath sounds: Normal breath sounds. No wheezing or rales.  Abdominal:     General: Bowel sounds are normal. There is no distension.     Palpations: Abdomen is soft. There is no mass.     Tenderness: There is no abdominal tenderness.     Comments: ttp over the LUQ and L flank, no other abd ttp  Musculoskeletal:        General: No tenderness. Normal range of motion.     Cervical back: Normal range of motion and neck supple.  Lymphadenopathy:     Cervical: No cervical adenopathy.  Skin:    General: Skin is warm and dry.     Findings: No erythema or rash.  Neurological:     Mental Status: He is alert.     Coordination: Coordination normal.     Comments: Hard of hearing - normal gait, speech and facial symmetry - symmetrical normal strength of all 4 extremities  Psychiatric:        Behavior: Behavior normal.     ED Results / Procedures / Treatments   Labs (all labs ordered are listed, but only abnormal results are displayed) Labs Reviewed  BASIC METABOLIC PANEL - Abnormal; Notable for the following components:      Result Value   CO2 21 (*)    Glucose, Bld 109 (*)    Creatinine, Ser 1.30 (*)    All other components within normal limits  CBC  URINALYSIS, ROUTINE W REFLEX MICROSCOPIC     EKG None  Radiology CT Renal Stone Study  Result Date: 10/09/2019 CLINICAL DATA:  53 year old male with history of left lower quadrant abdominal and flank pain. Gross hematuria. EXAM: CT  ABDOMEN AND PELVIS WITHOUT CONTRAST TECHNIQUE: Multidetector CT imaging of the abdomen and pelvis was performed following the standard protocol without IV contrast. COMPARISON:  CT the abdomen and pelvis 12/09/2018. FINDINGS: Lower chest: Aortic atherosclerosis. Hepatobiliary: No definite suspicious cystic or solid hepatic lesions are confidently identified on today's noncontrast CT examination. Unenhanced appearance of the gallbladder is normal. Pancreas: No definite pancreatic mass or peripancreatic fluid collections or inflammatory changes are noted on today's noncontrast CT examination. Spleen: Unremarkable. Adrenals/Urinary Tract: 4 mm calculus in the distal third of the left ureter (axial image 68 of series 3). This is associated with mild proximal left hydroureteronephrosis. There is also left extrarenal pelvis (normal anatomical variant) incidentally noted. Multiple additional nonobstructive calculi are noted within the collecting systems of the kidneys bilaterally, largest of which is in the lower pole collecting system of the left kidney measuring 7 mm. Unenhanced appearance of the kidneys and bilateral adrenal glands is otherwise unremarkable. Unenhanced appearance of the urinary bladder is normal. Stomach/Bowel: Unenhanced appearance of the stomach is normal. No pathologic dilatation of small bowel or colon. Normal appendix. Vascular/Lymphatic: Aortic atherosclerosis with ectasia of the infrarenal abdominal aorta which measures up to 2.8 cm in diameter. No lymphadenopathy noted in the abdomen or pelvis. Reproductive: Prostate gland and seminal vesicles are unremarkable in appearance. Other: No significant volume of ascites.  No pneumoperitoneum. Musculoskeletal: There are no aggressive appearing lytic or  blastic lesions noted in the visualized portions of the skeleton. IMPRESSION: 1. 4 mm calculus in the distal third of the left ureter which is associated with mild proximal left hydroureteronephrosis indicating obstruction. 2. Additional nonobstructive calculi are noted within the collecting systems of both kidneys measuring up to 7 mm in the lower pole collecting system of left kidney. 3. Aortic atherosclerosis with ectasia of the infrarenal abdominal aorta which measures up to 2.8 cm in diameter. Electronically Signed   By: Vinnie Langton M.D.   On: 10/09/2019 11:41    Procedures Procedures (including critical care time)  Medications Ordered in ED Medications  ketorolac (TORADOL) injection 60 mg (has no administration in time range)  ondansetron (ZOFRAN-ODT) disintegrating tablet 4 mg (has no administration in time range)    ED Course  I have reviewed the triage vital signs and the nursing notes.  Pertinent labs & imaging results that were available during my care of the patient were reviewed by me and considered in my medical decision making (see chart for details).    MDM Rules/Calculators/A&P                      Labs unremarkable Hematuria with flank pain suggests nephro or ureterolithiasis - at thia time will do CT renal protocol, UA and BMP with pain control and nausea control - toradol / zofran respectively,  PT agreeable to the plan.  He complains of a epigastric burning that he has when he takes neurontin - has been Rx by Neuro for his dizziness - seems to help somewhat with same - I referred him back to Neuro to deal with this side effect and to look for other options as he is not tolerating the medicine as well as he used to.  Kidney stone present at 42mm, distal 3rd. Stable appering, no UTI. Pt given meds here and for home.  Final Clinical Impression(s) / ED Diagnoses Final diagnoses:  Kidney stone on left side    Rx / DC Orders ED Discharge Orders    None  Noemi Chapel, MD 10/09/19 [phone]

## 2019-10-09 NOTE — Discharge Instructions (Signed)
Your exam and or your xrays have shown that you likely have a kidney stone.  You should follow up with the Urologist of your choosing or the Urologist listed above in the next 2-3 days if you have not passed the stone.  You should urinate in to the strainer until you pass the stone.    Flomax helps with passing the stone by opening up the Ureters (tubes), Vicodin and an antiinflammatory for pain if you are not allergic to these medicines.  Phenergan or Zofran for nausea.  Return to the ER for severe or worsening pain, vomiting or fevers or if you are unable to control your pain with the medicines provided.  Kidney Stones Kidney stones (ureteral lithiasis) are deposits that form inside your kidneys. The intense pain is caused by the stone moving through the urinary tract. When the stone moves, the ureter goes into spasm around the stone. The stone is usually passed in the urine.  CAUSES  A disorder that makes certain neck glands produce too much parathyroid hormone (primary hyperparathyroidism).  A buildup of uric acid crystals.  Narrowing (stricture) of the ureter.  A kidney obstruction present at birth (congenital obstruction).  Previous surgery on the kidney or ureters.  Numerous kidney infections.  SYMPTOMS  Feeling sick to your stomach (nauseous).  Throwing up (vomiting).  Blood in the urine (hematuria).  Pain that usually spreads (radiates) to the groin.  Frequency or urgency of urination.  DIAGNOSIS  Taking a history and physical exam.  Blood or urine tests.  Computerized X-ray scan (CT scan).  Occasionally, an examination of the inside of the urinary bladder (cystoscopy) is performed.  TREATMENT  Observation.  Increasing your fluid intake.  Surgery may be needed if you have severe pain or persistent obstruction.  The size, location, and chemical composition are all important variables that will determine the proper choice of action for you. Talk to your caregiver to better  understand your situation so that you will minimize the risk of injury to yourself and your kidney.  HOME CARE INSTRUCTIONS  Drink enough water and fluids to keep your urine clear or pale yellow.  Strain all urine through the provided strainer. Keep all particulate matter and stones for your caregiver to see. The stone causing the pain may be as small as a grain of salt. It is very important to use the strainer each and every time you pass your urine. The collection of your stone will allow your caregiver to analyze it and verify that a stone has actually passed.  Only take over-the-counter or prescription medicines for pain, discomfort, or fever as directed by your caregiver.  Make a follow-up appointment with your caregiver as directed.  Get follow-up X-rays if required. The absence of pain does not always mean that the stone has passed. It may have only stopped moving. If the urine remains completely obstructed, it can cause loss of kidney function or even complete destruction of the kidney. It is your responsibility to make sure X-rays and follow-ups are completed. Ultrasounds of the kidney can show blockages and the status of the kidney. Ultrasounds are not associated with any radiation and can be performed easily in a matter of minutes.  SEEK IMMEDIATE MEDICAL CARE IF:  Pain cannot be controlled with the prescribed medicine.  You have a fever.  The severity or intensity of pain increases over 18 hours and is not relieved by pain medicine.  You develop a new onset of abdominal pain.  You  feel faint or pass out.  MAKE SURE YOU:  Understand these instructions.  Will watch your condition.  Will get help right away if you are not doing well or get worse.  Document Released: 04/30/2005 Document Revised: 04/19/2011 Document Reviewed: 08/26/2009 [facility] Patient Information 2012 [place].  RESOURCE GUIDE  Chronic Pain Problems: Contact [facility]  [phone] Patients  need to be referred by their primary care doctor.  Insufficient Money for Medicine: Contact [employer] Way:  call "211" or [place] [phone].  No Primary Care Doctor: Call [facility]  [phone] - can help you locate a primary care doctor that  accepts your insurance, provides certain services, etc. Physician Referral Service- [phone]  Agencies that provide inexpensive medical care: [facility]  [facility]  [phone] [facility]  [phone] [facility]  [phone] [facility]  [phone] [facility]  [phone]  [place] Providers: [facility]- [address]  [phone], Mon-Fri 9am-7pm, Sat 9am-1pm [place], [address], [address] [place]  [place]- [address]  [place], [address], [phone]  Only accepts [place] patients after they have their name  applied to their card  Self Pay (no insurance) in [facility]: Sickle Cell Patients: Dr Kevan Ny, [facility]  [place], [facility]- [place]  [facility] [place]- [id number] [place], [facility]- see information above (Speak to [employer] if you do not have insurance)       -  [facility]- [place], [place],  [place], [place]  Dr Vista Lawman-  [address], [place], [place] [facility]- [address] [phone]       -  [facility]- [address], [place], also [address], [phone]        -    [facility]- [address], [place], 1st & 3rd Saturday   every month, 10am-1pm  1) Find a Doctor and Pay Out of Pocket Although you won't have to find out who is covered by your insurance plan, it is a good idea to ask around and get recommendations. You will then need to call the office and see if the doctor you have chosen will accept you as a new patient and what types of options they offer for patients who are self-pay. Some doctors offer discounts or will set up payment plans for their patients who do not have insurance, but you will need to ask so you aren't surprised when you get to your appointment.  2) Contact Your Local Health Department Not all health departments have doctors that can see patients for sick visits, but many do, so it is worth a call to see if yours does. If  you don't know where your local health department is, you can check in your phone book. The CDC also has a tool to help you locate your state's health department, and many state websites also have listings of all of their local health departments.  3) Find a [facility] [facility] If your illness is not likely to be very severe or complicated, you may want to try a walk in clinic. These are popping up all over the country in pharmacies, drugstores, and shopping centers. They're usually staffed by nurse practitioners or physician assistants that have been trained to treat common illnesses and complaints. They're usually fairly quick and inexpensive. However, if you have serious medical issues or chronic medical problems, these are probably not your best option  [facility] [place], [facility], [address] [place], phone [phone] or [phone].  Monday - Friday, call for an appointment. [place], [facility], [address], [place], phone [phone] or [phone].  Monday - Friday, call for an  appointment.  Abuse/Neglect: [place] [phone] [place] [phone] (After Hours)  Emergency Shelter:  [facility] [phone]  Maternity Homes: [facility] [phone] [place] [phone]  MRSA Hotline #:   (346)261-7658  [facility] Dept. [address]         [place] Phone:  [phone]                                  Phone:  [phone]                   Phone:  [phone]  [facility], [place]- [phone]       -     [facility] in [place], [address],                                  [place] [phone] or [phone] (After Hours)   [place]  Substance Abuse Resources: [facility] and Drug Services  [phone] [place] [phone] [facility] P[facility] [phone] Residential & Outpatient Substance Abuse Program  [phone]  Psychological Services: [place]  [phone] [place]  [place], [place]. [address], [place], [employer]: [phone] or [phone], [employer]  Dental Assistance  If unable to pay or uninsured, contact:  [facility] or [facility]. to become qualified for the adult dental clinic.  Patients  with Medicaid: [place] [phone] [address] [place], [place] [address] [phone]  If unable to pay, or uninsured, contact  [facility] [phone]) or [place] [phone] in [place], [place] in [place]) to become qualified for the adult dental clinic  Other [place]- [place], [place], [place], [address], [place], [place], 2nd and 4th Thursday of the month at 6:30am.  10 clients each day by appointment, can sometimes see walk-in patients if someone does not show for an appointment. [facility]- [address] Hillard Danker [place], [place], [address], [place], [place], [place], [address], [place] Department- [phone] [place] [facility] Department580 [phone]

## 2019-11-03 ENCOUNTER — Ambulatory Visit (INDEPENDENT_AMBULATORY_CARE_PROVIDER_SITE_OTHER): Payer: BC Managed Care – PPO | Admitting: Internal Medicine

## 2019-11-03 ENCOUNTER — Other Ambulatory Visit: Payer: Self-pay

## 2019-11-03 ENCOUNTER — Encounter: Payer: Self-pay | Admitting: Internal Medicine

## 2019-11-03 VITALS — BP 118/70 | HR 69 | Temp 97.6°F | Ht 74.0 in | Wt 170.0 lb

## 2019-11-03 DIAGNOSIS — N200 Calculus of kidney: Secondary | ICD-10-CM

## 2019-11-03 DIAGNOSIS — R059 Cough, unspecified: Secondary | ICD-10-CM

## 2019-11-03 DIAGNOSIS — E538 Deficiency of other specified B group vitamins: Secondary | ICD-10-CM | POA: Diagnosis not present

## 2019-11-03 DIAGNOSIS — G320 Subacute combined degeneration of spinal cord in diseases classified elsewhere: Secondary | ICD-10-CM | POA: Diagnosis not present

## 2019-11-03 DIAGNOSIS — Z0000 Encounter for general adult medical examination without abnormal findings: Secondary | ICD-10-CM | POA: Diagnosis not present

## 2019-11-03 DIAGNOSIS — Z720 Tobacco use: Secondary | ICD-10-CM

## 2019-11-03 DIAGNOSIS — E785 Hyperlipidemia, unspecified: Secondary | ICD-10-CM

## 2019-11-03 DIAGNOSIS — I700 Atherosclerosis of aorta: Secondary | ICD-10-CM

## 2019-11-03 DIAGNOSIS — I5189 Other ill-defined heart diseases: Secondary | ICD-10-CM

## 2019-11-03 DIAGNOSIS — R05 Cough: Secondary | ICD-10-CM

## 2019-11-03 DIAGNOSIS — I517 Cardiomegaly: Secondary | ICD-10-CM

## 2019-11-03 DIAGNOSIS — K219 Gastro-esophageal reflux disease without esophagitis: Secondary | ICD-10-CM

## 2019-11-03 LAB — LIPID PANEL
Cholesterol: 164 mg/dL (ref 0–200)
HDL: 34.5 mg/dL — ABNORMAL LOW (ref >39.00)
LDL Cholesterol: 112 mg/dL — ABNORMAL HIGH (ref 0–99)
NonHDL: 129.13
Total CHOL/HDL Ratio: 5
Triglycerides: 84 mg/dL (ref 0.0–149.0)
VLDL: 16.8 mg/dL (ref 0.0–40.0)

## 2019-11-03 LAB — PSA: PSA: 0.51 ng/mL (ref 0.10–4.00)

## 2019-11-03 LAB — TSH: TSH: 0.87 u[IU]/mL (ref 0.35–4.50)

## 2019-11-03 LAB — VITAMIN B12: Vitamin B-12: 280 pg/mL (ref 211–911)

## 2019-11-03 LAB — FOLATE: Folate: 9.3 ng/mL (ref >5.9)

## 2019-11-03 MED ORDER — ESOMEPRAZOLE MAGNESIUM 40 MG PO CPDR: 40.0000 mg | DELAYED_RELEASE_CAPSULE | Freq: Every day | ORAL | 1 refills | Status: DC

## 2019-11-03 MED ORDER — ROSUVASTATIN CALCIUM 5 MG PO TABS: 5.0000 mg | ORAL_TABLET | Freq: Every day | ORAL | 1 refills | Status: DC

## 2019-11-03 MED ORDER — ASPIRIN EC 81 MG PO TBEC: 81.0000 mg | DELAYED_RELEASE_TABLET | Freq: Every day | ORAL | 1 refills | Status: DC

## 2019-11-03 NOTE — Progress Notes (Signed)
Subjective:  Patient ID: Kenneth Gilbert, male    DOB: Jul 02, 1965  Age: 54 y.o. MRN: [id number]  CC: Annual Exam, Hyperlipidemia, and Gastroesophageal Reflux  This visit occurred during the SARS-CoV-2 public health emergency.  Safety protocols were in place, including screening questions prior to the visit, additional usage of staff PPE, and extensive cleaning of exam room while observing appropriate contact time as indicated for disinfecting solutions.    HPI Kenneth Gilbert presents for a CPX.  He complains of intermittent heartburn.  He is not currently taking anything to treat this.  He rarely takes ibuprofen.  Does not drink alcohol but smokes cigarettes.  He has a mild, intermittent nonproductive cough.  He denies chest pain, diaphoresis, hemoptysis, dyspnea on exertion, wheezing, or shortness of breath.  He was not aware that he had lost weight.  He was seen in the emergency room about a month ago for flank pain and hematuria.  He was found to have kidney stones.  It sounds like he has not followed up with a urologist yet.  Outpatient Medications Prior to Visit  Medication Sig Dispense Refill  . gabapentin (NEURONTIN) 300 MG capsule Take 1 capsule (300 mg total) by mouth 2 (two) times daily. 60 capsule 3  . rizatriptan (MAXALT) 5 MG tablet Take 1 tablet (5 mg total) by mouth as needed for migraine. May repeat in 2 hours if needed. Max 2 in 24 hours. 10 tablet 1  . cyanocobalamin 2000 MCG tablet Take 1 tablet (2,000 mcg total) by mouth daily. 90 tablet 1  . Erenumab-aooe (AIMOVIG) 70 MG/ML SOAJ Inject 70 mg into the skin every 30 (thirty) days. (Patient not taking: Reported on 10/09/2019) 1 pen 5  . ibuprofen (ADVIL) 200 MG tablet Take 400 mg by mouth every 6 (six) hours as needed for moderate pain.    . naproxen (NAPROSYN) 500 MG tablet Take 1 tablet (500 mg total) by mouth 2 (two) times daily with a meal. 30 tablet 0  . oxyCODONE-acetaminophen (PERCOCET) 5-325 MG tablet Take 1 tablet  by mouth every 6 (six) hours as needed. 10 tablet 0  . promethazine (PHENERGAN) 25 MG tablet Take 1 tablet (25 mg total) by mouth every 6 (six) hours as needed for nausea or vomiting. 12 tablet 0  . rosuvastatin (CRESTOR) 5 MG tablet Take 1 tablet (5 mg total) by mouth daily. (Patient not taking: Reported on 10/09/2019) 90 tablet 1   No facility-administered medications prior to visit.    ROS Review of Systems  Constitutional: Positive for unexpected weight change (wt loss). Negative for chills, diaphoresis, fatigue and fever.  HENT: Positive for hearing loss. Negative for trouble swallowing and voice change.   Eyes: Negative.  Negative for visual disturbance.  Respiratory: Positive for cough. Negative for chest tightness, shortness of breath and wheezing.   Cardiovascular: Negative for chest pain, palpitations and leg swelling.  Gastrointestinal: Negative for abdominal pain, constipation, diarrhea, nausea and vomiting.  Endocrine: Negative.   Genitourinary: Negative.  Negative for difficulty urinating, dysuria, hematuria, penile swelling, testicular pain and urgency.  Musculoskeletal: Negative.  Negative for arthralgias, back pain, myalgias and neck pain.  Skin: Negative.   Neurological: Positive for dizziness, numbness and headaches. Negative for seizures, syncope and weakness.       He has chronic intermittent headaches and dizziness that is managed by neurology.  Hematological: Negative for adenopathy. Does not bruise/bleed easily.  Psychiatric/Behavioral: Negative.     Objective:  BP 118/70 (BP Location: Left Arm, Patient  Position: Sitting, Cuff Size: Normal)   Pulse 69   Temp 97.6 F (36.4 C) (Oral)   Ht 6\' 2"  (1.88 m)   Wt 170 lb (77.1 kg)   SpO2 96%   BMI 21.83 kg/m   BP Readings from Last 3 Encounters:  11/03/19 118/70  10/09/19 100/68  03/06/19 110/62    Wt Readings from Last 3 Encounters:  11/03/19 170 lb (77.1 kg)  10/09/19 170 lb (77.1 kg)  03/06/19 186 lb  (84.4 kg)    Physical Exam Vitals reviewed.  Constitutional:      General: He is not in acute distress.    Appearance: Normal appearance. He is ill-appearing. He is not toxic-appearing or diaphoretic.  HENT:     Nose: Nose normal.     Mouth/Throat:     Mouth: Mucous membranes are moist.  Eyes:     General: No scleral icterus.    Conjunctiva/sclera: Conjunctivae normal.  Cardiovascular:     Rate and Rhythm: Normal rate and regular rhythm.     Heart sounds: No murmur heard.      Comments: EKG - NSR, 61 bpm Normal EKG Pulmonary:     Effort: Pulmonary effort is normal.     Breath sounds: No stridor. No wheezing, rhonchi or rales.  Abdominal:     General: Abdomen is flat. Bowel sounds are normal. There is no distension.     Palpations: Abdomen is soft. There is no hepatomegaly, splenomegaly or mass.     Tenderness: There is no abdominal tenderness.     Hernia: No hernia is present. There is no hernia in the left inguinal area or right inguinal area.  Genitourinary:    Pubic Area: No rash.      Penis: Normal and circumcised. No discharge, swelling or lesions.      Testes: Normal.        Right: Mass, tenderness or swelling not present.        Left: Mass, tenderness or swelling not present.     Epididymis:     Right: Normal.     Left: Normal.     Prostate: Normal. Not enlarged, not tender and no nodules present.     Rectum: Normal. Guaiac result negative. No mass, tenderness, anal fissure, external hemorrhoid or internal hemorrhoid. Normal anal tone.  Musculoskeletal:        General: No swelling. Normal range of motion.     Cervical back: Neck supple.     Right lower leg: No edema.     Left lower leg: No edema.  Lymphadenopathy:     Cervical: No cervical adenopathy.     Lower Body: No right inguinal adenopathy. No left inguinal adenopathy.  Skin:    General: Skin is warm and dry.     Coloration: Skin is not pale.  Neurological:     General: No focal deficit present.      Mental Status: He is alert and oriented to person, place, and time. Mental status is at baseline.     Lab Results  Component Value Date   WBC 6.8 10/09/2019   HGB 15.4 10/09/2019   HCT 44.9 10/09/2019   PLT 169 10/09/2019   GLUCOSE 109 (H) 10/09/2019   CHOL 164 11/03/2019   TRIG 84.0 11/03/2019   HDL 34.50 (L) 11/03/2019   LDLCALC 112 (H) 11/03/2019   ALT 9 11/18/2018   AST 12 11/18/2018   NA 138 10/09/2019   K 4.3 10/09/2019   CL 107 10/09/2019   CREATININE  1.30 (H) 10/09/2019   BUN 19 10/09/2019   CO2 21 (L) 10/09/2019   TSH 0.87 11/03/2019   PSA 0.51 11/03/2019   INR 0.97 07/01/2009    CT Renal Stone Study  Result Date: 10/09/2019 CLINICAL DATA:  54 year old male with history of left lower quadrant abdominal and flank pain. Gross hematuria. EXAM: CT ABDOMEN AND PELVIS WITHOUT CONTRAST TECHNIQUE: Multidetector CT imaging of the abdomen and pelvis was performed following the standard protocol without IV contrast. COMPARISON:  CT the abdomen and pelvis 12/09/2018. FINDINGS: Lower chest: Aortic atherosclerosis. Hepatobiliary: No definite suspicious cystic or solid hepatic lesions are confidently identified on today's noncontrast CT examination. Unenhanced appearance of the gallbladder is normal. Pancreas: No definite pancreatic mass or peripancreatic fluid collections or inflammatory changes are noted on today's noncontrast CT examination. Spleen: Unremarkable. Adrenals/Urinary Tract: 4 mm calculus in the distal third of the left ureter (axial image 68 of series 3). This is associated with mild proximal left hydroureteronephrosis. There is also left extrarenal pelvis (normal anatomical variant) incidentally noted. Multiple additional nonobstructive calculi are noted within the collecting systems of the kidneys bilaterally, largest of which is in the lower pole collecting system of the left kidney measuring 7 mm. Unenhanced appearance of the kidneys and bilateral adrenal glands is  otherwise unremarkable. Unenhanced appearance of the urinary bladder is normal. Stomach/Bowel: Unenhanced appearance of the stomach is normal. No pathologic dilatation of small bowel or colon. Normal appendix. Vascular/Lymphatic: Aortic atherosclerosis with ectasia of the infrarenal abdominal aorta which measures up to 2.8 cm in diameter. No lymphadenopathy noted in the abdomen or pelvis. Reproductive: Prostate gland and seminal vesicles are unremarkable in appearance. Other: No significant volume of ascites.  No pneumoperitoneum. Musculoskeletal: There are no aggressive appearing lytic or blastic lesions noted in the visualized portions of the skeleton. IMPRESSION: 1. 4 mm calculus in the distal third of the left ureter which is associated with mild proximal left hydroureteronephrosis indicating obstruction. 2. Additional nonobstructive calculi are noted within the collecting systems of both kidneys measuring up to 7 mm in the lower pole collecting system of left kidney. 3. Aortic atherosclerosis with ectasia of the infrarenal abdominal aorta which measures up to 2.8 cm in diameter. Electronically Signed   By: Vinnie Langton M.D.   On: 10/09/2019 11:41     Assessment & Plan:   Kenneth Gilbert was seen today for annual exam, hyperlipidemia and gastroesophageal reflux.  Diagnoses and all orders for this visit:  Aortic atherosclerosis ([place])- Will continue a statin and low-dose aspirin for cardiovascular risk reduction. -     rosuvastatin (CRESTOR) 5 MG tablet; Take 1 tablet (5 mg total) by mouth daily. -     aspirin EC 81 MG tablet; Take 1 tablet (81 mg total) by mouth daily.  Neuromyelopathy due to vitamin B12 deficiency ([place])- His B12 level is in the low normal range.  I have asked him to restart the high-dose oral B12 supplementation. -     Vitamin B12; Future -     Folate; Future -     Folate -     Vitamin B12 -     cyanocobalamin 2000 MCG tablet; Take 1 tablet (2,000 mcg total) by mouth  daily.  Hyperlipidemia with target LDL less than 100 - I have asked him to restart the statin for cardiovascular risk reduction. -     TSH; Future -     rosuvastatin (CRESTOR) 5 MG tablet; Take 1 tablet (5 mg total) by mouth daily. -  TSH  Routine general medical examination at a health care facility- Exam completed, labs reviewed, he refused a pneumonia vaccine, cancer screenings are up-to-date, patient education was given. -     Lipid panel; Future -     PSA; Future -     PSA -     Lipid panel  Calculus of kidney -     Ambulatory referral to Urology -     Basic metabolic panel; Future  Tobacco abuse -     Ambulatory Referral for Lung Cancer Scre  LVH (left ventricular hypertrophy)- His blood pressure is well controlled and he is asymptomatic with this. -     EKG 49-QPRF  Diastolic dysfunction without heart failure- He is asx and has normal volume status.  Cough- I recommended that he undergo a chest x-ray to screen for mass or infiltrate. -     DG Chest 2 View; Future  Gastroesophageal reflux disease without esophagitis- Irecommended he avoid NSAIDs and start taking a PPI. -     esomeprazole (NEXIUM) 40 MG capsule; Take 1 capsule (40 mg total) by mouth daily.   I have discontinued Kenneth Look. Woelfel "RAY"'s Aimovig, ibuprofen, naproxen, promethazine, and oxyCODONE-acetaminophen. I am also having him start on aspirin EC and esomeprazole. Additionally, I am having him maintain his rizatriptan, gabapentin, rosuvastatin, and cyanocobalamin.  Meds ordered this encounter  Medications  . rosuvastatin (CRESTOR) 5 MG tablet    Sig: Take 1 tablet (5 mg total) by mouth daily.    Dispense:  90 tablet    Refill:  1  . aspirin EC 81 MG tablet    Sig: Take 1 tablet (81 mg total) by mouth daily.    Dispense:  90 tablet    Refill:  1  . esomeprazole (NEXIUM) 40 MG capsule    Sig: Take 1 capsule (40 mg total) by mouth daily.    Dispense:  90 capsule    Refill:  1  . cyanocobalamin  2000 MCG tablet    Sig: Take 1 tablet (2,000 mcg total) by mouth daily.    Dispense:  90 tablet    Refill:  1   In addition to time spent on CPE, I spent 50 minutes in preparing to see the patient by review of recent labs, imaging and procedures, obtaining and reviewing separately obtained history, communicating with the patient and family or caregiver, ordering medications, tests or procedures, and documenting clinical information in the EHR including the differential Dx, treatment, and any further evaluation and other management of 1. Aortic atherosclerosis ([place]) 2. Neuromyelopathy due to vitamin B12 deficiency ([place]) 3. Hyperlipidemia with target LDL less than 100 4. Calculus of kidney 5. Tobacco abuse 6. LVH (left ventricular hypertrophy) 7. Diastolic dysfunction without heart failure 8. Cough 9. Gastroesophageal reflux disease without esophagitis     Follow-up: Return in about 6 months (around 05/04/2020).  [name], MD

## 2019-11-03 NOTE — Patient Instructions (Signed)

## 2019-11-04 MED ORDER — CYANOCOBALAMIN 2000 MCG PO TABS: 2000.0000 ug | ORAL_TABLET | Freq: Every day | ORAL | 1 refills | Status: DC

## 2019-11-05 ENCOUNTER — Other Ambulatory Visit: Payer: Self-pay | Admitting: *Deleted

## 2019-11-05 ENCOUNTER — Telehealth: Payer: Self-pay | Admitting: Internal Medicine

## 2019-11-05 DIAGNOSIS — F17210 Nicotine dependence, cigarettes, uncomplicated: Secondary | ICD-10-CM

## 2019-11-05 NOTE — Telephone Encounter (Signed)
Contacted pt and informed of EKG result. Pt informed that the urologist and pulmologist will call him directly.

## 2019-11-05 NOTE — Telephone Encounter (Signed)
New Message:    Pt is calling and would like a call in reference to his lab results. Pt also states something about a test that was supposed to be scheduled for him as well. Please advise.

## 2019-11-11 ENCOUNTER — Telehealth: Payer: Self-pay

## 2019-11-11 NOTE — Telephone Encounter (Signed)
New message   Pt c/o medication issue:  1. Name of Medication: gabapentin (NEURONTIN) 300 MG capsule  2. How are you currently taking this medication (dosage and times per day)? Two daily   3. Are you having a reaction (difficulty breathing--STAT)? No   4. What is your medication issue?  passing blood in urine this morning wants to know if the medicine he taken is causing this   5. Last office visit 6.22.21

## 2019-11-11 NOTE — Telephone Encounter (Signed)
Pt stated that he has notice that his urine color looks like there is blood in it. Pt wanted to know if the gabapentin would be causing this.   Please advise   Will also schedule pt for a follow up.

## 2019-11-11 NOTE — Telephone Encounter (Signed)
LVM for pt to call back as soon as possible.   

## 2019-11-23 ENCOUNTER — Ambulatory Visit
Admission: RE | Admit: 2019-11-23 | Discharge: 2019-11-23 | Disposition: A | Discharge status: Home / Self Care | Payer: BC Managed Care – PPO | Source: Ambulatory Visit | Attending: Acute Care | Admitting: Acute Care

## 2019-11-23 ENCOUNTER — Other Ambulatory Visit: Payer: Self-pay

## 2019-11-23 ENCOUNTER — Encounter: Payer: Self-pay | Admitting: Acute Care

## 2019-11-23 ENCOUNTER — Ambulatory Visit (INDEPENDENT_AMBULATORY_CARE_PROVIDER_SITE_OTHER): Payer: BC Managed Care – PPO | Admitting: Acute Care

## 2019-11-23 DIAGNOSIS — F17210 Nicotine dependence, cigarettes, uncomplicated: Secondary | ICD-10-CM

## 2019-11-23 DIAGNOSIS — Z122 Encounter for screening for malignant neoplasm of respiratory organs: Secondary | ICD-10-CM

## 2019-11-23 NOTE — Progress Notes (Signed)
Shared Decision Making Visit Lung Cancer Screening Program [phone])   Eligibility:  Age 54 y.o.  Pack Years Smoking History Calculation 50 pack year smoking hstory (# packs/per year x # years smoked)  Recent History of coughing up blood  no  Unexplained weight loss? no ( >Than 15 pounds within the last 6 months )  Prior History Lung / other cancer no (Diagnosis within the last 5 years already requiring surveillance chest CT Scans).  Smoking Status Current Smoker  Former Smokers: Years since quit: NA  Quit Date: NA  Visit Components:  Discussion included one or more decision making aids. yes  Discussion included risk/benefits of screening. yes  Discussion included potential follow up diagnostic testing for abnormal scans. yes  Discussion included meaning and risk of over diagnosis. yes  Discussion included meaning and risk of False Positives. yes  Discussion included meaning of total radiation exposure. yes  Counseling Included:  Importance of adherence to annual lung cancer LDCT screening. yes  Impact of comorbidities on ability to participate in the program. yes  Ability and willingness to under diagnostic treatment. yes  Smoking Cessation Counseling:  Current Smokers:   Discussed importance of smoking cessation. yes  Information about tobacco cessation classes and interventions provided to patient. yes  Patient provided with "ticket" for LDCT Scan. yes  Symptomatic Patient. no  Counseling  Diagnosis Code: Tobacco Use Z72.0  Asymptomatic Patient yes  Counseling (Intermediate counseling: > three minutes counseling) H2992  Former Smokers:   Discussed the importance of maintaining cigarette abstinence. yes  Diagnosis Code: Personal History of Nicotine Dependence. E26.834  Information about tobacco cessation classes and interventions provided to patient. Yes  Patient provided with "ticket" for LDCT Scan. yes  Written Order for Lung Cancer  Screening with LDCT placed in Epic. Yes (CT Chest Lung Cancer Screening Low Dose W/O CM) [id number] Z12.2-Screening of respiratory organs Z87.891-Personal history of nicotine dependence  I have spent 25 minutes of face to face time with Kenneth Gilbert discussing the risks and benefits of lung cancer screening. We viewed a power point together that explained in detail the above noted topics. We paused at intervals to allow for questions to be asked and answered to ensure understanding.We discussed that the single most powerful action that he can take to decrease his risk of developing lung cancer is to quit smoking. We discussed whether or not he is ready to commit to setting a quit date. We discussed options for tools to aid in quitting smoking including nicotine replacement therapy, non-nicotine medications, support groups, Quit Smart classes, and behavior modification. We discussed that often times setting smaller, more achievable goals, such as eliminating 1 cigarette a day for a week and then 2 cigarettes a day for a week can be helpful in slowly decreasing the number of cigarettes smoked. This allows for a sense of accomplishment as well as providing a clinical benefit. I gave him the " Be Stronger Than Your Excuses" card with contact information for community resources, classes, free nicotine replacement therapy, and access to mobile apps, text messaging, and on-line smoking cessation help. I have also given him my card and contact information in the event he needs to contact me. We discussed the time and location of the scan, and that either Doroteo Glassman RN or I will call with the results within 24-48 hours of receiving them. I have offered him  a copy of the power point we viewed  as a resource in the event they need reinforcement of  the concepts we discussed today in the office. The patient verbalized understanding of all of  the above and had no further questions upon leaving the office. They have my  contact information in the event they have any further questions.  I spent 3 minutes counseling on smoking cessation and the health risks of continued tobacco abuse.  I explained to the patient that there has been a high incidence of coronary artery disease noted on these exams. I explained that this is a non-gated exam therefore degree or severity cannot be determined. This patient is  Currently on statin therapy. I have asked the patient to follow-up with their PCP regarding any incidental finding of coronary artery disease and management with diet or medication as their PCP  feels is clinically indicated. The patient verbalized understanding of the above and had no further questions upon completion of the visit.  Pt. Would like an appointment with pharmacy for smoking cessation counseling    [name], NP 11/23/2019

## 2019-11-23 NOTE — Patient Instructions (Signed)
Thank you for participating in the [place] [facility] Screening Program. It was our pleasure to meet you today. We will call you with the results of your scan within the next few days. Your scan will be assigned a Lung RADS category score by the physicians reading the scans.  This Lung RADS score determines follow up scanning.  See below for description of categories, and follow up screening recommendations. We will be in touch to schedule your follow up screening annually or based on recommendations of our providers. We will fax a copy of your scan results to your Primary Care Physician, or the physician who referred you to the program, to ensure they have the results. Please call the office if you have any questions or concerns regarding your scanning experience or results.  Our office number is [phone]. Please speak with Denise Phelps, RN. She is our Lung Cancer Screening RN. If she is unavailable when you call, please have the office staff send her a message. She will return your call at her earliest convenience. Remember, if your scan is normal, we will scan you annually as long as you continue to meet the criteria for the program. (Age 55-77, Current smoker or smoker who has quit within the last 15 years). If you are a smoker, remember, quitting is the single most powerful action that you can take to decrease your risk of lung cancer and other pulmonary, breathing related problems. We know quitting is hard, and we are here to help.  Please let us know if there is anything we can do to help you meet your goal of quitting. If you are a former smoker, congratulations. We are proud of you! Remain smoke free! Remember you can refer friends or family members through the number above.  We will screen them to make sure they meet criteria for the program. Thank you for helping us take better care of you by participating in Lung Screening.  Lung RADS Categories:  Lung RADS 1: no nodules  or definitely non-concerning nodules.  Recommendation is for a repeat annual scan in 12 months.  Lung RADS 2:  nodules that are non-concerning in appearance and behavior with a very low likelihood of becoming an active cancer. Recommendation is for a repeat annual scan in 12 months.  Lung RADS 3: nodules that are probably non-concerning , includes nodules with a low likelihood of becoming an active cancer.  Recommendation is for a 6-month repeat screening scan. Often noted after an upper respiratory illness. We will be in touch to make sure you have no questions, and to schedule your 6-month scan.  Lung RADS 4 A: nodules with concerning findings, recommendation is most often for a follow up scan in 3 months or additional testing based on our provider's assessment of the scan. We will be in touch to make sure you have no questions and to schedule the recommended 3 month follow up scan.  Lung RADS 4 B:  indicates findings that are concerning. We will be in touch with you to schedule additional diagnostic testing based on our provider's  assessment of the scan.   

## 2019-11-24 ENCOUNTER — Other Ambulatory Visit: Payer: Self-pay | Admitting: *Deleted

## 2019-11-24 DIAGNOSIS — F17210 Nicotine dependence, cigarettes, uncomplicated: Secondary | ICD-10-CM

## 2019-11-24 NOTE — Progress Notes (Signed)
Please call patient and let them  know their  low dose Ct was read as a Lung RADS 1, negative study: no nodules or definitely benign nodules. Radiology recommendation is for a repeat LDCT in 12 months. .Please let them  know we will order and schedule their  annual screening scan for 11/2020. Please let them  know there was notation of CAD on their  scan.  Please remind the patient  that this is a non-gated exam therefore degree or severity of disease  cannot be determined. Please have them  follow up with their PCP regarding potential risk factor modification, dietary therapy or pharmacologic therapy if clinically indicated. Pt.  is  currently on statin therapy. Please place order for annual  screening scan for  11/2020 and fax results to PCP. Thanks so much.

## 2019-11-26 ENCOUNTER — Ambulatory Visit: Payer: BC Managed Care – PPO | Admitting: Pharmacist

## 2019-11-26 ENCOUNTER — Other Ambulatory Visit: Payer: Self-pay

## 2019-11-26 DIAGNOSIS — Z7189 Other specified counseling: Secondary | ICD-10-CM

## 2019-11-26 MED ORDER — CHANTIX STARTING MONTH PAK 0.5 MG X 11 & 1 MG X 42 PO TABS: ORAL_TABLET | ORAL | 0 refills | Status: DC

## 2019-11-26 MED ORDER — VARENICLINE TARTRATE 1 MG PO TABS: 1.0000 mg | ORAL_TABLET | Freq: Two times a day (BID) | ORAL | 1 refills | Status: DC

## 2019-11-26 NOTE — Patient Instructions (Signed)
Thank you for meeting with the pharmacy team today!  Below find a summary of what we discussed at your visit   I have sent the prescription for chantix. o On Days 1 to 3, you will take 0.5 mg once daily o On Days 4 to 7, you will take  0.5 mg twice daily o Then after Day 7, you will take 1 mg twice daily for up to 11 weeks. . You should plan to quite smoking 2 weeks after starting the chantix. o Quit Date: December 07, 2019 . 1-800-QUIT-NOW can also offer daily talk therapy or other help if you need.  Next pharmacy appointment August 9th at  [facility] [phone] with any questions or concerns.  Lorel Monaco, PharmD PGY2 Ambulatory Care Resident

## 2019-11-26 NOTE — Progress Notes (Signed)
   Subjective Patient presents to [facility] and seen by the pharmacist for smoking cessation counseling. He was referred by Eric Form, NP and was last seen in office on 11/23/19. Patient reports he is still smoking 1ppd. His last attempt was about 5 years ago and was able to be tobacco free for 2 years when using Chantix. Patient reports that he tolerated Chantix well with no side-effects. He has used nicotine patches in the past, however experienced cramping in his arms when placing the patch on his shoulder and chest (of note, has history of nerve pain in both arms that required surgery). His main trigger is his stressful work environment. He works at [employer] and most of his co-workers are also smokers.       Social History   Tobacco Use  Smoking Status Current Every Day Smoker  . Packs/day: 1.00  . Types: Cigarettes  . Start date: 06/11/1978  Smokeless Tobacco Never Used     Tobacco Use History  Age when started using tobacco on a daily basis was 77.  Type: cigarettes.  Number of cigarettes per day 20, brand: [employer].  Smokes first cigarette 15-30 minutes after waking.  Does not wake at night to smoke  Triggers include work.  Quit Attempt History   Most recent quit attempt was about 5 years ago.  Longest time ever been tobacco free: 2 years.  Methods tried in the past include Nicotine Patch and Chantix  Rates IMPORTANCE of quitting tobacco on 1-10 scale of 10.  Rates READINESS of quitting tobacco on 1-10 scale of 10.  Rates CONFIDENCE of quitting tobacco on 1-10 scale of 10.  Motivators to quitting include health; barriers include work Pharmacist, hospital History  Administered Date(s) Administered  . Hep A / Hep B 06/13/2017, 07/25/2017, 09/26/2017  . Tdap 06/11/2017     Assessment and Plan  1. Smoking Cessation:  Patient states he is ready to quit smoking. Patient set quit date of December 07, 2019. Reviewed STAR quit plan and discussed  smoking cessation agent Chantix. Patient is agreeable to start Chantix.   Varenicline Initiated varenicline titration of 0.5 mg by mouth once daily with food x3 days, then 0.5 mg by mouth twice daily with food x4 days, then 1 mg by mouth twice daily with food thereafter. CrCL greater than 30 mL/min. Patient counseled on purpose, proper use, and potential adverse effects, including GI upset, and potential change in mood.   Set goal to decrease by 3 cigarettes every week till next appointment and provided information on1 800-QUIT NOW support program.   Scheduled follow up appointment 12/21/19 @2 :20PM.  PLAN  DECREASE amount of cigarettes you smoke by 3 cigarettes every week.  Next appointment August 9th at 2:20 pm  2.   Medication Reconciliation A drug regimen assessment was performed, including review of allergies, interactions, disease-state management, dosing and immunization history. Medications were reviewed with the patient, including name, instructions, indication, goals of therapy, potential side effects, importance of adherence, and safe use.   Patient reports not taking aspirin, esomeprazole, rosuvastatin, and cyanocobalamin.   3.    Immunizations:  Unable to address at appointment due to time constraints. Will address at next visit.  This appointment required 60 minutes of patient care (this includes precharting, chart review, review of results, face-to-face care, etc.).  Lorel Monaco, PharmD PGY2 Ambulatory Care Resident [place]

## 2019-12-07 ENCOUNTER — Ambulatory Visit: Payer: BC Managed Care – PPO | Attending: Internal Medicine

## 2019-12-07 DIAGNOSIS — Z20822 Contact with and (suspected) exposure to covid-19: Secondary | ICD-10-CM

## 2019-12-08 ENCOUNTER — Telehealth: Payer: Self-pay

## 2019-12-08 LAB — SARS-COV-2, NAA 2 DAY TAT

## 2019-12-08 LAB — NOVEL CORONAVIRUS, NAA: SARS-CoV-2, NAA: DETECTED — AB

## 2019-12-08 NOTE — Telephone Encounter (Signed)
fyi

## 2019-12-08 NOTE — Telephone Encounter (Signed)
New message   Tested + for COVID wanted to let the MD know.

## 2019-12-09 NOTE — Telephone Encounter (Signed)
Tried to call pt, line picked up but I could not hear anything.   If pt calls back you may inform that it will be up to his employer as to what he needs in order to return to work.

## 2019-12-09 NOTE — Telephone Encounter (Signed)
F/u   Tested + COVID on 7.19.21 was sick a week prior.   Should he make an appt to see MD before going back to work next week?

## 2019-12-10 ENCOUNTER — Telehealth (INDEPENDENT_AMBULATORY_CARE_PROVIDER_SITE_OTHER): Payer: BC Managed Care – PPO | Admitting: Internal Medicine

## 2019-12-10 DIAGNOSIS — U071 COVID-19: Secondary | ICD-10-CM | POA: Diagnosis not present

## 2019-12-10 NOTE — Progress Notes (Signed)
Virtual Visit via Video Note  I connected with Kenneth Gilbert on 12/10/19 at  2:20 PM EDT by a video enabled telemedicine application and verified that I am speaking with the correct person using two identifiers.  The patient and the provider were at separate locations throughout the entire encounter. Patient location: home, Provider location: work   I discussed the limitations of evaluation and management by telemedicine and the availability of in person appointments. The patient expressed understanding and agreed to proceed. The patient and the provider were the only parties present for the visit unless noted in HPI below.  History of Present Illness: The patient is a 54 y.o. man with visit for covid-19 positive on 11/30/19. Started feeling unwell about 4-5 days prior with headache and drainage and cough. About 50% of his work is out sick with covid-19 and his work was closed this week. He wants to know if he is safe to return to work next week. Also wants to discuss if he is protected against further infection. Not vaccinated and no intention to do so. Denies SOB but has some cough. Does have chronic cough but it is slightly worse. Overall it is improving. Has tried nothing for symptoms.  Observations/Objective: Appearance: normal, some coughing during visit non-productive, breathing appears normal, casual grooming, abdomen does not appear distended, throat not well visualized, poor hearing with aids in place, mental status is A and O times 3  Assessment and Plan: See problem oriented charting  Follow Up Instructions: Okay to return to work on 12/14/19, continue with masking and social distancing. Advised covid-19 vaccine once well but patient declines this.   I discussed the assessment and treatment plan with the patient. The patient was provided an opportunity to ask questions and all were answered. The patient agreed with the plan and demonstrated an understanding of the instructions.   The  patient was advised to call back or seek an in-person evaluation if the symptoms worsen or if the condition fails to improve as anticipated.  Hoyt Koch, MD

## 2019-12-11 ENCOUNTER — Encounter: Payer: Self-pay | Admitting: Internal Medicine

## 2019-12-11 DIAGNOSIS — U071 COVID-19: Secondary | ICD-10-CM | POA: Insufficient documentation

## 2019-12-11 NOTE — Assessment & Plan Note (Signed)
Advised he will be 14 days out from positive test on 12/14/19 and assuming feeling well can return to work. Advised that the cough will likely linger given ongoing smoking. Advised of likely 3 month immunity give or take. Advised to get vaccinated but he declines this. Advised can get work note if needed.

## 2019-12-21 ENCOUNTER — Other Ambulatory Visit: Payer: BC Managed Care – PPO

## 2019-12-21 NOTE — Progress Notes (Unsigned)
Subjective Patient presents to [facility] and seen by the pharmacist for smoking cessation counseling. He was referred by Eric Form, NP and was last seen in office by the clinical pharmacist on 11/26/19.   Patient reports he is still smoking ***.   Assess readiness to quit If not ready to quit, set goals (Ex: stop smoking in car and/or house)  Last set goal - decrease by 3cigarettesevery week till next appointment   F/u Questions if started on therapy . # cigs per day (current) >> . Assess adherence/compliance and side effects with chantix 1 mg BID with food . Any new triggers and how are you handling current/new . Smoking in the house or car? . Ready to set a quit date if not already?? . If time permits, conduct Med rec   Assess Immunizations   F/u via phone >>  Social History   Tobacco Use  Smoking Status Current Every Day Smoker  . Packs/day: 1.00  . Types: Cigarettes  . Start date: 06/11/1978  Smokeless Tobacco Never Used     Tobacco Use History  Age when started using tobacco on a daily basis 8.  Type: cigarettes.  Number of cigarettes per day ***, brand [employer].  Smokes first cigarette 15-30 minutes after waking.  Does not wake at night to smoke  Triggers include work.  Quit Attempt History   Most recent quit attempt was about 5 years ago.  Longest time ever been tobacco free: 2 years when using Chantix  Methods tried in the past include Nicotine Patch and Chantix  Experienced cramping in his arms when placing the nicotine patch on his shoulder and chest (of note, has history of nerve pain in both arms that required surgery)  Rates IMPORTANCE of quitting tobacco on 1-10 scale of 10.  Rates READINESS of quitting tobacco on 1-10 scale of 10.  Rates CONFIDENCE of quitting tobacco on 1-10 scale of 10.  Motivators to quitting include health; barriers include work Pharmacist, hospital History  Administered Date(s)  Administered  . Hep A / Hep B 06/13/2017, 07/25/2017, 09/26/2017  . Tdap 06/11/2017     Assessment and Plan  1. Smoking Cessation   Patient states they are ready to quit smoking.  Patient set quit date of ***.   Discussed smoking cessation agent Chant, Wellbutrin, and nicotine replacement therapies.  Patient is agreeable to Chantix.   Varenicline Initiated varenicline titration of 0.5 mg by mouth once daily with food x3 days, then 0.5 mg by mouth twice daily with food x4 days, then 1 mg by mouth twice daily with food thereafter.  CrCL greater than 30 mL/min. Patient counseled on purpose, proper use, and potential adverse effects, including GI upset, and potential change in mood.   Provided information on 1 800-QUIT NOW support program.   Patient is not ready to quit smoking.  Reviewed STAR quit plan and smoking cessation agents Chantix, Wellbutrin, and nicotine replacement therapy.  Schedule follow up appointment ***.  Set goal to decrease by *** cigarettes every week till next appointment.  2. Medication Reconciliation A drug regimen assessment was performed, including review of allergies, interactions, disease-state management, dosing and immunization history. Medications were reviewed with the patient, including name, instructions, indication, goals of therapy, potential side effects, importance of adherence, and safe use.  3. Immunizations    This appointment required *** minutes of patient care (this includes precharting, chart review, review of results, face-to-face care, etc.).  Lorel Monaco, PharmD PGY2 Ambulatory  Care Resident [place]

## 2019-12-28 ENCOUNTER — Telehealth: Payer: Self-pay | Admitting: Pharmacist

## 2019-12-28 NOTE — Telephone Encounter (Signed)
Contacted patient to discuss smoking cessation progress. Patient reports that he was recently diagnosed with COVID about 3.5 weeks ago. During his 2 week COVID recovery period, he did not smoke any cigarettes. Now, he has decreased the number of cigarettes per day from 20 to 1. Congratulated him on this achievement. Patient reports that he has not started Chantix due to it being on back order with his pharmacy. Additionally, patient also reports "dizzy spells" that has been occurring for 2-3 years and that he has visited a neurologist for this. Patient works at [employer] and has a very busy schedule. Will try to call patient in 2 weeks to assess progress.   Lorel Monaco, PharmD PGY2 Ambulatory Care Resident [place]

## 2020-01-04 ENCOUNTER — Telehealth: Payer: Self-pay | Admitting: Neurology

## 2020-01-04 MED ORDER — GABAPENTIN 300 MG PO CAPS: 300.0000 mg | ORAL_CAPSULE | Freq: Two times a day (BID) | ORAL | 0 refills | Status: DC

## 2020-01-04 NOTE — Telephone Encounter (Signed)
Pt request refill gabapentin (NEURONTIN) 300 MG capsule at [place]

## 2020-01-04 NOTE — Addendum Note (Signed)
Addended by: Brandon Melnick on: 01/04/2020 01:27 PM   Modules accepted: Orders

## 2020-01-04 NOTE — Telephone Encounter (Signed)
I received a after-hours call message through the call service requesting a refill on his medication, I tried to call the patient back but he did not pick up the phone and voicemail was not set up with identifiers.  I reviewed his chart, he had requested a refill on the gabapentin today.  I went ahead and refilled the gabapentin for 1 month with no refills.  Please review for further refills.

## 2020-01-05 ENCOUNTER — Other Ambulatory Visit: Payer: Self-pay | Admitting: *Deleted

## 2020-01-05 NOTE — Telephone Encounter (Signed)
Last seen in September 2020, we last left it, he was referred to [facility] for consultation. A 1 month supply has been given by on call doctor, where is he on his referral, can PCP continue his gabapentin refills going forward? If not, he need annual visit for med refill.  Copied Dr. Krista Blue telephone note 10/07/19 below: Similar concerns, same issues since we saw him initially in August 2019, we have done extensive evaluations, including MRI of the brain, CT angiogram of head and neck, recent [facility] evaluation, ophthalmology evaluations failed to demonstrate etiology  Try different medications, antiepileptic medications, antidepression, migraine medications, without helping his symptoms at all  I do not think our clinic has anything more to offer, if you wish, will refer him to tertiary neurology for evaluation

## 2020-01-05 NOTE — Telephone Encounter (Signed)
Spoke to pt, he will be seeing [facility] next month.  Need month refill to get there.  This was done yesterday.  He was told that can get thru [facility], or pcp or if by [name] need to see once yearly for med refills.  He verbalized understanding.

## 2020-01-09 ENCOUNTER — Other Ambulatory Visit: Payer: Self-pay | Admitting: Neurology

## 2020-02-01 ENCOUNTER — Telehealth: Payer: Self-pay | Admitting: Pharmacist

## 2020-02-01 DIAGNOSIS — Z720 Tobacco use: Secondary | ICD-10-CM

## 2020-02-01 NOTE — Telephone Encounter (Signed)
Contacted patient to discuss smoking cessation progress, however patient did not answer. Left HIPAA compliant voicemail to contact clinical pharmacist when available.  Gabriela Irigoyen, PharmD PGY2 Ambulatory Care Resident   Pharmacy  

## 2020-02-02 ENCOUNTER — Other Ambulatory Visit: Payer: Self-pay | Admitting: Neurology

## 2020-02-02 MED ORDER — GABAPENTIN 300 MG PO CAPS: 300.0000 mg | ORAL_CAPSULE | Freq: Two times a day (BID) | ORAL | 0 refills | Status: DC

## 2020-02-02 NOTE — Telephone Encounter (Signed)
Pt request refill gabapentin (NEURONTIN) 300 MG capsule at  [place]. Pt stated cancelling [facility] appt, son will not be able to take me. Would like to keep my care here at [facility].

## 2020-02-02 NOTE — Telephone Encounter (Signed)
I think he needs to keep appointment at [facility]. I am not sure we have anything else to off him that will help.

## 2020-02-02 NOTE — Telephone Encounter (Signed)
Needs appt

## 2020-03-03 ENCOUNTER — Other Ambulatory Visit: Payer: Self-pay

## 2020-03-03 DIAGNOSIS — R42 Dizziness and giddiness: Secondary | ICD-10-CM

## 2020-03-03 MED ORDER — GABAPENTIN 300 MG PO CAPS: 300.0000 mg | ORAL_CAPSULE | Freq: Two times a day (BID) | ORAL | 0 refills | Status: DC

## 2020-03-10 ENCOUNTER — Other Ambulatory Visit: Payer: Self-pay | Admitting: *Deleted

## 2020-03-10 DIAGNOSIS — R42 Dizziness and giddiness: Secondary | ICD-10-CM

## 2020-03-10 NOTE — Telephone Encounter (Signed)
I called pt relayed received refill request.  Needs appt, cannot come has to pay bill, gets paid Friday.  Will call and pay Monday.

## 2020-03-10 NOTE — Telephone Encounter (Signed)
I do not think we have anything left to offer him, I don't think he needs an appointment, can he continue to get his medications or PCP.  He was supposed to see [facility] Per last office note in August

## 2020-03-10 NOTE — Telephone Encounter (Signed)
Will check care everywhere when I can.

## 2020-03-15 ENCOUNTER — Ambulatory Visit: Payer: BC Managed Care – PPO | Admitting: Neurology

## 2020-03-15 ENCOUNTER — Encounter: Payer: Self-pay | Admitting: Neurology

## 2020-03-15 VITALS — BP 112/74 | HR 70 | Ht 74.0 in | Wt 170.6 lb

## 2020-03-15 DIAGNOSIS — G43709 Chronic migraine without aura, not intractable, without status migrainosus: Secondary | ICD-10-CM

## 2020-03-15 DIAGNOSIS — R42 Dizziness and giddiness: Secondary | ICD-10-CM

## 2020-03-15 NOTE — Patient Instructions (Signed)
I will refer you to [facility]  Stay off the gabapentin  Need to discuss urinary symptoms with your primary doctor

## 2020-03-15 NOTE — Progress Notes (Signed)
PATIENT: Kenneth Gilbert DOB: December 24, 1965  REASON FOR VISIT: follow up HISTORY FROM: patient  HISTORY OF PRESENT ILLNESS: Today 03/15/20  HISTORY  Kenneth Gilbert a 54 year old male,seen in request by his primary care physician Dr.Jones, Marcello Moores Lfor evaluation of dizziness, initial evaluation was on January 09, 2018,  He reported gradual onset of dizziness since 2017, he describes his dizziness as woozy headed, it can happen in sitting down, standing up position, but he is a poor historian, it is hard to elaborate on the detail of his symptoms, he did have a history of bilateral otitis media, with hearing loss, worse on the left side, wearing left side hearing aid/amplifier from [place],  In the background of frequent lightheadedness, he also reported spells of sudden onset spinning sensation, he presented to the emergency room on December 19, 2017, 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  On further questioning, patient reported frequent headaches, pounding, severe, with associated dizziness, sometimes nausea, also complains of difficulty sleeping, sometimes during intense vertigo and headaches, he had transient loss of consciousness for a few minutes,  MRI of the brain without contrast in July 2019 was normal, chronic right mid ear and mastoid inflammatory changes,  CTA head and neck: no significant stenosis.  Lab in August 2019, creat 0.8, Hg 16.3, UDSis positive for marijuana,CMP showed mild elevated glucose 108,cbc,alcohol level less than 10  UPDATE Sept 19 2019: He was seen by [facility] Dr. Rachell Cipro on Sept 17 2019: No evidence of significant peripheral or central vestibular  dysfunction, no obvious of ophthalmologic abnormality would explain his recurrent episode of dizziness with associated headache,  He started taking nortriptyline 50 mg every night tolerating it well, but did not notice significant improvement, continue complains of foggy sensation, difficulty sleeping, woke up multiple times, irregular sleep pattern, sometimes catch a cat nap on his couch, complains of anxiety, there was also intermittent episode of sudden onset intense vertigo with associated headache, it can last couple hours, this has put significant limitation on his performance, he has limited income, 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  UPDATE Apr 23 2018: He is tolerating Topamax 100 mg twice a day well, also nortriptyline 50 mg 2 tablets every night, He rarely has significant vertigo, but with sudden positional change, he still felt lightheaded, also complains of dryness, constipation, frequent dreams,  Update December 22, 8428 SS: 54 year old with dizziness and chronic headache, he has had multiple telephone calls about medication questions, he has stopped taking Topamax, was started on Zonegran, but became confused about the medication regimen.  He was started on Depakote ER 500 mg at bedtime.  He is no longer taking nortriptyline.   The dizziness is improved. He has spells every now and then, but not as often. Is unsure of trigger. He may have a few spells a month. He doesn't keep track. He is poor historian. When he has a bad spell, he will lay down, he starts sweating, it will subside. With bad spells, he will take Maxalt and it will subside. He has just started Depakote, 2 days ago. He will have headache when he gets bad dizziness. He denies CP, SOB, or palpations with spells. He has minor  dizzy spell today in office, no sweating or other symptoms, was very brief.   Update March 16, 2020 SS: Here today for follow-up for med refill, was taking  gabapentin 300 mg twice a day, up until about 2 weeks ago, ran out of the medication.  Initially, was helpful for dizzy spells, feels benefit has worn off.  Later tells me, he has been having hematuria, is a poor historian, but intermittently for several months, questions possible side effect of gabapentin.  Reportedly had this with Topamax previously.  Continues with dizzy spells, about daily, will feel lightheaded, get hot and sweaty, in the last year, has passed out 2-3 times.  Sometimes headache, sometimes not.  Feels nauseated, has never vomited.  Will take Maxalt 5 mg, sometimes helps sometimes not.  Wearing left hearing aid.  Was referred to [facility] neurology for second opinion, could not make the appointment.  Is not driving, relies on his son.  Has previously had extensive evaluation, no etiology has been determined.  REVIEW OF SYSTEMS: Out of a complete 14 system review of symptoms, the patient complains only of the following symptoms, and all other reviewed systems are negative.  Dizziness  ALLERGIES: Allergies  Allergen Reactions   Codeine     REACTION: Dizzy   Gabapentin Other (See Comments)    Burning sensation in stomach   Topamax [Topiramate] Other (See Comments)    Kidney stone/hematuria    HOME MEDICATIONS: Outpatient Medications Prior to Visit  Medication Sig Dispense Refill   rizatriptan (MAXALT) 5 MG tablet Take 1 tablet (5 mg total) by mouth as needed for migraine. May repeat in 2 hours if needed. Max 2 in 24 hours. 10 tablet 1   varenicline (CHANTIX STARTING MONTH PAK) 0.5 MG X 11 & 1 MG X 42 tablet Take one 0.5 mg tablet by mouth once daily for 3 days, then increase to one 0.5 mg tablet twice daily for 4 days, then increase to one 1 mg tablet twice daily. 53 tablet 0   varenicline (CHANTIX) 1 MG tablet Take 1 tablet (1 mg total) by mouth 2 (two) times daily. 60 tablet 1   gabapentin (NEURONTIN) 300 MG capsule Take 1 capsule (300 mg total) by mouth 2 (two) times  daily. 60 capsule 0   No facility-administered medications prior to visit.    PAST MEDICAL HISTORY: Past Medical History:  Diagnosis Date   Arthritis    Dizziness    ETOH abuse    Hard of hearing    Headache    Hyperlipidemia     PAST SURGICAL HISTORY: Past Surgical History:  Procedure Laterality Date   arm surgery Right    arm surgery Left    INNER EAR SURGERY     as a child   SHOULDER SURGERY Left     FAMILY HISTORY: Family History  Problem Relation Age of Onset   Cancer Father        unknown type   Cirrhosis Sister    Heart disease Mother    Cancer Brother     SOCIAL HISTORY: Social History   Socioeconomic History   Marital status: Divorced    Spouse name: Not on file   Number of children: 4   Years of education: 12   Highest education level: High school graduate  Occupational History   Occupation: Sales executive, oil changes, rotates tires  Tobacco Use   Smoking status: Current Every Day Smoker    Packs/day: 1.00    Types: Cigarettes  Start date: 06/11/1978   Smokeless tobacco: Never Used  Vaping Use   Vaping Use: Never used  Substance and Sexual Activity   Alcohol use: No    Comment: 01/09/18 - reports no use in 3 years   Drug use: Yes    Types: Marijuana    Comment: 01/09/18 - uses marijuana occasionally   Sexual activity: Yes    Partners: Female  Other Topics Concern   Not on file  Social History Narrative   Lives at home with his son.   Caffeine use:  Drinks 3-4 cans of [place] tea (20oz cans)   Right-handed.   Social Determinants of Health   Financial Resource Strain:    Difficulty of Paying Living Expenses: Not on file  Food Insecurity:    Worried About Charity fundraiser in the Last Year: Not on file   [employer] of Food in the Last Year: Not on file  Transportation Needs:    Lack of Transportation (Medical): Not on file   Lack of Transportation (Non-Medical): Not on file  Physical Activity:     Days of Exercise per Week: Not on file   Minutes of Exercise per Session: Not on file  Stress:    Feeling of Stress : Not on file  Social Connections:    Frequency of Communication with Friends and Family: Not on file   Frequency of Social Gatherings with Friends and Family: Not on file   Attends Religious Services: Not on file   Active Member of Clubs or Organizations: Not on file   Attends Archivist Meetings: Not on file   Marital Status: Not on file  Intimate Partner Violence:    Fear of Current or Ex-Partner: Not on file   Emotionally Abused: Not on file   Physically Abused: Not on file   Sexually Abused: Not on file   PHYSICAL EXAM  Vitals:   03/15/20 1106  BP: 112/74  Pulse: 70  Weight: 170 lb 9.6 oz (77.4 kg)  Height: 6\' 2"  (1.88 m)   Body mass index is 21.9 kg/m.  Generalized: Well developed, in no acute distress   Neurological examination  Mentation: Alert oriented to time, place, history taking. Follows all commands speech and language fluent Cranial nerve II-XII: Pupils were equal round reactive to light. Extraocular movements were full, visual field were full on confrontational test. Facial sensation and strength were normal. Head turning and shoulder shrug  were normal and symmetric. Motor: The motor testing reveals 5 over 5 strength of all 4 extremities. Good symmetric motor tone is noted throughout.  Sensory: Sensory testing is intact to soft touch on all 4 extremities. No evidence of extinction is noted.  Coordination: Cerebellar testing reveals good finger-nose-finger and heel-to-shin bilaterally.  Gait and station: Gait is normal.  Reflexes: Deep tendon reflexes are symmetric and normal bilaterally.   DIAGNOSTIC DATA (LABS, IMAGING, TESTING) - I reviewed patient records, labs, notes, testing and imaging myself where available.  Lab Results  Component Value Date   WBC 6.8 10/09/2019   HGB 15.4 10/09/2019   HCT 44.9 10/09/2019    MCV 92.0 10/09/2019   PLT 169 10/09/2019      Component Value Date/Time   NA 138 10/09/2019 0742   K 4.3 10/09/2019 0742   CL 107 10/09/2019 0742   CO2 21 (L) 10/09/2019 0742   GLUCOSE 109 (H) 10/09/2019 0742   BUN 19 10/09/2019 0742   CREATININE 1.30 (H) 10/09/2019 0742   CALCIUM 8.9 10/09/2019  0742   PROT 6.9 11/18/2018 1651   ALBUMIN 4.5 11/18/2018 1651   AST 12 11/18/2018 1651   ALT 9 11/18/2018 1651   ALKPHOS 75 11/18/2018 1651   BILITOT 0.2 11/18/2018 1651   GFRNONAA >60 10/09/2019 0742   GFRAA >60 10/09/2019 0742   Lab Results  Component Value Date   CHOL 164 11/03/2019   HDL 34.50 (L) 11/03/2019   LDLCALC 112 (H) 11/03/2019   TRIG 84.0 11/03/2019   CHOLHDL 5 11/03/2019   No results found for: HGBA1C Lab Results  Component Value Date   VITAMINB12 280 11/03/2019   Lab Results  Component Value Date   TSH 0.87 11/03/2019   ASSESSMENT AND PLAN 54 y.o. year old male  has a past medical history of Arthritis, Dizziness, ETOH abuse, Hard of hearing, Headache, and Hyperlipidemia. here with:  1.  Dizziness 2.  Chronic headache with migraine features  -Cardiac monitor study in September 2020 showed sinus bradycardia, NSR, ST, average heart rate was 76, range 46-136, wide-complex tachycardia up to 9 beats, had cardiology evaluation October 2020 Dr. Johnsie Cancel, dizziness noncardiac, no correlation with arrhythmia on monitor, echo normal with good EF  -Depakote, Zonegran, nortriptyline, Effexor caused bad dreams, Topamax caused acid reflux,  Aimovig was not helpful, gabapentin caused hematuria, Maxalt lacked benefit  -Saw [facility] neuro-ophthalmologist, April 2021, no evidence of neuro ophthalmic dysfunction to explain dizziness, was referred for vestibular evaluation at [facility]  -Had normal vestibular exam 09/18/19 at [facility], has left TM perforation, no evidence of peripheral vestibular hypofunction, no BPPV  -Had bilateral cerumen removed 09/18/19, plan, pending patient interest  recommend follow-up with otologists with audiogram due to left TM perforation and otologic surgery history  -Have previously referred to [facility] neurology, he missed the appointment, to further drive, will refer to [facility] neurology for second opinion of continued dizzy spells, we have done extensive evaluation including MRI of the brain, CTA head and neck, [facility], ophthalmology evaluations failed to demonstrate etiology, we have tried multiple medications without benefit, unfortunately, I am not sure we have anything left to offer.  -I will keep him off gabapentin, he needs to see his PCP for reported hematuria, Maxalt really not helpful either, will d/c, work on getting him into [place], [place], DNP 03/15/2020, 12:14 PM [facility] [address] [place], [place] [address] [phone]

## 2020-03-17 DIAGNOSIS — R310 Gross hematuria: Secondary | ICD-10-CM | POA: Insufficient documentation

## 2020-03-17 NOTE — Progress Notes (Signed)
Subjective:    Patient ID: Kenneth Gilbert, male    DOB: June 17, 1965, 54 y.o.   MRN: [id number]  HPI The patient is here for an acute visit.   Blood in urine:  He has intermittent gross hematuria x 2 weeks.  Sometimes it is bright red blood and other times it is pink color.  He has experienced some left lower back pain and some left flank pain at x2.  He currently has left lower quadrant pain.  He denies any pain with urination.  He thinks he is urinating more frequently.  In May he did have a stone in his left ureter and had a CT scan at that time.  He is not currently taking any medications.   Ct renal 09/2019:  4 mm stone in left lower ureter w/ hydroureteronephrosis.  Nonobstructive stone b/l kidneys  Medications and allergies reviewed with patient and updated if appropriate.  Patient Active Problem List   Diagnosis Date Noted  . Gross hematuria 03/17/2020  . COVID-19 12/11/2019  . LVH (left ventricular hypertrophy) 11/03/2019  . Diastolic dysfunction without heart failure 11/03/2019  . Neuromyelopathy due to vitamin B12 deficiency ([place]) 07/17/2018  . Other chronic sinusitis 06/11/2018  . Dizziness 01/09/2018  . Chronic migraine w/o aura w/o status migrainosus, not intractable 01/09/2018  . Vertebral artery stenosis 12/11/2017  . Sensorineural hearing loss (SNHL), bilateral 12/03/2017  . Aortic atherosclerosis ([place]) 07/26/2017  . Aortic ectasia, abdominal ([place]) 07/26/2017  . Chronic vascular disorder of intestine ([place]) 06/12/2017  . Hyperlipidemia with target LDL less than 100 06/12/2017  . Routine general medical examination at a health care facility 06/11/2017  . Tobacco abuse 06/11/2017  . Calculus of kidney 02/20/2013  . GERD 07/12/2009    Current Outpatient Medications on File Prior to Visit  Medication Sig Dispense Refill  . varenicline (CHANTIX STARTING MONTH PAK) 0.5 MG X 11 & 1 MG X 42 tablet Take one 0.5 mg tablet by mouth once daily for 3 days, then increase  to one 0.5 mg tablet twice daily for 4 days, then increase to one 1 mg tablet twice daily. (Patient not taking: Reported on 03/18/2020) 53 tablet 0  . varenicline (CHANTIX) 1 MG tablet Take 1 tablet (1 mg total) by mouth 2 (two) times daily. (Patient not taking: Reported on 03/18/2020) 60 tablet 1   No current facility-administered medications on file prior to visit.    Past Medical History:  Diagnosis Date  . Arthritis   . Dizziness   . ETOH abuse   . Hard of hearing   . Headache   . Hyperlipidemia     Past Surgical History:  Procedure Laterality Date  . arm surgery Right   . arm surgery Left   . INNER EAR SURGERY     as a child  . SHOULDER SURGERY Left     Social History   Socioeconomic History  . Marital status: Divorced    Spouse name: Not on file  . Number of children: 4  . Years of education: 33  . Highest education level: High school graduate  Occupational History  . Occupation: car inspections, oil changes, rotates tires  Tobacco Use  . Smoking status: Current Every Day Smoker    Packs/day: 1.00    Types: Cigarettes    Start date: 06/11/1978  . Smokeless tobacco: Never Used  Vaping Use  . Vaping Use: Never used  Substance and Sexual Activity  . Alcohol use: No    Comment: 01/09/18 -  reports no use in 3 years  . Drug use: Yes    Types: Marijuana    Comment: 01/09/18 - uses marijuana occasionally  . Sexual activity: Yes    Partners: Female  Other Topics Concern  . Not on file  Social History Narrative   Lives at home with his son.   Caffeine use:  Drinks 3-4 cans of [place] tea (20oz cans)   Right-handed.   Social Determinants of Health   Financial Resource Strain:   . Difficulty of Paying Living Expenses: Not on file  Food Insecurity:   . Worried About Charity fundraiser in the Last Year: Not on file  . Ran Out of Food in the Last Year: Not on file  Transportation Needs:   . Lack of Transportation (Medical): Not on file  . Lack of Transportation  (Non-Medical): Not on file  Physical Activity:   . Days of Exercise per Week: Not on file  . Minutes of Exercise per Session: Not on file  Stress:   . Feeling of Stress : Not on file  Social Connections:   . Frequency of Communication with Friends and Family: Not on file  . Frequency of Social Gatherings with Friends and Family: Not on file  . Attends Religious Services: Not on file  . Active Member of Clubs or Organizations: Not on file  . Attends Archivist Meetings: Not on file  . Marital Status: Not on file    Family History  Problem Relation Age of Onset  . Cancer Father        unknown type  . Cirrhosis Sister   . Heart disease Mother   . Cancer Brother     Review of Systems  Constitutional: Negative for chills and fever.  Gastrointestinal: Positive for nausea. Negative for abdominal pain.  Genitourinary: Positive for flank pain (left ), frequency and hematuria. Negative for dysuria.  Musculoskeletal: Positive for back pain (left CVA).  Neurological: Positive for light-headedness. Negative for headaches.       Objective:   Vitals:   03/18/20 1450  BP: 118/62  Pulse: 84  Temp: 98.6 F (37 C)  SpO2: 98%   BP Readings from Last 3 Encounters:  03/18/20 118/62  03/15/20 112/74  11/03/19 118/70   Wt Readings from Last 3 Encounters:  03/18/20 172 lb (78 kg)  03/15/20 170 lb 9.6 oz (77.4 kg)  11/03/19 170 lb (77.1 kg)   Body mass index is 22.08 kg/m.   Physical Exam Constitutional:      General: He is not in acute distress.    Appearance: Normal appearance. He is not ill-appearing.  Abdominal:     Palpations: Abdomen is soft. There is no mass.     Tenderness: There is abdominal tenderness (Mild left lower quadrant ). There is no right CVA tenderness, left CVA tenderness, guarding or rebound.     Hernia: No hernia is present.  Skin:    General: Skin is warm and dry.  Neurological:     Mental Status: He is alert.            Assessment &  Plan:    See Problem List for Assessment and Plan of chronic medical problems.    This visit occurred during the SARS-CoV-2 public health emergency.  Safety protocols were in place, including screening questions prior to the visit, additional usage of staff PPE, and extensive cleaning of exam room while observing appropriate contact time as indicated for disinfecting solutions.

## 2020-03-18 ENCOUNTER — Other Ambulatory Visit: Payer: Self-pay

## 2020-03-18 ENCOUNTER — Ambulatory Visit: Payer: BC Managed Care – PPO | Admitting: Internal Medicine

## 2020-03-18 ENCOUNTER — Encounter: Payer: Self-pay | Admitting: Internal Medicine

## 2020-03-18 VITALS — BP 118/62 | HR 84 | Temp 98.6°F | Ht 74.0 in | Wt 172.0 lb

## 2020-03-18 DIAGNOSIS — R1032 Left lower quadrant pain: Secondary | ICD-10-CM | POA: Diagnosis not present

## 2020-03-18 DIAGNOSIS — R310 Gross hematuria: Secondary | ICD-10-CM | POA: Diagnosis not present

## 2020-03-18 DIAGNOSIS — N200 Calculus of kidney: Secondary | ICD-10-CM

## 2020-03-18 LAB — POC URINALSYSI DIPSTICK (AUTOMATED)
Bilirubin, UA: NEGATIVE
Glucose, UA: NEGATIVE
Ketones, UA: NEGATIVE
Leukocytes, UA: NEGATIVE
Nitrite, UA: NEGATIVE
Protein, UA: POSITIVE — AB
Spec Grav, UA: >=1.03 — AB (ref 1.010–1.025)
Urobilinogen, UA: 1 E.U./dL
pH, UA: 6 (ref 5.0–8.0)

## 2020-03-18 MED ORDER — TAMSULOSIN HCL 0.4 MG PO CAPS
0.4000 mg | ORAL_CAPSULE | Freq: Every day | ORAL | 0 refills | Status: DC
Start: 2020-03-18 — End: 2021-03-07

## 2020-03-18 MED ORDER — OXYCODONE-ACETAMINOPHEN 5-325 MG PO TABS: 1.0000 | ORAL_TABLET | Freq: Four times a day (QID) | ORAL | 0 refills | Status: DC | PRN

## 2020-03-18 MED ORDER — ONDANSETRON 4 MG PO TBDP
4.0000 mg | ORAL_TABLET | Freq: Three times a day (TID) | ORAL | 0 refills | Status: DC | PRN
Start: 2020-03-18 — End: 2021-03-07

## 2020-03-18 NOTE — Patient Instructions (Addendum)
You are likely passing a kidney stone.    Take flomax daily to help pass the stone.    Take the zofran, anti-nausea medication as needed.  Take the pain medication, percocet, as needed.   Your prescription(s) have been submitted to your pharmacy. Please take as directed and contact our office if you believe you are having problem(s) with the medication(s).  A referral was ordered for urology.   Someone from their office will call you to schedule an appointment.   A CT scan was ordered - they will call you to schedule this   Drink a lot of water.      Kidney Stones  Kidney stones are solid, rock-like deposits that form inside of the kidneys. The kidneys are a pair of organs that make urine. A kidney stone may form in a kidney and move into other parts of the urinary tract, including the tubes that connect the kidneys to the bladder (ureters), the bladder, and the tube that carries urine out of the body (urethra). As the stone moves through these areas, it can cause intense pain and block the flow of urine. Kidney stones are created when high levels of certain minerals are found in the urine. The stones are usually passed out of the body through urination, but in some cases, medical treatment may be needed to remove them. What are the causes? Kidney stones may be caused by:  A condition in which certain glands produce too much parathyroid hormone (primary hyperparathyroidism), which causes too much calcium buildup in the blood.  A buildup of uric acid crystals in the bladder (hyperuricosuria). Uric acid is a chemical that the body produces when you eat certain foods. It usually exits the body in the urine.  Narrowing (stricture) of one or both of the ureters.  A kidney blockage that is present at birth (congenital obstruction).  Past surgery on the kidney or the ureters, such as gastric bypass surgery. What increases the risk? The following factors may make you more likely to  develop this condition:  Having had a kidney stone in the past.  Having a family history of kidney stones.  Not drinking enough water.  Eating a diet that is high in protein, salt (sodium), or sugar.  Being overweight or obese. What are the signs or symptoms? Symptoms of a kidney stone may include:  Pain in the side of the abdomen, right below the ribs (flank pain). Pain usually spreads (radiates) to the groin.  Needing to urinate frequently or urgently.  Painful urination.  Blood in the urine (hematuria).  Nausea.  Vomiting.  Fever and chills. How is this diagnosed? This condition may be diagnosed based on:  Your symptoms and medical history.  A physical exam.  Blood tests.  Urine tests. These may be done before and after the stone passes out of your body through urination.  Imaging tests, such as a CT scan, abdominal X-ray, or ultrasound.  A procedure to examine the inside of the bladder (cystoscopy). How is this treated? Treatment for kidney stones depends on the size, location, and makeup of the stones. Kidney stones will often pass out of the body through urination. You may need to:  Increase your fluid intake to help pass the stone. In some cases, you may be given fluids through an IV and may need to be monitored at the hospital.  Take medicine for pain.  Make changes in your diet to help prevent kidney stones from coming back. Sometimes, medical procedures  are needed to remove a kidney stone. This may involve:  A procedure to break up kidney stones using: ? A focused beam of light (laser therapy). ? Shock waves (extracorporeal shock wave lithotripsy).  Surgery to remove kidney stones. This may be needed if you have severe pain or have stones that block your urinary tract. Follow these instructions at home: Medicines  Take over-the-counter and prescription medicines only as told by your health care provider.  Ask your health care provider if the  medicine prescribed to you requires you to avoid driving or using heavy machinery. Eating and drinking  Drink enough fluid to keep your urine pale yellow. You may be instructed to drink at least 8-10 glasses of water each day. This will help you pass the kidney stone.  If directed, change your diet. This may include: ? Limiting how much sodium you eat. ? Eating more fruits and vegetables. ? Limiting how much animal protein--such as red meat, poultry, fish, and eggs--you eat.  Follow instructions from your health care provider about eating or drinking restrictions. General instructions  Collect urine samples as told by your health care provider. You may need to collect a urine sample: ? 24 hours after you pass the stone. ? 8-12 weeks after passing the kidney stone, and every 6-12 months after that.  Strain your urine every time you urinate, for as long as directed. Use the strainer that your health care provider recommends.  Do not throw out the kidney stone after passing it. Keep the stone so it can be tested by your health care provider. Testing the makeup of your kidney stone may help prevent you from getting kidney stones in the future.  Keep all follow-up visits as told by your health care provider. This is important. You may need follow-up X-rays or ultrasounds to make sure that your stone has passed. How is this prevented? To prevent another kidney stone:  Drink enough fluid to keep your urine pale yellow. This is the best way to prevent kidney stones.  Eat a healthy diet and follow recommendations from your health care provider about foods to avoid. You may be instructed to eat a low-protein diet. Recommendations vary depending on the type of kidney stone that you have.  Maintain a healthy weight. Where to find more information  [place] ([facility]): www.kidney.[facility]): www.urologyhealth.org Contact a health care provider if:  You  have pain that gets worse or does not get better with medicine. Get help right away if:  You have a fever or chills.  You develop severe pain.  You develop new abdominal pain.  You faint.  You are unable to urinate. Summary  Kidney stones are solid, rock-like deposits that form inside of the kidneys.  Kidney stones can cause nausea, vomiting, blood in the urine, abdominal pain, and the urge to urinate frequently.  Treatment for kidney stones depends on the size, location, and makeup of the stones. Kidney stones will often pass out of the body through urination.  Kidney stones can be prevented by drinking enough fluids, eating a healthy diet, and maintaining a healthy weight. This information is not intended to replace advice given to you by your health care provider. Make sure you discuss any questions you have with your health care provider. Document Revised: 09/16/2018 Document Reviewed: 09/16/2018 Elsevier Patient Education  [place].

## 2020-03-18 NOTE — Assessment & Plan Note (Signed)
Acute Likely he is passing a stone given his history Had last passed a stone may 2021-CT scan at that time showed bilateral nephro lithiasis Urine dip here shows blood and protein no active infection We will send urine for culture Start Flomax 0.4 mg daily Percocet 5-325 1 tab every 6 hours as needed for severe pain Zofran 4 mg every 8 hours as needed for nausea Referral ordered for urology CT renal stone study ordered-likely to be done early next week which is good Advised him to call if his symptoms worsen

## 2020-03-19 LAB — URINE CULTURE: Result:: NO GROWTH

## 2020-03-21 ENCOUNTER — Inpatient Hospital Stay: Admission: RE | Admit: 2020-03-21 | Payer: BC Managed Care – PPO | Source: Ambulatory Visit

## 2020-03-23 ENCOUNTER — Telehealth: Payer: Self-pay | Admitting: Neurology

## 2020-03-23 NOTE — Telephone Encounter (Signed)
Called and spoke to [facility] and They relayed they have patient's referral and its in  Doctor Review .

## 2020-03-24 ENCOUNTER — Encounter: Payer: Self-pay | Admitting: Internal Medicine

## 2020-04-05 DIAGNOSIS — N400 Enlarged prostate without lower urinary tract symptoms: Secondary | ICD-10-CM | POA: Diagnosis not present

## 2020-04-05 DIAGNOSIS — R310 Gross hematuria: Secondary | ICD-10-CM | POA: Diagnosis not present

## 2020-04-05 DIAGNOSIS — N200 Calculus of kidney: Secondary | ICD-10-CM | POA: Diagnosis not present

## 2020-04-15 ENCOUNTER — Ambulatory Visit (INDEPENDENT_AMBULATORY_CARE_PROVIDER_SITE_OTHER)
Admission: RE | Admit: 2020-04-15 | Discharge: 2020-04-15 | Disposition: A | Discharge status: Home / Self Care | Payer: BC Managed Care – PPO | Source: Ambulatory Visit | Attending: Internal Medicine | Admitting: Internal Medicine

## 2020-04-15 ENCOUNTER — Other Ambulatory Visit: Payer: Self-pay

## 2020-04-15 DIAGNOSIS — I700 Atherosclerosis of aorta: Secondary | ICD-10-CM | POA: Diagnosis not present

## 2020-04-15 DIAGNOSIS — N200 Calculus of kidney: Secondary | ICD-10-CM | POA: Diagnosis not present

## 2020-04-15 DIAGNOSIS — N3289 Other specified disorders of bladder: Secondary | ICD-10-CM | POA: Diagnosis not present

## 2020-04-15 DIAGNOSIS — R3121 Asymptomatic microscopic hematuria: Secondary | ICD-10-CM | POA: Diagnosis not present

## 2020-04-15 DIAGNOSIS — R1032 Left lower quadrant pain: Secondary | ICD-10-CM | POA: Diagnosis not present

## 2020-04-15 DIAGNOSIS — R319 Hematuria, unspecified: Secondary | ICD-10-CM | POA: Diagnosis not present

## 2020-05-12 ENCOUNTER — Emergency Department (HOSPITAL_COMMUNITY)
Admission: EM | Admit: 2020-05-12 | Discharge: 2020-05-12 | Disposition: A | Discharge status: Home / Self Care | Payer: BC Managed Care – PPO | Attending: Emergency Medicine | Admitting: Emergency Medicine

## 2020-05-12 ENCOUNTER — Encounter (HOSPITAL_COMMUNITY): Payer: Self-pay | Admitting: Emergency Medicine

## 2020-05-12 ENCOUNTER — Emergency Department (HOSPITAL_COMMUNITY): Payer: BC Managed Care – PPO

## 2020-05-12 DIAGNOSIS — F17210 Nicotine dependence, cigarettes, uncomplicated: Secondary | ICD-10-CM | POA: Diagnosis not present

## 2020-05-12 DIAGNOSIS — M25571 Pain in right ankle and joints of right foot: Secondary | ICD-10-CM | POA: Diagnosis not present

## 2020-05-12 DIAGNOSIS — M79673 Pain in unspecified foot: Secondary | ICD-10-CM | POA: Diagnosis not present

## 2020-05-12 DIAGNOSIS — S93401A Sprain of unspecified ligament of right ankle, initial encounter: Secondary | ICD-10-CM | POA: Diagnosis not present

## 2020-05-12 DIAGNOSIS — X500XXA Overexertion from strenuous movement or load, initial encounter: Secondary | ICD-10-CM | POA: Diagnosis not present

## 2020-05-12 DIAGNOSIS — G8929 Other chronic pain: Secondary | ICD-10-CM | POA: Diagnosis not present

## 2020-05-12 DIAGNOSIS — S99911A Unspecified injury of right ankle, initial encounter: Secondary | ICD-10-CM | POA: Diagnosis not present

## 2020-05-12 DIAGNOSIS — Z8616 Personal history of COVID-19: Secondary | ICD-10-CM | POA: Insufficient documentation

## 2020-05-12 DIAGNOSIS — Y990 Civilian activity done for income or pay: Secondary | ICD-10-CM | POA: Diagnosis not present

## 2020-05-12 MED ORDER — KETOROLAC TROMETHAMINE 30 MG/ML IJ SOLN
30.0000 mg | Freq: Once | INTRAMUSCULAR | Status: AC
  Administered 2020-05-12: 30 mg via INTRAMUSCULAR
  Filled 2020-05-12: qty 1

## 2020-05-12 NOTE — Discharge Instructions (Signed)
You were seen in the emergency department today for ankle pain.   Your x-ray was reassuring, there are no broken bones or dislocations in your ankle.  I suspect your pain is secondary to ankle sprain, which is an injury to the ligaments of the joint.  You have been placed in an Ace wrap, and provided with crutches.  You may alternate Tylenol and ibuprofen every 3 hours.  Additionally you should elevate, rest, ice the joint.  In regards to your heel pain and your ongoing injury to your achilles tendon, I recommend continued follow-up with your podiatrist.    Return to the emergency department if you develop any numbness, tingling, weakness in your foot, becomes cold to the touch, develop fevers or chills, any new severe symptoms.

## 2020-05-12 NOTE — ED Provider Notes (Signed)
[facility] EMERGENCY DEPARTMENT Provider Note   CSN: [id number] Arrival date & time: 05/12/20  1022     History Chief Complaint  Patient presents with  . Ankle Pain    Kenneth Gilbert is a 54 y.o. male who presents with concern for ankle pain after he rolled it this morning at work.  Patient works for [employer], and states he stepped on a large air hose this morning and inverted his ankle.  He states that he has been having pain in the joint, complicating walking on it since that time.  Additionally, the patient has history of ongoing heel pain for several months, has seen a podiatrist who informed him that there is injury to the tendons in his ankle joint. The patient has failed conservative therapy and the podiatrist has recommended surgery.  Patient does not want surgery at this time.  Patient states that Tylenol and ibuprofen did not relieve his pain, so he does not take them at home.  He has been alternating icing the heel and applying heat. Received cortisone shots in the heel several weeks ago, without relief.  He presents today with hopes we will be able to provide him alternative treatment plan for the chronic pain in his heel, as well as address acute ankle pain.   Denies numbness, tingling, weakness in his lower extremities bilaterally.  I personally reviewed this patient's medical record. He has history of GERD, tobacco use, lipidemia, aortic atherosclerosis migraines, LVH, vertigo.  HPI     Past Medical History:  Diagnosis Date  . Arthritis   . Dizziness   . ETOH abuse   . Hard of hearing   . Headache   . Hyperlipidemia     Patient Active Problem List   Diagnosis Date Noted  . Gross hematuria 03/17/2020  . COVID-19 12/11/2019  . LVH (left ventricular hypertrophy) 11/03/2019  . Diastolic dysfunction without heart failure 11/03/2019  . Neuromyelopathy due to vitamin B12 deficiency (HCC) 07/17/2018  . Other chronic sinusitis 06/11/2018   . Dizziness 01/09/2018  . Chronic migraine w/o aura w/o status migrainosus, not intractable 01/09/2018  . Vertebral artery stenosis 12/11/2017  . Sensorineural hearing loss (SNHL), bilateral 12/03/2017  . Aortic atherosclerosis (HCC) 07/26/2017  . Aortic ectasia, abdominal (HCC) 07/26/2017  . Chronic vascular disorder of intestine (HCC) 06/12/2017  . Hyperlipidemia with target LDL less than 100 06/12/2017  . Routine general medical examination at a health care facility 06/11/2017  . Tobacco abuse 06/11/2017  . Calculus of kidney 02/20/2013  . GERD 07/12/2009    Past Surgical History:  Procedure Laterality Date  . arm surgery Right   . arm surgery Left   . INNER EAR SURGERY     as a child  . SHOULDER SURGERY Left        Family History  Problem Relation Age of Onset  . Cancer Father        unknown type  . Cirrhosis Sister   . Heart disease Mother   . Cancer Brother     Social History   Tobacco Use  . Smoking status: Current Every Day Smoker    Packs/day: 1.00    Types: Cigarettes    Start date: 06/11/1978  . Smokeless tobacco: Never Used  Vaping Use  . Vaping Use: Never used  Substance Use Topics  . Alcohol use: No    Comment: 01/09/18 - reports no use in 3 years  . Drug use: Yes    Types: Marijuana  Comment: 01/09/18 - uses marijuana occasionally    Home Medications Prior to Admission medications   Medication Sig Start Date End Date Taking? Authorizing Provider  ondansetron (ZOFRAN ODT) 4 MG disintegrating tablet Take 1 tablet (4 mg total) by mouth every 8 (eight) hours as needed for nausea or vomiting. 03/18/20   Binnie Rail, MD  oxyCODONE-acetaminophen (PERCOCET) 5-325 MG tablet Take 1 tablet by mouth every 6 (six) hours as needed. 03/18/20   Binnie Rail, MD  tamsulosin (FLOMAX) 0.4 MG CAPS capsule Take 1 capsule (0.4 mg total) by mouth daily. 03/18/20   Binnie Rail, MD    Allergies    Codeine, Gabapentin, and Topamax [topiramate]  Review of  Systems   Review of Systems  Constitutional: Negative.  Negative for chills, diaphoresis, fatigue and fever.  HENT: Negative.   Respiratory: Negative.   Cardiovascular: Negative.   Gastrointestinal: Negative.  Negative for abdominal pain, nausea and vomiting.  Musculoskeletal: Positive for arthralgias.       Right ankle pain  Neurological: Negative.  Negative for weakness, light-headedness and numbness.    Physical Exam Updated Vital Signs BP 125/77 (BP Location: Right Arm)   Pulse 76   Temp 98 F (36.7 C) (Oral)   Resp 16   SpO2 98%   Physical Exam Vitals and nursing note reviewed.  HENT:     Head: Normocephalic and atraumatic.     Nose: Nose normal.     Mouth/Throat:     Mouth: Mucous membranes are moist.     Pharynx: No oropharyngeal exudate or posterior oropharyngeal erythema.  Eyes:     General:        Right eye: No discharge.        Left eye: No discharge.     Conjunctiva/sclera: Conjunctivae normal.  Cardiovascular:     Rate and Rhythm: Normal rate and regular rhythm.     Pulses: Normal pulses.          Radial pulses are 2+ on the right side and 2+ on the left side.       Dorsalis pedis pulses are 2+ on the right side and 2+ on the left side.     Heart sounds: Normal heart sounds.  Pulmonary:     Effort: Pulmonary effort is normal. No respiratory distress.     Breath sounds: Normal breath sounds. No wheezing or rales.  Abdominal:     General: Bowel sounds are normal. There is no distension.     Tenderness: There is no abdominal tenderness.  Musculoskeletal:        General: No deformity.     Cervical back: Neck supple.     Right hip: Normal.     Left hip: Normal.     Right upper leg: Normal.     Left upper leg: Normal.     Right knee: Normal.     Left knee: Normal.     Right lower leg: Normal. No edema.     Left lower leg: Normal. No edema.     Right ankle: Tenderness present over the lateral malleolus and medial malleolus.     Right Achilles Tendon:  Tenderness present. No defects.     Left ankle:     Left Achilles Tendon: Normal.     Right foot: Normal capillary refill. Tenderness and bony tenderness present.     Left foot: Normal. Normal capillary refill.       Feet:     Comments: FROM of the ankles bilaterally,  pain of the right ankle with inversion and eversion. 5/5 plantar/dorsiflexion bilaterally.  Feet:     Comments: Neurovascularly intact in the feet bilaterally.  Skin:    General: Skin is warm and dry.  Neurological:     Mental Status: He is alert. Mental status is at baseline.     Sensory: Sensation is intact.     Motor: Motor function is intact.     Deep Tendon Reflexes:     Reflex Scores:      Patellar reflexes are 2+ on the right side and 2+ on the left side.    Comments: Achilles reflex not tested due to patient discomfort  Psychiatric:        Mood and Affect: Mood normal.     ED Results / Procedures / Treatments   Labs (all labs ordered are listed, but only abnormal results are displayed) Labs Reviewed - No data to display  EKG None  Radiology DG Ankle Complete Right  Result Date: 05/12/2020 CLINICAL DATA:  Right ankle pain for 2 weeks laterally, worsened after twisting injury. EXAM: RIGHT ANKLE - COMPLETE 3+ VIEW COMPARISON:  None. FINDINGS: No fracture or subluxation. No focal osseous lesions. No significant arthropathy. No radiopaque foreign bodies. IMPRESSION: No right ankle fracture or subluxation. Electronically Signed   By: Ilona Sorrel M.D.   On: 05/12/2020 11:38    Procedures Procedures (including critical care time)  Medications Ordered in ED Medications  ketorolac (TORADOL) 30 MG/ML injection 30 mg (30 mg Intramuscular Given 05/12/20 1256)    ED Course  I have reviewed the triage vital signs and the nursing notes.  Pertinent labs & imaging results that were available during my care of the patient were reviewed by me and considered in my medical decision making (see chart for  details).    MDM Rules/Calculators/A&P                         54 year old male presents with concern for ankle pain after he rolled it this morning, chronic heel pain secondary to tendon injury, following with podiatrist.  Differential diagnosis for this patient's symptoms include but are not limited to ankle sprain, fracture, dislocation, septic joint, gout, pseudogout.   Vital signs are normal on intake.  Physical exam reassuring; there is no joint instability of the right ankle.  No sign of infection to suggest septic joint.  Plain film of the right ankle negative for fracture or subluxation.  Suspect patient symptoms are secondary to acute ankle strain, with ongoing chronic problems with Achilles tendon and heel of the same foot.  Recommend patient continue to follow-up with podiatrist as planned.  He may utilize Tylenol / ibuprofen at home as needed for pain.  Will apply Ace wrap, provide crutches.  Given reassuring physical exam and negative x-ray, no further work-up is warranted in the emergency department at this time.  [name] voiced understanding of his medical evaluation and treatment plan.  Each of his questions were answered to his expressed satisfaction.  Return precautions were given.  Patient is well-appearing, stable, and appropriate for discharge at this time.  Final Clinical Impression(s) / ED Diagnoses Final diagnoses:  Sprain of right ankle, unspecified ligament, initial encounter    Rx / DC Orders ED Discharge Orders    None       Aura Dials 05/12/20 2134    Blanchie Dessert, MD 05/13/20 [phone]

## 2020-05-12 NOTE — ED Triage Notes (Signed)
Pt reports a knot and pain to R ankle x2 weeks, twisted ankle this am, pain worse. No obv deformity.

## 2020-08-23 ENCOUNTER — Telehealth: Payer: Self-pay | Admitting: Neurology

## 2020-08-23 NOTE — Telephone Encounter (Signed)
Was offered an appointment at [facility] to see a headache specialist, he turned it down, said he had "spells".   Has had cardiology evaluation.  Has tried multiple medications without benefit.  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  I think his only option is [facility], I don't have anything left to offer, I would have him pick up where he left off with [facility]. See his primary care doctor.

## 2020-08-23 NOTE — Telephone Encounter (Signed)
Hi Sarah see Below [facility] has denied Patient .  Before I call Patient do you want me to [facility] or [facility]. Please advise . Thanks Jonn Shingles called [facility] and Asked her to call me back on Giorgio Chabot [phone]  Lowella Grip was Pt was offered apt declined could not give date called patient serv al  times  [facility] could not not help with care for this Patient .  Spoke to [place] .

## 2020-09-05 NOTE — Telephone Encounter (Signed)
I will keep Trying Patient his Number is not working. I will Send My chart Message about his referral . Thanks Hinton Dyer .

## 2020-11-22 ENCOUNTER — Other Ambulatory Visit: Payer: Self-pay

## 2020-11-22 ENCOUNTER — Ambulatory Visit
Admission: EM | Admit: 2020-11-22 | Discharge: 2020-11-22 | Disposition: A | Discharge status: Home / Self Care | Payer: Medicaid Other

## 2020-11-22 ENCOUNTER — Encounter: Payer: Self-pay | Admitting: Emergency Medicine

## 2020-11-22 DIAGNOSIS — M25511 Pain in right shoulder: Secondary | ICD-10-CM | POA: Diagnosis not present

## 2020-11-22 NOTE — ED Triage Notes (Signed)
Pain and unable to fully lift to RT shoulder  x 2weeks.  Denies any injury

## 2020-11-22 NOTE — ED Provider Notes (Signed)
[facility]    CSN: [id number] Arrival date & time: 11/22/20  1104      History   Chief Complaint Chief Complaint  Patient presents with   Shoulder Pain    HPI Kenneth Gilbert is a 55 y.o. male.   HPI Patient presents with right lateral shoulder pain radiating into the right bicep. Pain has been present x 2 weeks. Denies any known falls or injuries. Reports a distant history of right forearm and  elbow surgery. He has a known history of arthritis. He has not taken any medication for pain. Endorses limited ability to abduct right shoulder.    Past Medical History:  Diagnosis Date   Arthritis    Dizziness    ETOH abuse    Hard of hearing    Headache    Hyperlipidemia     Patient Active Problem List   Diagnosis Date Noted   Gross hematuria 03/17/2020   COVID-19 12/11/2019   LVH (left ventricular hypertrophy) 87/56/4332   Diastolic dysfunction without heart failure 11/03/2019   Neuromyelopathy due to vitamin B12 deficiency ([place]) 07/17/2018   Other chronic sinusitis 06/11/2018   Dizziness 01/09/2018   Chronic migraine w/o aura w/o status migrainosus, not intractable 01/09/2018   Vertebral artery stenosis 12/11/2017   Sensorineural hearing loss (SNHL), bilateral 12/03/2017   Aortic atherosclerosis ([place]) 07/26/2017   Aortic ectasia, abdominal ([place]) 07/26/2017   Chronic vascular disorder of intestine ([place]) 06/12/2017   Hyperlipidemia with target LDL less than 100 06/12/2017   Routine general medical examination at a health care facility 06/11/2017   Tobacco abuse 06/11/2017   Calculus of kidney 02/20/2013   GERD 07/12/2009    Past Surgical History:  Procedure Laterality Date   arm surgery Right    arm surgery Left    INNER EAR SURGERY     as a child   SHOULDER SURGERY Left        Home Medications    Prior to Admission medications   Medication Sig Start Date End Date Taking? Authorizing Provider  ondansetron (ZOFRAN ODT) 4 MG disintegrating  tablet Take 1 tablet (4 mg total) by mouth every 8 (eight) hours as needed for nausea or vomiting. 03/18/20   Binnie Rail, MD  oxyCODONE-acetaminophen (PERCOCET) 5-325 MG tablet Take 1 tablet by mouth every 6 (six) hours as needed. 03/18/20   Binnie Rail, MD  tamsulosin (FLOMAX) 0.4 MG CAPS capsule Take 1 capsule (0.4 mg total) by mouth daily. 03/18/20   Binnie Rail, MD    Family History Family History  Problem Relation Age of Onset   Cancer Father        unknown type   Cirrhosis Sister    Heart disease Mother    Cancer Brother     Social History Social History   Tobacco Use   Smoking status: Every Day    Packs/day: 1.00    Pack years: 0.00    Types: Cigarettes    Start date: 06/11/1978   Smokeless tobacco: Never  Vaping Use   Vaping Use: Never used  Substance Use Topics   Alcohol use: No    Comment: 01/09/18 - reports no use in 3 years   Drug use: Yes    Types: Marijuana    Comment: 01/09/18 - uses marijuana occasionally     Allergies   Codeine, Gabapentin, and Topamax [topiramate]   Review of Systems Review of Systems Pertinent negatives listed in HPI    Physical Exam Triage Vital Signs  ED Triage Vitals [11/22/20 1154]  Enc Vitals Group     BP 100/70     Pulse Rate 90     Resp 19     Temp 98.3 F (36.8 C)     Temp Source Oral     SpO2 95 %     Weight      Height      Head Circumference      Peak Flow      Pain Score 10     Pain Loc      Pain Edu?      Excl. in [place]?    No data found.  Updated Vital Signs BP 100/70 (BP Location: Right Arm)   Pulse 90   Temp 98.3 F (36.8 C) (Oral)   Resp 19   SpO2 95%   Visual Acuity Right Eye Distance:   Left Eye Distance:   Bilateral Distance:    Right Eye Near:   Left Eye Near:    Bilateral Near:     Physical Exam Constitutional:      Comments: Patient refused to allow provider to conduct physical exam     UC Treatments / Results  Labs (all labs ordered are listed, but only abnormal  results are displayed) Labs Reviewed - No data to display  EKG   Radiology No results found.  Procedures Procedures (including critical care time)  Medications Ordered in UC Medications - No data to display  Initial Impression / Assessment and Plan / UC Course  I have reviewed the triage vital signs and the nursing notes.  Pertinent labs & imaging results that were available during my care of the patient were reviewed by me and considered in my medical decision making (see chart for details).    Patient became very upset when advise imaging was not necessary with xray. Given no injury and pain is more likely related to nerve.  He refused to listen as I attempted to provide him with indication x-ray imaging and what can actually be seen on x-ray, he advised "dont touch me" and walked out of exam room. Final Clinical Impressions(s) / UC Diagnoses   Final diagnoses:  Right shoulder pain, unspecified chronicity   Discharge Instructions   None    ED Prescriptions   None    PDMP not reviewed this encounter.   Scot Jun, FNP 11/22/20 1239

## 2020-12-05 ENCOUNTER — Encounter: Payer: Self-pay | Admitting: Internal Medicine

## 2021-01-06 DIAGNOSIS — M7521 Bicipital tendinitis, right shoulder: Secondary | ICD-10-CM | POA: Insufficient documentation

## 2021-01-06 DIAGNOSIS — M7581 Other shoulder lesions, right shoulder: Secondary | ICD-10-CM | POA: Insufficient documentation

## 2021-02-08 DIAGNOSIS — Z0271 Encounter for disability determination: Secondary | ICD-10-CM

## 2021-03-07 ENCOUNTER — Ambulatory Visit (INDEPENDENT_AMBULATORY_CARE_PROVIDER_SITE_OTHER): Payer: Medicaid Other

## 2021-03-07 ENCOUNTER — Ambulatory Visit (INDEPENDENT_AMBULATORY_CARE_PROVIDER_SITE_OTHER): Payer: Medicaid Other | Admitting: Internal Medicine

## 2021-03-07 ENCOUNTER — Encounter: Payer: Self-pay | Admitting: Internal Medicine

## 2021-03-07 ENCOUNTER — Other Ambulatory Visit: Payer: Self-pay

## 2021-03-07 VITALS — BP 110/72 | HR 82 | Temp 98.4°F | Ht 74.0 in | Wt 166.0 lb

## 2021-03-07 DIAGNOSIS — E538 Deficiency of other specified B group vitamins: Secondary | ICD-10-CM

## 2021-03-07 DIAGNOSIS — R310 Gross hematuria: Secondary | ICD-10-CM | POA: Diagnosis not present

## 2021-03-07 DIAGNOSIS — Z720 Tobacco use: Secondary | ICD-10-CM

## 2021-03-07 DIAGNOSIS — R053 Chronic cough: Secondary | ICD-10-CM

## 2021-03-07 DIAGNOSIS — N401 Enlarged prostate with lower urinary tract symptoms: Secondary | ICD-10-CM

## 2021-03-07 DIAGNOSIS — R3911 Hesitancy of micturition: Secondary | ICD-10-CM | POA: Diagnosis not present

## 2021-03-07 DIAGNOSIS — E785 Hyperlipidemia, unspecified: Secondary | ICD-10-CM

## 2021-03-07 DIAGNOSIS — R42 Dizziness and giddiness: Secondary | ICD-10-CM

## 2021-03-07 DIAGNOSIS — G320 Subacute combined degeneration of spinal cord in diseases classified elsewhere: Secondary | ICD-10-CM

## 2021-03-07 LAB — CBC WITH DIFFERENTIAL/PLATELET
Basophils Absolute: 0 10*3/uL (ref 0.0–0.1)
Basophils Relative: 0.3 % (ref 0.0–3.0)
Eosinophils Absolute: 0 10*3/uL (ref 0.0–0.7)
Eosinophils Relative: 0.7 % (ref 0.0–5.0)
HCT: 48.3 % (ref 39.0–52.0)
Hemoglobin: 16.2 g/dL (ref 13.0–17.0)
Lymphocytes Relative: 37.1 % (ref 12.0–46.0)
Lymphs Abs: 2.5 10*3/uL (ref 0.7–4.0)
MCHC: 33.6 g/dL (ref 30.0–36.0)
MCV: 95.3 fl (ref 78.0–100.0)
Monocytes Absolute: 0.5 10*3/uL (ref 0.1–1.0)
Monocytes Relative: 7.1 % (ref 3.0–12.0)
Neutro Abs: 3.7 10*3/uL (ref 1.4–7.7)
Neutrophils Relative %: 54.8 % (ref 43.0–77.0)
Platelets: 191 10*3/uL (ref 150.0–400.0)
RBC: 5.07 Mil/uL (ref 4.22–5.81)
RDW: 13.9 % (ref 11.5–15.5)
WBC: 6.8 10*3/uL (ref 4.0–10.5)

## 2021-03-07 LAB — BASIC METABOLIC PANEL
BUN: 11 mg/dL (ref 6–23)
CO2: 27 mEq/L (ref 19–32)
Calcium: 9.3 mg/dL (ref 8.4–10.5)
Chloride: 105 mEq/L (ref 96–112)
Creatinine, Ser: 0.9 mg/dL (ref 0.40–1.50)
GFR: 95.2 mL/min (ref >60.00)
Glucose, Bld: 73 mg/dL (ref 70–99)
Potassium: 4.3 mEq/L (ref 3.5–5.1)
Sodium: 141 mEq/L (ref 135–145)

## 2021-03-07 LAB — PSA: PSA: 0.68 ng/mL (ref 0.10–4.00)

## 2021-03-07 LAB — FOLATE: Folate: 11.1 ng/mL (ref >5.9)

## 2021-03-07 LAB — VITAMIN B12: Vitamin B-12: 222 pg/mL (ref 211–911)

## 2021-03-07 NOTE — Progress Notes (Signed)
Subjective:  Patient ID: Kenneth Gilbert, male    DOB: August 14, 1965  Age: 55 y.o. MRN: [id number]  CC: Cough  This visit occurred during the SARS-CoV-2 public health emergency.  Safety protocols were in place, including screening questions prior to the visit, additional usage of staff PPE, and extensive cleaning of exam room while observing appropriate contact time as indicated for disinfecting solutions.    HPI Kenneth Gilbert presents for f/up -  He complains of a several week history of gross, painless hematuria.  He also has a chronic NP cough and weight loss.  He complains of recurrent episodes of dizziness and wants to see a neurologist at [facility].  He denies any recent headaches.  He denies nausea, vomiting, paresthesias, chest pain, shortness of breath, or hemoptysis.  Outpatient Medications Prior to Visit  Medication Sig Dispense Refill   ondansetron (ZOFRAN ODT) 4 MG disintegrating tablet Take 1 tablet (4 mg total) by mouth every 8 (eight) hours as needed for nausea or vomiting. 20 tablet 0   oxyCODONE-acetaminophen (PERCOCET) 5-325 MG tablet Take 1 tablet by mouth every 6 (six) hours as needed. 10 tablet 0   tamsulosin (FLOMAX) 0.4 MG CAPS capsule Take 1 capsule (0.4 mg total) by mouth daily. 30 capsule 0   No facility-administered medications prior to visit.    ROS Review of Systems  Constitutional:  Negative for chills, diaphoresis, fatigue and fever.  HENT: Negative.    Eyes: Negative.   Respiratory:  Positive for cough. Negative for chest tightness, shortness of breath and wheezing.   Cardiovascular:  Negative for chest pain, palpitations and leg swelling.  Gastrointestinal:  Negative for abdominal pain, constipation, diarrhea, nausea and vomiting.  Endocrine: Negative.   Genitourinary:  Positive for hematuria. Negative for difficulty urinating, dysuria, flank pain, frequency, penile pain, penile swelling, scrotal swelling, testicular pain and urgency.   Musculoskeletal: Negative.   Skin: Negative.   Neurological:  Negative for dizziness, weakness and light-headedness.  Hematological:  Negative for adenopathy. Does not bruise/bleed easily.  Psychiatric/Behavioral: Negative.     Objective:  BP 110/72 (BP Location: Left Arm, Patient Position: Sitting, Cuff Size: Large)   Pulse 82   Temp 98.4 F (36.9 C) (Oral)   Ht 6\' 2"  (1.88 m)   Wt 166 lb (75.3 kg)   SpO2 96%   BMI 21.31 kg/m   BP Readings from Last 3 Encounters:  03/07/21 110/72  11/22/20 100/70  05/12/20 125/77    Wt Readings from Last 3 Encounters:  03/07/21 166 lb (75.3 kg)  03/18/20 172 lb (78 kg)  03/15/20 170 lb 9.6 oz (77.4 kg)    Physical Exam Vitals reviewed.  HENT:     Nose: Nose normal.     Mouth/Throat:     Mouth: Mucous membranes are moist.  Eyes:     Conjunctiva/sclera: Conjunctivae normal.  Cardiovascular:     Rate and Rhythm: Normal rate and regular rhythm.     Heart sounds: No murmur heard. Pulmonary:     Effort: Pulmonary effort is normal.     Breath sounds: No stridor. No wheezing, rhonchi or rales.  Abdominal:     General: Abdomen is flat. Bowel sounds are normal. There is no distension.     Palpations: Abdomen is soft. There is no hepatomegaly, splenomegaly or mass.     Tenderness: There is no abdominal tenderness.     Hernia: There is no hernia in the left inguinal area or right inguinal area.  Genitourinary:  Pubic Area: No rash.      Penis: Normal and circumcised.      Testes: Normal.     Epididymis:     Right: Normal.     Left: Normal.     Prostate: Enlarged. Not tender and no nodules present.     Rectum: Normal. Guaiac result negative. No mass, tenderness, anal fissure, external hemorrhoid or internal hemorrhoid. Normal anal tone.  Musculoskeletal:     Cervical back: Neck supple.  Lymphadenopathy:     Cervical: No cervical adenopathy.     Lower Body: No right inguinal adenopathy. No left inguinal adenopathy.  Neurological:      Mental Status: He is alert.    Lab Results  Component Value Date   WBC 6.8 03/07/2021   HGB 16.2 03/07/2021   HCT 48.3 03/07/2021   PLT 191.0 03/07/2021   GLUCOSE 73 03/07/2021   CHOL 164 11/03/2019   TRIG 84.0 11/03/2019   HDL 34.50 (L) 11/03/2019   LDLCALC 112 (H) 11/03/2019   ALT 9 11/18/2018   AST 12 11/18/2018   NA 141 03/07/2021   K 4.3 03/07/2021   CL 105 03/07/2021   CREATININE 0.90 03/07/2021   BUN 11 03/07/2021   CO2 27 03/07/2021   TSH 0.87 11/03/2019   PSA 0.68 03/07/2021   INR 0.97 07/01/2009    DG Chest 2 View  Result Date: 03/09/2021 CLINICAL DATA:  Chronic cough. EXAM: CHEST - 2 VIEW COMPARISON:  Chest radiograph dated 04/10/2017. FINDINGS: The heart size and mediastinal contours are within normal limits. Both lungs are clear. The visualized skeletal structures are unremarkable. IMPRESSION: No active cardiopulmonary disease. Electronically Signed   By: Anner Crete M.D.   On: 03/09/2021 00:53     Assessment & Plan:   Kenneth Gilbert was seen today for cough.  Diagnoses and all orders for this visit:  Neuromyelopathy due to vitamin B12 deficiency ([place])- His H&H, B12, and folate are normal now. -     CBC with Differential/Platelet; Future -     Vitamin B12; Future -     Folate; Future -     Folate -     Vitamin B12 -     CBC with Differential/Platelet  Hyperlipidemia with target LDL less than 100  Gross hematuria- His previous CT scan only showed nephrolithiasis.  I think he should see urology to consider undergoing a cystoscopy to screen for bladder cancer. -     Basic metabolic panel; Future -     Urinalysis, Routine w reflex microscopic; Future -     Urinalysis, Routine w reflex microscopic -     Basic metabolic panel -     Ambulatory referral to Urology  Benign prostatic hyperplasia with urinary hesitancy -     PSA; Future -     Urinalysis, Routine w reflex microscopic; Future -     Urinalysis, Routine w reflex microscopic -      PSA  Chronic cough- His chest x-ray is negative for mass or infiltrate.  I recommended that he undergo a CT scan of the chest to screen for lung cancer. -     DG Chest 2 View; Future  Dizziness -     Ambulatory referral to Neurology  Tobacco abuse -     Ambulatory Referral for Lung Cancer Scre  I have discontinued Kenneth Gilbert. Severt "RAY"'s tamsulosin, ondansetron, and oxyCODONE-acetaminophen.  No orders of the defined types were placed in this encounter.    Follow-up: Return in about 3 weeks (  around 03/28/2021).  [name], MD

## 2021-03-07 NOTE — Patient Instructions (Signed)

## 2021-03-08 LAB — URINALYSIS, ROUTINE W REFLEX MICROSCOPIC
Bilirubin Urine: NEGATIVE
Ketones, ur: NEGATIVE
Leukocytes,Ua: NEGATIVE
Nitrite: NEGATIVE
Specific Gravity, Urine: 1.025 (ref 1.000–1.030)
Urine Glucose: NEGATIVE
Urobilinogen, UA: 2 — AB (ref 0.0–1.0)
pH: 6 (ref 5.0–8.0)

## 2021-03-09 ENCOUNTER — Telehealth: Payer: Self-pay | Admitting: Internal Medicine

## 2021-03-09 DIAGNOSIS — R42 Dizziness and giddiness: Secondary | ICD-10-CM | POA: Insufficient documentation

## 2021-03-09 NOTE — Telephone Encounter (Signed)
Patient says he spoke w/ provider about placing a new referral for neurology at [facility].. patient says he is still having dizziness & would like the referral completed as soon as possible  Please call patient when completed [phone]

## 2021-03-12 ENCOUNTER — Emergency Department (HOSPITAL_COMMUNITY): Payer: Medicaid Other

## 2021-03-12 ENCOUNTER — Emergency Department (HOSPITAL_COMMUNITY)
Admission: EM | Admit: 2021-03-12 | Discharge: 2021-03-12 | Disposition: A | Discharge status: Home / Self Care | Payer: Medicaid Other | Attending: Emergency Medicine | Admitting: Emergency Medicine

## 2021-03-12 ENCOUNTER — Encounter (HOSPITAL_COMMUNITY): Payer: Self-pay

## 2021-03-12 ENCOUNTER — Other Ambulatory Visit: Payer: Self-pay

## 2021-03-12 DIAGNOSIS — K219 Gastro-esophageal reflux disease without esophagitis: Secondary | ICD-10-CM | POA: Diagnosis not present

## 2021-03-12 DIAGNOSIS — N23 Unspecified renal colic: Secondary | ICD-10-CM

## 2021-03-12 DIAGNOSIS — F17210 Nicotine dependence, cigarettes, uncomplicated: Secondary | ICD-10-CM | POA: Diagnosis not present

## 2021-03-12 DIAGNOSIS — R1031 Right lower quadrant pain: Secondary | ICD-10-CM | POA: Diagnosis present

## 2021-03-12 LAB — CBC WITH DIFFERENTIAL/PLATELET
Abs Immature Granulocytes: 0.02 10*3/uL (ref 0.00–0.07)
Basophils Absolute: 0 10*3/uL (ref 0.0–0.1)
Basophils Relative: 1 %
Eosinophils Absolute: 0.1 10*3/uL (ref 0.0–0.5)
Eosinophils Relative: 1 %
HCT: 46.1 % (ref 39.0–52.0)
Hemoglobin: 15.7 g/dL (ref 13.0–17.0)
Immature Granulocytes: 0 %
Lymphocytes Relative: 25 %
Lymphs Abs: 2.1 10*3/uL (ref 0.7–4.0)
MCH: 32.4 pg (ref 26.0–34.0)
MCHC: 34.1 g/dL (ref 30.0–36.0)
MCV: 95.1 fL (ref 80.0–100.0)
Monocytes Absolute: 0.5 10*3/uL (ref 0.1–1.0)
Monocytes Relative: 6 %
Neutro Abs: 5.6 10*3/uL (ref 1.7–7.7)
Neutrophils Relative %: 67 %
Platelets: 167 10*3/uL (ref 150–400)
RBC: 4.85 MIL/uL (ref 4.22–5.81)
RDW: 13.2 % (ref 11.5–15.5)
WBC: 8.3 10*3/uL (ref 4.0–10.5)
nRBC: 0 % (ref 0.0–0.2)

## 2021-03-12 LAB — URINALYSIS, ROUTINE W REFLEX MICROSCOPIC
Glucose, UA: NEGATIVE mg/dL
Ketones, ur: NEGATIVE mg/dL
Leukocytes,Ua: NEGATIVE
Nitrite: NEGATIVE
Protein, ur: 100 mg/dL — AB
Specific Gravity, Urine: >1.03 — ABNORMAL HIGH (ref 1.005–1.030)
pH: 6 (ref 5.0–8.0)

## 2021-03-12 LAB — BASIC METABOLIC PANEL
Anion gap: 9 (ref 5–15)
BUN: 13 mg/dL (ref 6–20)
CO2: 24 mmol/L (ref 22–32)
Calcium: 9.2 mg/dL (ref 8.9–10.3)
Chloride: 106 mmol/L (ref 98–111)
Creatinine, Ser: 0.99 mg/dL (ref 0.61–1.24)
GFR, Estimated: >60 mL/min (ref >60)
Glucose, Bld: 118 mg/dL — ABNORMAL HIGH (ref 70–99)
Potassium: 3.8 mmol/L (ref 3.5–5.1)
Sodium: 139 mmol/L (ref 135–145)

## 2021-03-12 LAB — URINALYSIS, MICROSCOPIC (REFLEX): RBC / HPF: >50 RBC/hpf (ref 0–5)

## 2021-03-12 MED ORDER — FENTANYL CITRATE PF 50 MCG/ML IJ SOSY
100.0000 ug | PREFILLED_SYRINGE | Freq: Once | INTRAMUSCULAR | Status: AC
  Administered 2021-03-12: 100 ug via INTRAVENOUS
  Filled 2021-03-12: qty 2

## 2021-03-12 MED ORDER — ONDANSETRON HCL 4 MG/2ML IJ SOLN
4.0000 mg | Freq: Once | INTRAMUSCULAR | Status: AC
  Administered 2021-03-12: 4 mg via INTRAVENOUS
  Filled 2021-03-12: qty 2

## 2021-03-12 MED ORDER — KETOROLAC TROMETHAMINE 30 MG/ML IJ SOLN
30.0000 mg | Freq: Once | INTRAMUSCULAR | Status: AC
  Administered 2021-03-12: 30 mg via INTRAVENOUS
  Filled 2021-03-12: qty 1

## 2021-03-12 MED ORDER — ONDANSETRON 8 MG PO TBDP: 8.0000 mg | ORAL_TABLET | Freq: Three times a day (TID) | ORAL | 0 refills | Status: DC | PRN

## 2021-03-12 MED ORDER — LACTATED RINGERS IV BOLUS
1000.0000 mL | Freq: Once | INTRAVENOUS | Status: AC
  Administered 2021-03-12: 1000 mL via INTRAVENOUS

## 2021-03-12 MED ORDER — HYDROCODONE-ACETAMINOPHEN 5-325 MG PO TABS: 1.0000 | ORAL_TABLET | Freq: Four times a day (QID) | ORAL | 0 refills | Status: DC | PRN

## 2021-03-12 MED ORDER — TAMSULOSIN HCL 0.4 MG PO CAPS: 0.4000 mg | ORAL_CAPSULE | Freq: Every day | ORAL | 0 refills | Status: DC

## 2021-03-12 NOTE — ED Notes (Signed)
ED Provider at bedside. 

## 2021-03-12 NOTE — ED Provider Notes (Signed)
[facility] Provider Note   CSN: [id number] Arrival date & time: 03/12/21  0150     History Chief Complaint  Patient presents with   Flank Pain    Kenneth Gilbert is a 55 y.o. male.  The history is provided by the patient.  Flank Pain This is a new problem. The current episode started 3 to 5 hours ago. The problem occurs constantly. The problem has been rapidly worsening. Associated symptoms include abdominal pain. Pertinent negatives include no chest pain and no shortness of breath. Nothing aggravates the symptoms. Nothing relieves the symptoms.  Patient reports sudden onset of right flank pain that radiates to his right abdomen.  He reports it feels similar to prior kidney stones.  No fevers or vomiting but he reports nausea.  He reports dysuria.  No previous history of urologic procedures    Past Medical History:  Diagnosis Date   Arthritis    Dizziness    ETOH abuse    Hard of hearing    Headache    Hyperlipidemia     Patient Active Problem List   Diagnosis Date Noted   Dizziness 03/09/2021   Chronic cough 03/07/2021   Benign prostatic hyperplasia with urinary hesitancy 03/07/2021   Gross hematuria 03/17/2020   LVH (left ventricular hypertrophy) 16/05/930   Diastolic dysfunction without heart failure 11/03/2019   Neuromyelopathy due to vitamin B12 deficiency ([place]) 07/17/2018   Other chronic sinusitis 06/11/2018   Vertebral artery stenosis 12/11/2017   Sensorineural hearing loss (SNHL), bilateral 12/03/2017   Aortic atherosclerosis ([place]) 07/26/2017   Aortic ectasia, abdominal ([place]) 07/26/2017   Chronic vascular disorder of intestine ([place]) 06/12/2017   Hyperlipidemia with target LDL less than 100 06/12/2017   Tobacco abuse 06/11/2017   Calculus of kidney 02/20/2013   GERD 07/12/2009    Past Surgical History:  Procedure Laterality Date   arm surgery Right    arm surgery Left    INNER EAR SURGERY     as a child   SHOULDER SURGERY Left         Family History  Problem Relation Age of Onset   Cancer Father        unknown type   Cirrhosis Sister    Heart disease Mother    Cancer Brother     Social History   Tobacco Use   Smoking status: Every Day    Packs/day: 1.00    Types: Cigarettes    Start date: 06/11/1978   Smokeless tobacco: Never  Vaping Use   Vaping Use: Never used  Substance Use Topics   Alcohol use: No    Comment: 01/09/18 - reports no use in 3 years   Drug use: Yes    Types: Marijuana    Comment: 01/09/18 - uses marijuana occasionally    Home Medications Prior to Admission medications   Medication Sig Start Date End Date Taking? Authorizing Provider  HYDROcodone-acetaminophen (NORCO/VICODIN) 5-325 MG tablet Take 1 tablet by mouth every 6 (six) hours as needed for severe pain. 03/12/21  Yes Ripley Fraise, MD  ondansetron (ZOFRAN ODT) 8 MG disintegrating tablet Take 1 tablet (8 mg total) by mouth every 8 (eight) hours as needed. 03/12/21  Yes Ripley Fraise, MD  tamsulosin (FLOMAX) 0.4 MG CAPS capsule Take 1 capsule (0.4 mg total) by mouth daily after supper. 03/12/21  Yes Ripley Fraise, MD    Allergies    Codeine, Gabapentin, and Topamax [topiramate]  Review of Systems   Review of Systems  Constitutional:  Negative for fever.  Respiratory:  Negative for shortness of breath.   Cardiovascular:  Negative for chest pain.  Gastrointestinal:  Positive for abdominal pain.  Genitourinary:  Positive for dysuria and flank pain. Negative for testicular pain.  All other systems reviewed and are negative.  Physical Exam Updated Vital Signs BP 121/82   Pulse 77   Temp 98.1 F (36.7 C) (Oral)   Resp 16   Ht 1.88 m (6\' 2" )   Wt 75 kg   SpO2 93%   BMI 21.23 kg/m   Physical Exam CONSTITUTIONAL: Well developed/well nourished elderly, uncomfortable. HEAD: Normocephalic/atraumatic EYES: EOMI/PERRL ENMT: Mucous membranes moist NECK: supple no meningeal signs SPINE/BACK:entire spine  nontender CV: S1/S2 noted, no murmurs/rubs/gallops noted LUNGS: Lungs are clear to auscultation bilaterally, no apparent distress ABDOMEN: soft, mild RLQ tender, no rebound or guarding, bowel sounds noted throughout abdomen GU: Right cva tenderness NEURO: Pt is awake/alert/appropriate, moves all extremitiesx4.  No facial droop.   EXTREMITIES: pulses normal/equal, full ROM SKIN: warm, color normal PSYCH: Mildly anxious ED Results / Procedures / Treatments   Labs (all labs ordered are listed, but only abnormal results are displayed) Labs Reviewed  BASIC METABOLIC PANEL - Abnormal; Notable for the following components:      Result Value   Glucose, Bld 118 (*)    All other components within normal limits  URINALYSIS, ROUTINE W REFLEX MICROSCOPIC - Abnormal; Notable for the following components:   Color, Urine BROWN (*)    APPearance CLOUDY (*)    Specific Gravity, Urine >1.030 (*)    Hgb urine dipstick LARGE (*)    Bilirubin Urine SMALL (*)    Protein, ur 100 (*)    All other components within normal limits  URINALYSIS, MICROSCOPIC (REFLEX) - Abnormal; Notable for the following components:   Bacteria, UA FEW (*)    All other components within normal limits  CBC WITH DIFFERENTIAL/PLATELET    EKG None  Radiology CT Renal Stone Study  Result Date: 03/12/2021 CLINICAL DATA:  Flank pain with kidney stone suspected EXAM: CT ABDOMEN AND PELVIS WITHOUT CONTRAST TECHNIQUE: Multidetector CT imaging of the abdomen and pelvis was performed following the standard protocol without IV contrast. COMPARISON:  04/15/2020 FINDINGS: Lower chest:  No contributory findings. Hepatobiliary: No focal liver abnormality.No evidence of biliary obstruction or stone. Pancreas: Unremarkable. Spleen: Unremarkable. Adrenals/Urinary Tract: Negative adrenals. Right hydroureteronephrosis due to a ureteric stone measuring 6 mm at the level of L4. At least 3 punctate right renal calculi. Two larger left-sided renal  calculi measuring up to 6 mm at the lower pole. No left hydronephrosis. Unremarkable bladder. Stomach/Bowel:  No obstruction. No visible bowel inflammation Vascular/Lymphatic: No acute vascular abnormality. Atheromatous calcification of the aorta and iliacs no mass or adenopathy. Reproductive:No pathologic findings. Other: No ascites or pneumoperitoneum. Musculoskeletal: No acute abnormalities. IMPRESSION: 1. Obstructing 6 mm stone in the right ureter at the level of L4. 2. Bilateral nephrolithiasis. 3.  Aortic Atherosclerosis (ICD10-I70.0). Electronically Signed   By: Jorje Guild M.D.   On: 03/12/2021 06:11    Procedures Procedures   Medications Ordered in ED Medications  fentaNYL (SUBLIMAZE) injection 100 mcg (100 mcg Intravenous Given 03/12/21 0255)  ondansetron (ZOFRAN) injection 4 mg (4 mg Intravenous Given 03/12/21 0255)  lactated ringers bolus 1,000 mL (0 mLs Intravenous Stopped 03/12/21 0624)  fentaNYL (SUBLIMAZE) injection 100 mcg (100 mcg Intravenous Given 03/12/21 0513)  ketorolac (TORADOL) 30 MG/ML injection 30 mg (30 mg Intravenous Given 03/12/21 2536)    ED Course  I have reviewed the triage vital signs and the nursing notes.  Pertinent labs & imaging results that were available during my care of the patient were reviewed by me and considered in my medical decision making (see chart for details).    MDM Rules/Calculators/A&P                           Patient presented with sudden onset of right flank and abdominal pain.  He reports previous history of kidney stones.  We were unable to control his pain so we proceeded with CT imaging.  CT scan reveals 6 mm ureteral stone.  No signs of UTI Patient is now feeling improved.  He is not septic appearing Overall he is appropriate for outpatient management.  Discussed the case with his son via phone.  Advised he will need close follow-up with urology.  Will prescribe Vicodin/Zofran/Flomax Patient is safe for discharge Final  Clinical Impression(s) / ED Diagnoses Final diagnoses:  Ureteral colic    Rx / DC Orders ED Discharge Orders          Ordered    HYDROcodone-acetaminophen (NORCO/VICODIN) 5-325 MG tablet  Every 6 hours PRN        03/12/21 0704    ondansetron (ZOFRAN ODT) 8 MG disintegrating tablet  Every 8 hours PRN        03/12/21 0704    tamsulosin (FLOMAX) 0.4 MG CAPS capsule  Daily after supper        03/12/21 0704             Ripley Fraise, MD 03/12/21 [phone]

## 2021-03-12 NOTE — ED Triage Notes (Signed)
Pt arrives from home c/o bilateral flank pain. Hx of kidney stones. Pt states he noticed blood in his urine 2 days ago.

## 2021-03-12 NOTE — ED Notes (Signed)
Pt is HOH. Son at bedside.

## 2021-03-14 ENCOUNTER — Other Ambulatory Visit: Payer: Self-pay

## 2021-03-14 ENCOUNTER — Telehealth: Payer: Self-pay | Admitting: Internal Medicine

## 2021-03-14 ENCOUNTER — Other Ambulatory Visit: Payer: Self-pay | Admitting: Internal Medicine

## 2021-03-14 ENCOUNTER — Encounter: Payer: Self-pay | Admitting: Urology

## 2021-03-14 ENCOUNTER — Ambulatory Visit (INDEPENDENT_AMBULATORY_CARE_PROVIDER_SITE_OTHER): Payer: Medicaid Other | Admitting: Urology

## 2021-03-14 VITALS — BP 113/70 | HR 82

## 2021-03-14 DIAGNOSIS — R310 Gross hematuria: Secondary | ICD-10-CM

## 2021-03-14 DIAGNOSIS — N201 Calculus of ureter: Secondary | ICD-10-CM | POA: Diagnosis not present

## 2021-03-14 DIAGNOSIS — N200 Calculus of kidney: Secondary | ICD-10-CM

## 2021-03-14 LAB — URINALYSIS, ROUTINE W REFLEX MICROSCOPIC
Bilirubin, UA: NEGATIVE
Glucose, UA: NEGATIVE
Leukocytes,UA: NEGATIVE
Nitrite, UA: NEGATIVE
Protein,UA: NEGATIVE
Specific Gravity, UA: 1.02 (ref 1.005–1.030)
Urobilinogen, Ur: >8 mg/dL — ABNORMAL HIGH (ref 0.2–1.0)
pH, UA: 8.5 — ABNORMAL HIGH (ref 5.0–7.5)

## 2021-03-14 LAB — MICROSCOPIC EXAMINATION
Bacteria, UA: NONE SEEN
Epithelial Cells (non renal): NONE SEEN /hpf (ref 0–10)
RBC, Urine: >30 /hpf — AB (ref 0–2)
Renal Epithel, UA: NONE SEEN /hpf
WBC, UA: NONE SEEN /hpf (ref 0–5)

## 2021-03-14 MED ORDER — HYDROCODONE-ACETAMINOPHEN 5-325 MG PO TABS: 1.0000 | ORAL_TABLET | Freq: Four times a day (QID) | ORAL | 0 refills | Status: DC | PRN

## 2021-03-14 NOTE — Progress Notes (Signed)
See   Assessment: 1. Ureteral calculus, right   2. Nephrolithiasis   3. Gross hematuria; negative evaluation 12/21     Plan: I personally reviewed the CT study from 03/12/2021 showing bilateral renal calculi and a 5 x 6 mm right ureteral calculus at the level of L4 with obstructive changes. Options for management of the right ureteral calculus discussed with the patient including spontaneous passage, shockwave lithotripsy, and ureteroscopic laser lithotripsy.  Risk and benefits of each treatment discussed.  Following our discussion, he would like to attempt spontaneous passage. Continue pain meds prn Continue tamsulosin Strain urine Return to office in 1 week with KUB I personally reviewed the patient's prior urology records from [facility].  Chief Complaint:  Chief Complaint  Patient presents with   Nephrolithiasis     History of Present Illness:  Kenneth Gilbert is a 55 y.o. year old male who is seen in consultation from Janith Lima, MD for evaluation of right ureteral calculus.  He was seen by his PCP, Dr. Ronnald Ramp, on 03/07/2021 with a several week history of intermittent gross hematuria.  Urinalysis demonstrated 21-50 RBCs.  He presented to the emergency room on 03/12/2021 with right-sided flank and abdominal pain.  CT imaging showed a 6 mm calculus in the right ureter at the level of L4 with evidence of obstruction and bilateral nephrolithiasis. He continues to have intermittent right sided flank pain.  He is taking pain medication intermittently with relief of his symptoms.  He does report some nausea without vomiting.  No fevers or chills.  He is on tamsulosin but has not been straining his urine on a regular basis. He does have a history of kidney stones but has not required surgical intervention.  He has a history of gross and microscopic hematuria.  He has previously been evaluated by Dr. Jeffie Pollock with CT imaging and cystoscopy.  CT hematuria protocol from 11/21 showed  a left extrarenal pelvis, bilateral lower pole calculi, no obstruction or filling defects.  Cystoscopy from 12/21 showed no bladder abnormalities.  He does have a history of tobacco use smoking up to 1-1/2 packs/day for approximately 40 years.   Past Medical History:  Past Medical History:  Diagnosis Date   Arthritis    Dizziness    ETOH abuse    Hard of hearing    Headache    Hyperlipidemia     Past Surgical History:  Past Surgical History:  Procedure Laterality Date   arm surgery Right    arm surgery Left    INNER EAR SURGERY     as a child   SHOULDER SURGERY Left     Allergies:  Allergies  Allergen Reactions   Codeine     REACTION: Dizzy   Gabapentin Other (See Comments)    Burning sensation in stomach   Topamax [Topiramate] Other (See Comments)    Kidney stone/hematuria    Family History:  Family History  Problem Relation Age of Onset   Cancer Father        unknown type   Cirrhosis Sister    Heart disease Mother    Cancer Brother     Social History:  Social History   Tobacco Use   Smoking status: Every Day    Packs/day: 1.00    Types: Cigarettes    Start date: 06/11/1978   Smokeless tobacco: Never  Vaping Use   Vaping Use: Never used  Substance Use Topics   Alcohol use: No    Comment: 01/09/18 -  reports no use in 3 years   Drug use: Yes    Types: Marijuana    Comment: 01/09/18 - uses marijuana occasionally    Review of symptoms:  Constitutional:  Negative for unexplained weight loss, night sweats, fever, chills ENT:  Negative for nose bleeds, sinus pain, painful swallowing CV:  Negative for chest pain, shortness of breath, exercise intolerance, palpitations, loss of consciousness Resp:  Negative for cough, wheezing, shortness of breath GI:  Negative for nausea, vomiting, diarrhea, bloody stools GU:  Positives noted in HPI; otherwise negative for gross hematuria, dysuria, urinary incontinence Neuro:  Negative for seizures, poor balance, limb  weakness, slurred speech Psych:  Negative for lack of energy, depression, anxiety Endocrine:  Negative for polydipsia, polyuria, symptoms of hypoglycemia (dizziness, hunger, sweating) Hematologic:  Negative for anemia, purpura, petechia, prolonged or excessive bleeding, use of anticoagulants  Allergic:  Negative for difficulty breathing or choking as a result of exposure to anything; no shellfish allergy; no allergic response (rash/itch) to materials, foods  Physical exam: BP 113/70   Pulse 82  GENERAL APPEARANCE:  Well appearing, well developed, well nourished, NAD HEENT: Atraumatic, Normocephalic, oropharynx clear. NECK: Supple without lymphadenopathy or thyromegaly. LUNGS: Clear to auscultation bilaterally. HEART: Regular Rate and Rhythm without murmurs, gallops, or rubs. ABDOMEN: Soft, non-tender, No Masses. EXTREMITIES: Moves all extremities well.  Without clubbing, cyanosis, or edema. NEUROLOGIC:  Alert and oriented x 3, normal gait, CN II-XII grossly intact.  MENTAL STATUS:  Appropriate. BACK:  Non-tender to palpation.  No CVAT SKIN:  Warm, dry and intact.    Results: Results for orders placed or performed in visit on 03/14/21 (from the past 24 hour(s))  Microscopic Examination   Collection Time: 03/14/21  2:34 PM   Urine  Result Value Ref Range   WBC, UA None seen 0 - 5 /hpf   RBC >30 (A) 0 - 2 /hpf   Epithelial Cells (non renal) None seen 0 - 10 /hpf   Renal Epithel, UA None seen None seen /hpf   Mucus, UA Present Not Estab.   Bacteria, UA None seen None seen/Few  Urinalysis, Routine w reflex microscopic   Collection Time: 03/14/21  2:34 PM  Result Value Ref Range   Specific Gravity, UA 1.020 1.005 - 1.030   pH, UA 8.5 (H) 5.0 - 7.5   Color, UA Yellow Yellow   Appearance Ur Clear Clear   Leukocytes,UA Negative Negative   Protein,UA Negative Negative/Trace   Glucose, UA Negative Negative   Ketones, UA Trace (A) Negative   RBC, UA 2+ (A) Negative   Bilirubin, UA  Negative Negative   Urobilinogen, Ur >8.0 (H) 0.2 - 1.0 mg/dL   Nitrite, UA Negative Negative   Microscopic Examination See below:

## 2021-03-14 NOTE — H&P (View-Only) (Signed)
See   Assessment: 1. Ureteral calculus, right   2. Nephrolithiasis   3. Gross hematuria; negative evaluation 12/21     Plan: I personally reviewed the CT study from 03/12/2021 showing bilateral renal calculi and a 5 x 6 mm right ureteral calculus at the level of L4 with obstructive changes. Options for management of the right ureteral calculus discussed with the patient including spontaneous passage, shockwave lithotripsy, and ureteroscopic laser lithotripsy.  Risk and benefits of each treatment discussed.  Following our discussion, he would like to attempt spontaneous passage. Continue pain meds prn Continue tamsulosin Strain urine Return to office in 1 week with KUB I personally reviewed the patient's prior urology records from [facility].  Chief Complaint:  Chief Complaint  Patient presents with   Nephrolithiasis     History of Present Illness:  Kenneth Gilbert is a 55 y.o. year old male who is seen in consultation from Janith Lima, MD for evaluation of right ureteral calculus.  He was seen by his PCP, Dr. Ronnald Ramp, on 03/07/2021 with a several week history of intermittent gross hematuria.  Urinalysis demonstrated 21-50 RBCs.  He presented to the emergency room on 03/12/2021 with right-sided flank and abdominal pain.  CT imaging showed a 6 mm calculus in the right ureter at the level of L4 with evidence of obstruction and bilateral nephrolithiasis. He continues to have intermittent right sided flank pain.  He is taking pain medication intermittently with relief of his symptoms.  He does report some nausea without vomiting.  No fevers or chills.  He is on tamsulosin but has not been straining his urine on a regular basis. He does have a history of kidney stones but has not required surgical intervention.  He has a history of gross and microscopic hematuria.  He has previously been evaluated by Dr. Jeffie Pollock with CT imaging and cystoscopy.  CT hematuria protocol from 11/21 showed  a left extrarenal pelvis, bilateral lower pole calculi, no obstruction or filling defects.  Cystoscopy from 12/21 showed no bladder abnormalities.  He does have a history of tobacco use smoking up to 1-1/2 packs/day for approximately 40 years.   Past Medical History:  Past Medical History:  Diagnosis Date   Arthritis    Dizziness    ETOH abuse    Hard of hearing    Headache    Hyperlipidemia     Past Surgical History:  Past Surgical History:  Procedure Laterality Date   arm surgery Right    arm surgery Left    INNER EAR SURGERY     as a child   SHOULDER SURGERY Left     Allergies:  Allergies  Allergen Reactions   Codeine     REACTION: Dizzy   Gabapentin Other (See Comments)    Burning sensation in stomach   Topamax [Topiramate] Other (See Comments)    Kidney stone/hematuria    Family History:  Family History  Problem Relation Age of Onset   Cancer Father        unknown type   Cirrhosis Sister    Heart disease Mother    Cancer Brother     Social History:  Social History   Tobacco Use   Smoking status: Every Day    Packs/day: 1.00    Types: Cigarettes    Start date: 06/11/1978   Smokeless tobacco: Never  Vaping Use   Vaping Use: Never used  Substance Use Topics   Alcohol use: No    Comment: 01/09/18 -  reports no use in 3 years   Drug use: Yes    Types: Marijuana    Comment: 01/09/18 - uses marijuana occasionally    Review of symptoms:  Constitutional:  Negative for unexplained weight loss, night sweats, fever, chills ENT:  Negative for nose bleeds, sinus pain, painful swallowing CV:  Negative for chest pain, shortness of breath, exercise intolerance, palpitations, loss of consciousness Resp:  Negative for cough, wheezing, shortness of breath GI:  Negative for nausea, vomiting, diarrhea, bloody stools GU:  Positives noted in HPI; otherwise negative for gross hematuria, dysuria, urinary incontinence Neuro:  Negative for seizures, poor balance, limb  weakness, slurred speech Psych:  Negative for lack of energy, depression, anxiety Endocrine:  Negative for polydipsia, polyuria, symptoms of hypoglycemia (dizziness, hunger, sweating) Hematologic:  Negative for anemia, purpura, petechia, prolonged or excessive bleeding, use of anticoagulants  Allergic:  Negative for difficulty breathing or choking as a result of exposure to anything; no shellfish allergy; no allergic response (rash/itch) to materials, foods  Physical exam: BP 113/70   Pulse 82  GENERAL APPEARANCE:  Well appearing, well developed, well nourished, NAD HEENT: Atraumatic, Normocephalic, oropharynx clear. NECK: Supple without lymphadenopathy or thyromegaly. LUNGS: Clear to auscultation bilaterally. HEART: Regular Rate and Rhythm without murmurs, gallops, or rubs. ABDOMEN: Soft, non-tender, No Masses. EXTREMITIES: Moves all extremities well.  Without clubbing, cyanosis, or edema. NEUROLOGIC:  Alert and oriented x 3, normal gait, CN II-XII grossly intact.  MENTAL STATUS:  Appropriate. BACK:  Non-tender to palpation.  No CVAT SKIN:  Warm, dry and intact.    Results: Results for orders placed or performed in visit on 03/14/21 (from the past 24 hour(s))  Microscopic Examination   Collection Time: 03/14/21  2:34 PM   Urine  Result Value Ref Range   WBC, UA None seen 0 - 5 /hpf   RBC >30 (A) 0 - 2 /hpf   Epithelial Cells (non renal) None seen 0 - 10 /hpf   Renal Epithel, UA None seen None seen /hpf   Mucus, UA Present Not Estab.   Bacteria, UA None seen None seen/Few  Urinalysis, Routine w reflex microscopic   Collection Time: 03/14/21  2:34 PM  Result Value Ref Range   Specific Gravity, UA 1.020 1.005 - 1.030   pH, UA 8.5 (H) 5.0 - 7.5   Color, UA Yellow Yellow   Appearance Ur Clear Clear   Leukocytes,UA Negative Negative   Protein,UA Negative Negative/Trace   Glucose, UA Negative Negative   Ketones, UA Trace (A) Negative   RBC, UA 2+ (A) Negative   Bilirubin, UA  Negative Negative   Urobilinogen, Ur >8.0 (H) 0.2 - 1.0 mg/dL   Nitrite, UA Negative Negative   Microscopic Examination See below:

## 2021-03-14 NOTE — Telephone Encounter (Signed)
Patient is requesting Urology referral be switched to:  [facility] [address] [place], [place] [address]

## 2021-03-14 NOTE — Progress Notes (Signed)
Urological Symptom Review  Patient is experiencing the following symptoms: Blood in urine   Review of Systems  Gastrointestinal (upper)  : Nausea  Gastrointestinal (lower) : Negative for lower GI symptoms  Constitutional : Night Sweats  Skin: Negative for skin symptoms  Eyes: Negative for eye symptoms  Ear/Nose/Throat : Negative for Ear/Nose/Throat symptoms  Hematologic/Lymphatic: Negative for Hematologic/Lymphatic symptoms  Cardiovascular : Negative for cardiovascular symptoms  Respiratory : Cough Shortness of breath  Endocrine: Excessive thirst  Musculoskeletal: Back pain Joint pain  Neurological: Dizziness  Psychologic: Negative for psychiatric symptoms

## 2021-03-20 ENCOUNTER — Other Ambulatory Visit: Payer: Self-pay

## 2021-03-20 ENCOUNTER — Ambulatory Visit (HOSPITAL_COMMUNITY)
Admission: RE | Admit: 2021-03-20 | Discharge: 2021-03-20 | Disposition: A | Discharge status: Home / Self Care | Payer: Medicaid Other | Source: Ambulatory Visit | Attending: Urology | Admitting: Urology

## 2021-03-20 DIAGNOSIS — N201 Calculus of ureter: Secondary | ICD-10-CM | POA: Diagnosis present

## 2021-03-21 ENCOUNTER — Encounter: Payer: Self-pay | Admitting: Urology

## 2021-03-21 ENCOUNTER — Ambulatory Visit (INDEPENDENT_AMBULATORY_CARE_PROVIDER_SITE_OTHER): Payer: Medicaid Other | Admitting: Urology

## 2021-03-21 ENCOUNTER — Other Ambulatory Visit: Payer: Self-pay

## 2021-03-21 VITALS — BP 106/77 | HR 87 | Temp 98.4°F

## 2021-03-21 DIAGNOSIS — N201 Calculus of ureter: Secondary | ICD-10-CM | POA: Diagnosis not present

## 2021-03-21 DIAGNOSIS — R310 Gross hematuria: Secondary | ICD-10-CM

## 2021-03-21 DIAGNOSIS — N200 Calculus of kidney: Secondary | ICD-10-CM | POA: Diagnosis not present

## 2021-03-21 LAB — MICROSCOPIC EXAMINATION
Bacteria, UA: NONE SEEN
Epithelial Cells (non renal): NONE SEEN /hpf (ref 0–10)
Renal Epithel, UA: NONE SEEN /hpf
WBC, UA: NONE SEEN /hpf (ref 0–5)

## 2021-03-21 LAB — URINALYSIS, ROUTINE W REFLEX MICROSCOPIC
Bilirubin, UA: NEGATIVE
Glucose, UA: NEGATIVE
Ketones, UA: NEGATIVE
Leukocytes,UA: NEGATIVE
Nitrite, UA: NEGATIVE
Protein,UA: NEGATIVE
Specific Gravity, UA: 1.02 (ref 1.005–1.030)
Urobilinogen, Ur: 4 mg/dL — ABNORMAL HIGH (ref 0.2–1.0)
pH, UA: 7 (ref 5.0–7.5)

## 2021-03-21 NOTE — Progress Notes (Signed)
Urological Symptom Review  Patient is experiencing the following symptoms: Burning/pain with urination Get up at night to urinate Blood in urine Kidney stones   Review of Systems  Gastrointestinal (upper)  : Nausea  Gastrointestinal (lower) : Negative for lower GI symptoms  Constitutional : Night Sweats Weight loss  Skin: Negative for skin symptoms  Eyes: Negative for eye symptoms  Ear/Nose/Throat : Negative for Ear/Nose/Throat symptoms  Hematologic/Lymphatic: Negative for Hematologic/Lymphatic symptoms  Cardiovascular : Negative for cardiovascular symptoms  Respiratory : Shortness of breath  Endocrine: Negative for endocrine symptoms  Musculoskeletal: Back pain Joint pain  Neurological: Dizziness  Psychologic: Negative for psychiatric symptoms

## 2021-03-21 NOTE — Progress Notes (Signed)
Assessment: 1. Ureteral calculus, right   2. Nephrolithiasis   3. Gross hematuria; negative evaluation 12/21      Plan: I personally reviewed the KUB study from 03/20/2021 showing a 4 mm calcification to the right of the L3 transverse process consistent with a right ureteral calculus.  Results discussed with the patient.  Options for management of the right ureteral calculus discussed including spontaneous passage, shockwave lithotripsy, and ureteroscopic stone manipulation.  He would like to schedule for shockwave lithotripsy. Continue pain meds prn Continue tamsulosin Strain urine KUB next week prior to shockwave lithotripsy to confirm stone position.  Procedure: The patient will be scheduled for Right ESL at [facility].  Surgical request is placed with the surgery schedulers and will be scheduled at the patient's/family request. Informed consent is given as documented below. Anesthesia:  IV sedation  The patient does not have sleep apnea, history of MRSA, history of VRE, history of cardiac device requiring special anesthetic needs. Patient is stable and considered clear for surgical in an outpatient ambulatory surgery setting as well as patient hospital setting.  Consent for Operation or Procedure: Provider Certification I hereby certify that the nature, purpose, benefits, usual and most frequent risks of, and alternatives to, the operation or procedure have been explained to the patient (or person authorized to sign for the patient) either by me as responsible physician or by the provider who is to perform the operation or procedure. Time spent such that the patient/family has had an opportunity to ask questions, and that those questions have been answered. The patient or the patient's representative has been advised that selected tasks may be performed by assistants to the primary health care provider(s). I believe that the patient (or person authorized to sign for the patient)  understands what has been explained, and has consented to the operation or procedure. No guarantees were implied or made.   Chief Complaint:  Chief Complaint  Patient presents with   ureteral calculus      History of Present Illness:  Kenneth Gilbert is a 55 y.o. year old male who is seen for further evaluation of a right ureteral calculus.  He was seen by his PCP, Dr. Ronnald Ramp, on 03/07/2021 with a several week history of intermittent gross hematuria.  Urinalysis demonstrated 21-50 RBCs.  He presented to the emergency room on 03/12/2021 with right-sided flank and abdominal pain.  CT imaging showed a 6 mm calculus in the right ureter at the level of L4 with evidence of obstruction and bilateral nephrolithiasis. He continues to have intermittent right sided flank pain.  He is taking pain medication intermittently with relief of his symptoms.  He does report some nausea without vomiting.  No fevers or chills.  He is on tamsulosin but has not been straining his urine on a regular basis. He does have a history of kidney stones but has not required surgical intervention.  He has a history of gross and microscopic hematuria.  He has previously been evaluated by Dr. Jeffie Pollock with CT imaging and cystoscopy.  CT hematuria protocol from 11/21 showed a left extrarenal pelvis, bilateral lower pole calculi, no obstruction or filling defects.  Cystoscopy from 12/21 showed no bladder abnormalities.  He does have a history of tobacco use smoking up to 1-1/2 packs/day for approximately 40 years.  He returns today for follow-up.  KUB from 03/20/2021 showed a 4 mm calcification to the right of L3 corresponding to the previously noted right ureteral stone.  He has not  passed a stone.  He had 1 episode of pain several days ago which responded to a pain pill.  No fevers, chills, nausea, or vomiting.  He continues on tamsulosin and is straining his urine.  Portions of the above documentation were copied from a prior visit  for review purposes only.   Past Medical History:  Past Medical History:  Diagnosis Date   Arthritis    Dizziness    ETOH abuse    Hard of hearing    Headache    Hyperlipidemia     Past Surgical History:  Past Surgical History:  Procedure Laterality Date   arm surgery Right    arm surgery Left    INNER EAR SURGERY     as a child   SHOULDER SURGERY Left     Allergies:  Allergies  Allergen Reactions   Codeine     REACTION: Dizzy   Gabapentin Other (See Comments)    Burning sensation in stomach   Topamax [Topiramate] Other (See Comments)    Kidney stone/hematuria    Family History:  Family History  Problem Relation Age of Onset   Cancer Father        unknown type   Cirrhosis Sister    Heart disease Mother    Cancer Brother     Social History:  Social History   Tobacco Use   Smoking status: Every Day    Packs/day: 1.00    Types: Cigarettes    Start date: 06/11/1978   Smokeless tobacco: Never  Vaping Use   Vaping Use: Never used  Substance Use Topics   Alcohol use: No    Comment: 01/09/18 - reports no use in 3 years   Drug use: Yes    Types: Marijuana    Comment: 01/09/18 - uses marijuana occasionally    ROS: Constitutional:  Negative for fever, chills, weight loss CV: Negative for chest pain, previous MI, hypertension Respiratory:  Negative for shortness of breath, wheezing, sleep apnea, frequent cough GI:  Negative for nausea, vomiting, bloody stool, GERD  Physical exam: BP 106/77   Pulse 87   Temp 98.4 F (36.9 C)  GENERAL APPEARANCE:  Well appearing, well developed, well nourished, NAD HEENT:  Atraumatic, normocephalic, oropharynx clear NECK:  Supple without lymphadenopathy or thyromegaly ABDOMEN:  Soft, non-tender, no masses EXTREMITIES:  Moves all extremities well, without clubbing, cyanosis, or edema NEUROLOGIC:  Alert and oriented x 3, normal gait, CN II-XII grossly intact MENTAL STATUS:  appropriate BACK:  Non-tender to palpation, No  CVAT SKIN:  Warm, dry, and intact  Results: U/A:  11-30 RBC  ABDOMEN - 1 VIEW   COMPARISON:  CT 03/13/2019   FINDINGS: Nonobstructed gas pattern with moderate stool. 4 mm stone overlying the lower pole of the left kidney. No pelvic calcifications. 4 mm calcification to the right of L3 could relate to the patient's right ureteral stone   IMPRESSION: 1. 4 mm calcification to the right of L3 may correspond to the previously noted ureteral stone but is slightly more cranial compared to the CT 2. Left kidney stone     Electronically Signed   By: Donavan Foil M.D.   On: 03/21/2021 00:07

## 2021-03-23 ENCOUNTER — Telehealth: Payer: Self-pay

## 2021-03-23 NOTE — Telephone Encounter (Signed)
Patient called and notified to have KUB done on 03/27/2021 prior to [place] on 03/28/2021  Patient voiced understanding.

## 2021-03-27 ENCOUNTER — Encounter (HOSPITAL_COMMUNITY): Payer: Self-pay

## 2021-03-27 ENCOUNTER — Encounter (HOSPITAL_COMMUNITY)
Admission: RE | Admit: 2021-03-27 | Discharge: 2021-03-27 | Disposition: A | Discharge status: Home / Self Care | Payer: Medicaid Other | Source: Ambulatory Visit | Attending: Urology | Admitting: Urology

## 2021-03-27 ENCOUNTER — Other Ambulatory Visit: Payer: Self-pay

## 2021-03-28 ENCOUNTER — Encounter (HOSPITAL_COMMUNITY)
Admission: RE | Disposition: A | Discharge status: Home / Self Care | Payer: Self-pay | Source: Home / Self Care | Attending: Urology

## 2021-03-28 ENCOUNTER — Ambulatory Visit (HOSPITAL_COMMUNITY)
Admission: RE | Admit: 2021-03-28 | Discharge: 2021-03-28 | Disposition: A | Discharge status: Home / Self Care | Payer: Medicaid Other | Attending: Urology | Admitting: Urology

## 2021-03-28 ENCOUNTER — Ambulatory Visit (HOSPITAL_COMMUNITY): Payer: Medicaid Other

## 2021-03-28 DIAGNOSIS — N202 Calculus of kidney with calculus of ureter: Secondary | ICD-10-CM | POA: Diagnosis present

## 2021-03-28 DIAGNOSIS — N201 Calculus of ureter: Secondary | ICD-10-CM

## 2021-03-28 HISTORY — PX: EXTRACORPOREAL SHOCK WAVE LITHOTRIPSY: SHX1557

## 2021-03-28 SURGERY — LITHOTRIPSY, ESWL
Anesthesia: LOCAL | Laterality: Right

## 2021-03-28 MED ORDER — DIAZEPAM 5 MG PO TABS
ORAL_TABLET | ORAL | Status: AC
  Filled 2021-03-28: qty 2

## 2021-03-28 MED ORDER — HYDROCODONE-ACETAMINOPHEN 5-325 MG PO TABS: 1.0000 | ORAL_TABLET | Freq: Four times a day (QID) | ORAL | 0 refills | Status: DC | PRN

## 2021-03-28 MED ORDER — DIPHENHYDRAMINE HCL 25 MG PO CAPS
ORAL_CAPSULE | ORAL | Status: AC
  Filled 2021-03-28: qty 1

## 2021-03-28 MED ORDER — DIPHENHYDRAMINE HCL 25 MG PO CAPS
25.0000 mg | ORAL_CAPSULE | ORAL | Status: AC
  Administered 2021-03-28: 25 mg via ORAL

## 2021-03-28 MED ORDER — TAMSULOSIN HCL 0.4 MG PO CAPS: 0.4000 mg | ORAL_CAPSULE | Freq: Every day | ORAL | 0 refills | Status: DC

## 2021-03-28 MED ORDER — DIAZEPAM 5 MG PO TABS
10.0000 mg | ORAL_TABLET | Freq: Once | ORAL | Status: AC
  Administered 2021-03-28: 10 mg via ORAL

## 2021-03-28 MED ORDER — SODIUM CHLORIDE 0.9 % IV SOLN: Freq: Once | INTRAVENOUS | Status: AC

## 2021-03-28 NOTE — Interval H&P Note (Signed)
History and Physical Interval Note:  03/28/2021 8:31 AM  Kenneth Gilbert  has presented today for surgery, with the diagnosis of right ureteral calculus.  The various methods of treatment have been discussed with the patient and family. After consideration of risks, benefits and other options for treatment, the patient has consented to  Procedure(s): EXTRACORPOREAL SHOCK WAVE LITHOTRIPSY (ESWL) (Right) as a surgical intervention.  The patient's history has been reviewed, patient examined, no change in status, stable for surgery.  I have reviewed the patient's chart and labs.  Questions were answered to the patient's satisfaction.     Nicolette Bang

## 2021-03-31 ENCOUNTER — Encounter (HOSPITAL_COMMUNITY): Payer: Self-pay | Admitting: Urology

## 2021-04-04 ENCOUNTER — Ambulatory Visit (INDEPENDENT_AMBULATORY_CARE_PROVIDER_SITE_OTHER): Payer: Medicaid Other | Admitting: Urology

## 2021-04-04 ENCOUNTER — Encounter: Payer: Self-pay | Admitting: Urology

## 2021-04-04 ENCOUNTER — Other Ambulatory Visit: Payer: Self-pay

## 2021-04-04 ENCOUNTER — Ambulatory Visit (HOSPITAL_COMMUNITY)
Admission: RE | Admit: 2021-04-04 | Discharge: 2021-04-04 | Disposition: A | Discharge status: Home / Self Care | Payer: Medicaid Other | Source: Ambulatory Visit | Attending: Urology | Admitting: Urology

## 2021-04-04 VITALS — BP 118/75 | HR 76

## 2021-04-04 DIAGNOSIS — R310 Gross hematuria: Secondary | ICD-10-CM

## 2021-04-04 DIAGNOSIS — N201 Calculus of ureter: Secondary | ICD-10-CM | POA: Insufficient documentation

## 2021-04-04 DIAGNOSIS — N200 Calculus of kidney: Secondary | ICD-10-CM

## 2021-04-04 LAB — URINALYSIS, ROUTINE W REFLEX MICROSCOPIC
Bilirubin, UA: NEGATIVE
Glucose, UA: NEGATIVE
Leukocytes,UA: NEGATIVE
Nitrite, UA: NEGATIVE
Protein,UA: NEGATIVE
Specific Gravity, UA: 1.025 (ref 1.005–1.030)
Urobilinogen, Ur: 1 mg/dL (ref 0.2–1.0)
pH, UA: 5.5 (ref 5.0–7.5)

## 2021-04-04 LAB — MICROSCOPIC EXAMINATION
Epithelial Cells (non renal): NONE SEEN /hpf (ref 0–10)
Renal Epithel, UA: NONE SEEN /hpf
WBC, UA: NONE SEEN /hpf (ref 0–5)

## 2021-04-04 NOTE — Progress Notes (Signed)
Urological Symptom Review  Patient is experiencing the following symptoms: Frequent urination Burning/pain with urination Get up at night to urinate Kidney Stones  Review of Systems  Gastrointestinal (upper)  : Negative for upper GI symptoms  Gastrointestinal (lower) : Negative for lower GI symptoms  Constitutional : Weight loss  Skin: Negative for skin symptoms  Eyes: Blurred vision Double vision  Ear/Nose/Throat : Negative for Ear/Nose/Throat symptoms  Hematologic/Lymphatic: Negative for Hematologic/Lymphatic symptoms  Cardiovascular : Negative for cardiovascular symptoms  Respiratory : Shortness of breath  Endocrine: Negative for endocrine symptoms  Musculoskeletal: Back pain Joint pain  Neurological: Negative for neurological symptoms  Psychologic: Negative for psychiatric symptoms Urological Symptom Review  Patient is experiencing the following symptoms:  Frequent urination Burning/pain with urination Get up at night to urinate Kidney Stones  Review of Systems  Gastrointestinal (upper)  : Negative for upper GI symptoms  Gastrointestinal (lower) : Negative for lower GI symptoms  Constitutional : Weight loss  Skin: Negative for skin symptoms  Eyes: Blurred vision Double vision  Ear/Nose/Throat : Negative for Ear/Nose/Throat symptoms  Hematologic/Lymphatic: Negative for Hematologic/Lymphatic symptoms  Cardiovascular : Negative for cardiovascular symptoms  Respiratory : Shortness of breath  Endocrine: Negative for endocrine symptoms  Musculoskeletal: Back pain Joint pain  Neurological: Dizziness  Psychologic: Negative for psychiatric symptoms

## 2021-04-04 NOTE — Progress Notes (Signed)
Assessment: 1. Ureteral calculus, right   2. Nephrolithiasis   3. Gross hematuria     Plan: I personally reviewed the KUB from today.  The previously seen calcification in the area of the right proximal ureter is no longer visualized.  No obvious calcifications are seen along the expected course of the right ureter. Continue to strain urine.  Return to office in 3-4 weeks with KUB. Stone prevention discussed.  Chief Complaint:  Chief Complaint  Patient presents with   ureteral calculus    History of Present Illness:  Kenneth Gilbert is a 55 y.o. year old male who is seen for further evaluation of a right ureteral calculus.  He was seen by his PCP, Dr. Ronnald Ramp, on 03/07/2021 with a several week history of intermittent gross hematuria.  Urinalysis demonstrated 21-50 RBCs.  He presented to the emergency room on 03/12/2021 with right-sided flank and abdominal pain.  CT imaging showed a 6 mm calculus in the right ureter at the level of L4 with evidence of obstruction and bilateral nephrolithiasis. He continued to have intermittent right sided flank pain.  He was taking pain medication intermittently with relief of his symptoms.  He does have a history of kidney stones but has not required surgical intervention.  He has a history of gross and microscopic hematuria.  He has previously been evaluated by Dr. Jeffie Pollock with CT imaging and cystoscopy.  CT hematuria protocol from 11/21 showed a left extrarenal pelvis, bilateral lower pole calculi, no obstruction or filling defects.  Cystoscopy from 12/21 showed no bladder abnormalities.  He does have a history of tobacco use smoking up to 1-1/2 packs/day for approximately 40 years.  KUB from 03/20/2021 showed a 4 mm calcification to the right of L3 corresponding to the previously noted right ureteral stone.    He is s/p right ESL on 03/28/21.  He has done well following the procedure.  He has not had any flank pain.  He is not aware of passing any  stone fragments.     Portions of the above documentation were copied from a prior visit for review purposes only.   Past Medical History:  Past Medical History:  Diagnosis Date   Arthritis    Dizziness    ETOH abuse    Hard of hearing    Headache    Hyperlipidemia     Past Surgical History:  Past Surgical History:  Procedure Laterality Date   arm surgery Right    arm surgery Left    EXTRACORPOREAL SHOCK WAVE LITHOTRIPSY Right 03/28/2021   Procedure: EXTRACORPOREAL SHOCK WAVE LITHOTRIPSY (ESWL);  Surgeon: Cleon Gustin, MD;  Location: [facility];  Service: Urology;  Laterality: Right;   INNER EAR SURGERY     as a child   SHOULDER SURGERY Left     Allergies:  Allergies  Allergen Reactions   Codeine     REACTION: Dizzy   Gabapentin Other (See Comments)    Burning sensation in stomach   Topamax [Topiramate] Other (See Comments)    Kidney stone/hematuria    Family History:  Family History  Problem Relation Age of Onset   Cancer Father        unknown type   Cirrhosis Sister    Heart disease Mother    Cancer Brother     Social History:  Social History   Tobacco Use   Smoking status: Every Day    Packs/day: 1.00    Types: Cigarettes    Start date: 06/11/1978  Smokeless tobacco: Never  Vaping Use   Vaping Use: Never used  Substance Use Topics   Alcohol use: No    Comment: 01/09/18 - reports no use in 3 years   Drug use: Yes    Types: Marijuana    ROS: Constitutional:  Negative for fever, chills, weight loss CV: Negative for chest pain, previous MI, hypertension Respiratory:  Negative for shortness of breath, wheezing, sleep apnea, frequent cough GI:  Negative for nausea, vomiting, bloody stool, GERD  Physical exam: BP 118/75   Pulse 76  GENERAL APPEARANCE:  Well appearing, well developed, well nourished, NAD HEENT:  Atraumatic, normocephalic, oropharynx clear NECK:  Supple without lymphadenopathy or thyromegaly ABDOMEN:  Soft, non-tender, no  masses EXTREMITIES:  Moves all extremities well, without clubbing, cyanosis, or edema NEUROLOGIC:  Alert and oriented x 3, normal gait, CN II-XII grossly intact MENTAL STATUS:  appropriate BACK:  Non-tender to palpation, No CVAT SKIN:  Warm, dry, and intact   Results: U/A: 3-10 RBC, few bacteria  KUB from today reviewed.  Study shows a nonspecific bowel gas pattern and no obvious bony abnormalities.  A 5 mm calcification is seen in the area of the lower left renal shadow.  No obvious calcifications are seen in the area of the right renal shadow or along the expected course of the right ureter.

## 2021-04-16 ENCOUNTER — Other Ambulatory Visit: Payer: Self-pay | Admitting: *Deleted

## 2021-04-16 DIAGNOSIS — F17210 Nicotine dependence, cigarettes, uncomplicated: Secondary | ICD-10-CM

## 2021-05-01 ENCOUNTER — Encounter: Payer: Self-pay | Admitting: Acute Care

## 2021-05-10 ENCOUNTER — Encounter: Payer: Self-pay | Admitting: Internal Medicine

## 2021-05-10 ENCOUNTER — Ambulatory Visit (INDEPENDENT_AMBULATORY_CARE_PROVIDER_SITE_OTHER): Payer: Medicaid Other | Admitting: Urology

## 2021-05-10 ENCOUNTER — Encounter: Payer: Self-pay | Admitting: Urology

## 2021-05-10 ENCOUNTER — Ambulatory Visit (HOSPITAL_COMMUNITY)
Admission: RE | Admit: 2021-05-10 | Discharge: 2021-05-10 | Disposition: A | Discharge status: Home / Self Care | Payer: Medicaid Other | Source: Ambulatory Visit | Attending: Urology | Admitting: Urology

## 2021-05-10 ENCOUNTER — Other Ambulatory Visit: Payer: Self-pay

## 2021-05-10 VITALS — BP 142/81 | HR 85 | Wt 165.0 lb

## 2021-05-10 DIAGNOSIS — N200 Calculus of kidney: Secondary | ICD-10-CM

## 2021-05-10 DIAGNOSIS — R3129 Other microscopic hematuria: Secondary | ICD-10-CM

## 2021-05-10 DIAGNOSIS — N201 Calculus of ureter: Secondary | ICD-10-CM

## 2021-05-10 LAB — URINALYSIS, ROUTINE W REFLEX MICROSCOPIC
Bilirubin, UA: NEGATIVE
Glucose, UA: NEGATIVE
Leukocytes,UA: NEGATIVE
Nitrite, UA: NEGATIVE
Protein,UA: NEGATIVE
Specific Gravity, UA: >1.03 — ABNORMAL HIGH (ref 1.005–1.030)
Urobilinogen, Ur: 0.2 mg/dL (ref 0.2–1.0)
pH, UA: 5.5 (ref 5.0–7.5)

## 2021-05-10 LAB — MICROSCOPIC EXAMINATION
Renal Epithel, UA: NONE SEEN /hpf
WBC, UA: NONE SEEN /hpf (ref 0–5)

## 2021-05-10 NOTE — Progress Notes (Signed)
Assessment: 1. Ureteral calculus, right   2. Nephrolithiasis, left   3. Microscopic hematuria; evaluation 12/21     Plan: I personally reviewed the KUB study from today.  No obvious calcification seen in the right kidney or along the expected course of the right ureter.  Results discussed with the patient today. Continue stone prevention Renal U/S at [facility] in 1 month Return to office in 6 weeks Will discuss shockwave lithotripsy for the left renal stone at his next visit.  Chief Complaint:  Chief Complaint  Patient presents with   ureteral calculus   History of Present Illness:  Kenneth Gilbert is a 55 y.o. year old male who is seen for further evaluation of a right ureteral calculus.  He was seen by his PCP, Dr. Ronnald Ramp, on 03/07/2021 with a several week history of intermittent gross hematuria.  Urinalysis demonstrated 21-50 RBCs.  He presented to the emergency room on 03/12/2021 with right-sided flank and abdominal pain.  CT imaging showed a 6 mm calculus in the right ureter at the level of L4 with evidence of obstruction and bilateral nephrolithiasis. He continued to have intermittent right sided flank pain.  He was taking pain medication intermittently with relief of his symptoms.  He does have a history of kidney stones but has not required surgical intervention.  He has a history of gross and microscopic hematuria.  He has previously been evaluated by Dr. Jeffie Pollock with CT imaging and cystoscopy.  CT hematuria protocol from 11/21 showed a left extrarenal pelvis, bilateral lower pole calculi, no obstruction or filling defects.  Cystoscopy from 12/21 showed no bladder abnormalities.  He does have a history of tobacco use smoking up to 1-1/2 packs/day for approximately 40 years.  KUB from 03/20/2021 showed a 4 mm calcification to the right of L3 corresponding to the previously noted right ureteral stone.    He underwent right ESL on 03/28/21.  He did well following the procedure.    KUB from 04/04/2021 showed resolution of the previously seen right ureteral calculus and a stable left lower pole calculus. He returns today for follow-up.  He is not having any flank pain.  He has noted some intermittent dysuria.  He is not aware of passing any further stone fragments. IPSS = 3 today.   Portions of the above documentation were copied from a prior visit for review purposes only.   Past Medical History:  Past Medical History:  Diagnosis Date   Arthritis    Dizziness    ETOH abuse    Hard of hearing    Headache    Hyperlipidemia     Past Surgical History:  Past Surgical History:  Procedure Laterality Date   arm surgery Right    arm surgery Left    EXTRACORPOREAL SHOCK WAVE LITHOTRIPSY Right 03/28/2021   Procedure: EXTRACORPOREAL SHOCK WAVE LITHOTRIPSY (ESWL);  Surgeon: Cleon Gustin, MD;  Location: [facility];  Service: Urology;  Laterality: Right;   INNER EAR SURGERY     as a child   SHOULDER SURGERY Left     Allergies:  Allergies  Allergen Reactions   Codeine     REACTION: Dizzy   Gabapentin Other (See Comments)    Burning sensation in stomach   Topamax [Topiramate] Other (See Comments)    Kidney stone/hematuria    Family History:  Family History  Problem Relation Age of Onset   Cancer Father        unknown type   Cirrhosis Sister  Heart disease Mother    Cancer Brother     Social History:  Social History   Tobacco Use   Smoking status: Every Day    Packs/day: 1.00    Types: Cigarettes    Start date: 06/11/1978   Smokeless tobacco: Never  Vaping Use   Vaping Use: Never used  Substance Use Topics   Alcohol use: No    Comment: 01/09/18 - reports no use in 3 years   Drug use: Yes    Types: Marijuana    ROS: Constitutional:  Negative for fever, chills, weight loss CV: Negative for chest pain, previous MI, hypertension Respiratory:  Negative for shortness of breath, wheezing, sleep apnea, frequent cough GI:  Negative for  nausea, vomiting, bloody stool, GERD  Physical exam: BP (!) 142/81    Pulse 85    Wt 165 lb (74.8 kg)    BMI 21.18 kg/m  GENERAL APPEARANCE:  Well appearing, well developed, well nourished, NAD HEENT:  Atraumatic, normocephalic, oropharynx clear NECK:  Supple without lymphadenopathy or thyromegaly ABDOMEN:  Soft, non-tender, no masses EXTREMITIES:  Moves all extremities well, without clubbing, cyanosis, or edema NEUROLOGIC:  Alert and oriented x 3, normal gait, CN II-XII grossly intact MENTAL STATUS:  appropriate BACK:  Non-tender to palpation, No CVAT SKIN:  Warm, dry, and intact   Results: U/A:  2+ blood, 11-30 RBCs, few bacteria

## 2021-05-10 NOTE — Progress Notes (Signed)
Urological Symptom Review  Patient is experiencing the following symptoms: Get up at night to urinate   Review of Systems  Gastrointestinal (upper)  : Negative for upper GI symptoms  Gastrointestinal (lower) : Negative for lower GI symptoms  Constitutional : Weight loss  Skin: Negative for skin symptoms  Eyes: Blurred vision Double vision  Ear/Nose/Throat : Negative for Ear/Nose/Throat symptoms  Hematologic/Lymphatic: Negative for Hematologic/Lymphatic symptoms  Cardiovascular : Negative for cardiovascular symptoms  Respiratory : Negative for respiratory symptoms  Endocrine: Negative for endocrine symptoms  Musculoskeletal: Negative for musculoskeletal symptoms  Neurological: Dizziness  Psychologic: Negative for psychiatric symptoms

## 2021-06-08 ENCOUNTER — Ambulatory Visit: Payer: Medicaid Other | Admitting: Internal Medicine

## 2021-06-14 ENCOUNTER — Ambulatory Visit (HOSPITAL_COMMUNITY): Payer: Medicaid Other

## 2021-06-16 ENCOUNTER — Other Ambulatory Visit: Payer: Self-pay

## 2021-06-16 ENCOUNTER — Ambulatory Visit (HOSPITAL_COMMUNITY)
Admission: RE | Admit: 2021-06-16 | Discharge: 2021-06-16 | Disposition: A | Discharge status: Home / Self Care | Payer: Medicaid Other | Source: Ambulatory Visit | Attending: Internal Medicine | Admitting: Internal Medicine

## 2021-06-16 ENCOUNTER — Ambulatory Visit (HOSPITAL_COMMUNITY)
Admission: RE | Admit: 2021-06-16 | Discharge: 2021-06-16 | Disposition: A | Discharge status: Home / Self Care | Payer: Medicaid Other | Source: Ambulatory Visit | Attending: Urology | Admitting: Urology

## 2021-06-16 DIAGNOSIS — N201 Calculus of ureter: Secondary | ICD-10-CM | POA: Diagnosis present

## 2021-06-16 DIAGNOSIS — F17210 Nicotine dependence, cigarettes, uncomplicated: Secondary | ICD-10-CM | POA: Insufficient documentation

## 2021-06-20 ENCOUNTER — Other Ambulatory Visit: Payer: Self-pay

## 2021-06-20 ENCOUNTER — Ambulatory Visit (AMBULATORY_SURGERY_CENTER): Payer: Medicaid Other

## 2021-06-20 VITALS — Ht 74.0 in | Wt 163.0 lb

## 2021-06-20 DIAGNOSIS — Z86010 Personal history of colonic polyps: Secondary | ICD-10-CM

## 2021-06-20 DIAGNOSIS — Z87891 Personal history of nicotine dependence: Secondary | ICD-10-CM

## 2021-06-20 DIAGNOSIS — F17210 Nicotine dependence, cigarettes, uncomplicated: Secondary | ICD-10-CM

## 2021-06-20 MED ORDER — NA SULFATE-K SULFATE-MG SULF 17.5-3.13-1.6 GM/177ML PO SOLN: 1.0000 | Freq: Once | ORAL | 0 refills | Status: AC

## 2021-06-20 NOTE — Progress Notes (Signed)

## 2021-06-21 ENCOUNTER — Other Ambulatory Visit: Payer: Self-pay | Admitting: Urology

## 2021-06-21 ENCOUNTER — Encounter: Payer: Self-pay | Admitting: Urology

## 2021-06-21 ENCOUNTER — Ambulatory Visit (INDEPENDENT_AMBULATORY_CARE_PROVIDER_SITE_OTHER): Payer: Medicaid Other | Admitting: Urology

## 2021-06-21 VITALS — BP 97/59 | HR 89 | Ht 74.0 in | Wt 163.0 lb

## 2021-06-21 DIAGNOSIS — R3129 Other microscopic hematuria: Secondary | ICD-10-CM

## 2021-06-21 DIAGNOSIS — N200 Calculus of kidney: Secondary | ICD-10-CM

## 2021-06-21 DIAGNOSIS — N201 Calculus of ureter: Secondary | ICD-10-CM

## 2021-06-21 LAB — URINALYSIS, ROUTINE W REFLEX MICROSCOPIC
Bilirubin, UA: NEGATIVE
Glucose, UA: NEGATIVE
Ketones, UA: NEGATIVE
Leukocytes,UA: NEGATIVE
Nitrite, UA: NEGATIVE
Protein,UA: NEGATIVE
Specific Gravity, UA: 1.02 (ref 1.005–1.030)
Urobilinogen, Ur: 0.2 mg/dL (ref 0.2–1.0)
pH, UA: 7 (ref 5.0–7.5)

## 2021-06-21 LAB — MICROSCOPIC EXAMINATION
Bacteria, UA: NONE SEEN
Epithelial Cells (non renal): NONE SEEN /hpf (ref 0–10)
Renal Epithel, UA: NONE SEEN /hpf
WBC, UA: NONE SEEN /hpf (ref 0–5)

## 2021-06-21 NOTE — Progress Notes (Signed)
Assessment: 1. Ureteral calculus, right   2. Nephrolithiasis, left   3. Microscopic hematuria; evaluation 12/21     Plan: Continue stone prevention He is interested in pursuing treatment of his left renal calculus with shockwave lithotripsy.  Procedure: The patient will be scheduled for ESL at [facility].  Surgical request is placed with the surgery schedulers and will be scheduled at the patient's/family request. Informed consent is given as documented below. Anesthesia:  Local with IV sedation  The patient does not have sleep apnea, history of MRSA, history of VRE, history of cardiac device requiring special anesthetic needs. Patient is stable and considered clear for surgical in an outpatient ambulatory surgery setting as well as patient hospital setting.  Consent for Operation or Procedure: Provider Certification I hereby certify that the nature, purpose, benefits, usual and most frequent risks of, and alternatives to, the operation or procedure have been explained to the patient (or person authorized to sign for the patient) either by me as responsible physician or by the provider who is to perform the operation or procedure. Time spent such that the patient/family has had an opportunity to ask questions, and that those questions have been answered. The patient or the patient's representative has been advised that selected tasks may be performed by assistants to the primary health care provider(s). I believe that the patient (or person authorized to sign for the patient) understands what has been explained, and has consented to the operation or procedure. No guarantees were implied or made.  Chief Complaint:  Chief Complaint  Patient presents with   Nephrolithiasis   History of Present Illness:  Kenneth Gilbert is a 56 y.o. year old male who is seen for further evaluation of a right ureteral calculus.  He was seen by his PCP, Dr. Ronnald Ramp, on 03/07/2021 with a several week history  of intermittent gross hematuria.  Urinalysis demonstrated 21-50 RBCs.  He presented to the emergency room on 03/12/2021 with right-sided flank and abdominal pain.  CT imaging showed a 6 mm calculus in the right ureter at the level of L4 with evidence of obstruction and bilateral nephrolithiasis. He continued to have intermittent right sided flank pain.  He was taking pain medication intermittently with relief of his symptoms.  He does have a history of kidney stones but has not required surgical intervention.  He has a history of gross and microscopic hematuria.  He has previously been evaluated by Dr. Jeffie Pollock with CT imaging and cystoscopy.  CT hematuria protocol from 11/21 showed a left extrarenal pelvis, bilateral lower pole calculi, no obstruction or filling defects.  Cystoscopy from 12/21 showed no bladder abnormalities.  He does have a history of tobacco use smoking up to 1-1/2 packs/day for approximately 40 years.  KUB from 03/20/2021 showed a 4 mm calcification to the right of L3 corresponding to the previously noted right ureteral stone.    He underwent right ESL on 03/28/21.  He has done well following the procedure.   KUB from 04/04/2021 showed resolution of the previously seen right ureteral calculus and a stable left lower pole calculus. Renal ultrasound from 06/16/2021 shows no evidence of hydronephrosis or renal mass.  He returns today for follow-up.  He is not having any right-sided flank pain.  No dysuria or gross hematuria.  Portions of the above documentation were copied from a prior visit for review purposes only.   Past Medical History:  Past Medical History:  Diagnosis Date   Arthritis    Dizziness  ETOH abuse    Stopped drinking 4 yrs   Hard of hearing    Headache    Hyperlipidemia     Past Surgical History:  Past Surgical History:  Procedure Laterality Date   arm surgery Right    arm surgery Left    COLONOSCOPY     EXTRACORPOREAL SHOCK WAVE LITHOTRIPSY Right  03/28/2021   Procedure: EXTRACORPOREAL SHOCK WAVE LITHOTRIPSY (ESWL);  Surgeon: Cleon Gustin, MD;  Location: [facility];  Service: Urology;  Laterality: Right;   INNER EAR SURGERY     as a child   SHOULDER SURGERY Left     Allergies:  Allergies  Allergen Reactions   Codeine     REACTION: Dizzy   Gabapentin Other (See Comments)    Burning sensation in stomach   Topamax [Topiramate] Other (See Comments)    Kidney stone/hematuria    Family History:  Family History  Problem Relation Age of Onset   Heart disease Mother    Cancer Father        unknown type   Cirrhosis Sister    Cancer Brother    Colon cancer Neg Hx    Colon polyps Neg Hx    Esophageal cancer Neg Hx    Rectal cancer Neg Hx    Stomach cancer Neg Hx     Social History:  Social History   Tobacco Use   Smoking status: Every Day    Packs/day: 1.00    Types: Cigarettes    Start date: 06/11/1978   Smokeless tobacco: Never  Vaping Use   Vaping Use: Never used  Substance Use Topics   Alcohol use: No    Comment: 01/09/18 - reports no use in 3 years   Drug use: Yes    Types: Marijuana    Comment: occa    ROS: Constitutional:  Negative for fever, chills, weight loss: CV: Negative for chest pain, previous MI, hypertension Respiratory:  Negative for shortness of breath, wheezing, sleep apnea, frequent cough GI:  Negative for nausea, vomiting, bloody stool, GERD  Physical exam: BP (!) 97/59    Pulse 89    Ht 6\' 2"  (1.88 m)    Wt 163 lb (73.9 kg)    BMI 20.93 kg/m  GENERAL APPEARANCE:  Well appearing, well developed, well nourished, NAD HEENT:  Atraumatic, normocephalic, oropharynx clear NECK:  Supple without lymphadenopathy or thyromegaly ABDOMEN:  Soft, non-tender, no masses EXTREMITIES:  Moves all extremities well, without clubbing, cyanosis, or edema NEUROLOGIC:  Alert and oriented x 3, normal gait, CN II-XII grossly intact MENTAL STATUS:  appropriate BACK:  Non-tender to palpation, No CVAT SKIN:   Warm, dry, and intact   Results: U/A: 3-10 RBC

## 2021-06-21 NOTE — Progress Notes (Signed)
Surgical Physician Order Form [facility] [place]  * Scheduling expectation : in next 30 days  *Length of Case: 60 minutes  *MD Preforming Case: Michaelle Birks, MD  *Assistant Needed: no  *Facility Preference: [facility]  *Clearance needed: no  *Anticoagulation Instructions: N/A  *Aspirin Instructions: N/A  -Admit type: OUTpatient  -Anesthesia: Local  -Use Standing Orders: ESWL  *Diagnosis: Left Nephrolithiasis  *Procedure: left  ESL  Additional orders: N/A  -Equipment:  none -VTE Prophylaxis Standing Order SCDs       Other:   -Standing Lab Orders Per Anesthesia    Lab other: KUB day of Procedure  -Standing Test orders EKG/Chest x-ray per Anesthesia       Test other:   - Medications:   none  -Other orders:  N/A  *Post-op visit Date/Instructions:  1-2 week with KUB prior

## 2021-06-21 NOTE — H&P (View-Only) (Signed)
Assessment: 1. Ureteral calculus, right   2. Nephrolithiasis, left   3. Microscopic hematuria; evaluation 12/21     Plan: Continue stone prevention He is interested in pursuing treatment of his left renal calculus with shockwave lithotripsy.  Procedure: The patient will be scheduled for ESL at [facility].  Surgical request is placed with the surgery schedulers and will be scheduled at the patient's/family request. Informed consent is given as documented below. Anesthesia:  Local with IV sedation  The patient does not have sleep apnea, history of MRSA, history of VRE, history of cardiac device requiring special anesthetic needs. Patient is stable and considered clear for surgical in an outpatient ambulatory surgery setting as well as patient hospital setting.  Consent for Operation or Procedure: Provider Certification I hereby certify that the nature, purpose, benefits, usual and most frequent risks of, and alternatives to, the operation or procedure have been explained to the patient (or person authorized to sign for the patient) either by me as responsible physician or by the provider who is to perform the operation or procedure. Time spent such that the patient/family has had an opportunity to ask questions, and that those questions have been answered. The patient or the patient's representative has been advised that selected tasks may be performed by assistants to the primary health care provider(s). I believe that the patient (or person authorized to sign for the patient) understands what has been explained, and has consented to the operation or procedure. No guarantees were implied or made.  Chief Complaint:  Chief Complaint  Patient presents with   Nephrolithiasis   History of Present Illness:  Kenneth Gilbert is a 56 y.o. year old male who is seen for further evaluation of a right ureteral calculus.  He was seen by his PCP, Dr. Ronnald Ramp, on 03/07/2021 with a several week history  of intermittent gross hematuria.  Urinalysis demonstrated 21-50 RBCs.  He presented to the emergency room on 03/12/2021 with right-sided flank and abdominal pain.  CT imaging showed a 6 mm calculus in the right ureter at the level of L4 with evidence of obstruction and bilateral nephrolithiasis. He continued to have intermittent right sided flank pain.  He was taking pain medication intermittently with relief of his symptoms.  He does have a history of kidney stones but has not required surgical intervention.  He has a history of gross and microscopic hematuria.  He has previously been evaluated by Dr. Jeffie Pollock with CT imaging and cystoscopy.  CT hematuria protocol from 11/21 showed a left extrarenal pelvis, bilateral lower pole calculi, no obstruction or filling defects.  Cystoscopy from 12/21 showed no bladder abnormalities.  He does have a history of tobacco use smoking up to 1-1/2 packs/day for approximately 40 years.  KUB from 03/20/2021 showed a 4 mm calcification to the right of L3 corresponding to the previously noted right ureteral stone.    He underwent right ESL on 03/28/21.  He has done well following the procedure.   KUB from 04/04/2021 showed resolution of the previously seen right ureteral calculus and a stable left lower pole calculus. Renal ultrasound from 06/16/2021 shows no evidence of hydronephrosis or renal mass.  He returns today for follow-up.  He is not having any right-sided flank pain.  No dysuria or gross hematuria.  Portions of the above documentation were copied from a prior visit for review purposes only.   Past Medical History:  Past Medical History:  Diagnosis Date   Arthritis    Dizziness  ETOH abuse    Stopped drinking 4 yrs   Hard of hearing    Headache    Hyperlipidemia     Past Surgical History:  Past Surgical History:  Procedure Laterality Date   arm surgery Right    arm surgery Left    COLONOSCOPY     EXTRACORPOREAL SHOCK WAVE LITHOTRIPSY Right  03/28/2021   Procedure: EXTRACORPOREAL SHOCK WAVE LITHOTRIPSY (ESWL);  Surgeon: Cleon Gustin, MD;  Location: [facility];  Service: Urology;  Laterality: Right;   INNER EAR SURGERY     as a child   SHOULDER SURGERY Left     Allergies:  Allergies  Allergen Reactions   Codeine     REACTION: Dizzy   Gabapentin Other (See Comments)    Burning sensation in stomach   Topamax [Topiramate] Other (See Comments)    Kidney stone/hematuria    Family History:  Family History  Problem Relation Age of Onset   Heart disease Mother    Cancer Father        unknown type   Cirrhosis Sister    Cancer Brother    Colon cancer Neg Hx    Colon polyps Neg Hx    Esophageal cancer Neg Hx    Rectal cancer Neg Hx    Stomach cancer Neg Hx     Social History:  Social History   Tobacco Use   Smoking status: Every Day    Packs/day: 1.00    Types: Cigarettes    Start date: 06/11/1978   Smokeless tobacco: Never  Vaping Use   Vaping Use: Never used  Substance Use Topics   Alcohol use: No    Comment: 01/09/18 - reports no use in 3 years   Drug use: Yes    Types: Marijuana    Comment: occa    ROS: Constitutional:  Negative for fever, chills, weight loss: CV: Negative for chest pain, previous MI, hypertension Respiratory:  Negative for shortness of breath, wheezing, sleep apnea, frequent cough GI:  Negative for nausea, vomiting, bloody stool, GERD  Physical exam: BP (!) 97/59    Pulse 89    Ht 6\' 2"  (1.88 m)    Wt 163 lb (73.9 kg)    BMI 20.93 kg/m  GENERAL APPEARANCE:  Well appearing, well developed, well nourished, NAD HEENT:  Atraumatic, normocephalic, oropharynx clear NECK:  Supple without lymphadenopathy or thyromegaly ABDOMEN:  Soft, non-tender, no masses EXTREMITIES:  Moves all extremities well, without clubbing, cyanosis, or edema NEUROLOGIC:  Alert and oriented x 3, normal gait, CN II-XII grossly intact MENTAL STATUS:  appropriate BACK:  Non-tender to palpation, No CVAT SKIN:   Warm, dry, and intact   Results: U/A: 3-10 RBC

## 2021-06-22 ENCOUNTER — Telehealth: Payer: Self-pay | Admitting: Acute Care

## 2021-06-22 DIAGNOSIS — M4802 Spinal stenosis, cervical region: Secondary | ICD-10-CM | POA: Insufficient documentation

## 2021-06-23 NOTE — Telephone Encounter (Signed)
Contacted patient to review results of LDCT.  No suspicious or concerning lung nodules. Plan for 12 month repeat CT.  Plaque in the coronary arteries was noted, as well a kidney stones and emphysema.  Patient was aware of COPD and kidney stones.  He also states he was previously on a statin but stopped it. He plans to call his PCP today to discuss the atherosclerosis and review recommendations for care and treatment.  Patient is also interested in medication assistance to quit smoking.  Had talked with pharmacy regarding this in 2021 but he did not continue with it.  Will assist patient with connecting with pharmacist for further discussion.   Patient acknowledged understanding and had no further questions.

## 2021-06-25 NOTE — Progress Notes (Signed)
Subjective:    Patient ID: Kenneth Gilbert, male    DOB: Apr 05, 1966, 56 y.o.   MRN: [id number]  This visit occurred during the SARS-CoV-2 public health emergency.  Safety protocols were in place, including screening questions prior to the visit, additional usage of staff PPE, and extensive cleaning of exam room while observing appropriate contact time as indicated for disinfecting solutions.    HPI The patient is here for an acute visit for lung plaque build up and BP check.  He recently had his annual CT scan for lung cancer screening.  He was advised to follow-up here because of noted coronary artery atherosclerosis.  He is in need of back surgery, but is not able to have back surgery until he quit smoking.  He had quit, but restarted.  He wondered about taking Chantix again-that has worked for him in the past and he tolerated it well.   Medications and allergies reviewed with patient and updated if appropriate.  Patient Active Problem List   Diagnosis Date Noted   Ureteral calculus, right 03/21/2021   Dizziness 03/09/2021   Chronic cough 03/07/2021   Benign prostatic hyperplasia with urinary hesitancy 03/07/2021   Gross hematuria 03/17/2020   LVH (left ventricular hypertrophy) 82/42/3536   Diastolic dysfunction without heart failure 11/03/2019   Neuromyelopathy due to vitamin B12 deficiency ([place]) 07/17/2018   Other chronic sinusitis 06/11/2018   Vertebral artery stenosis 12/11/2017   Sensorineural hearing loss (SNHL), bilateral 12/03/2017   Aortic atherosclerosis ([place]) 07/26/2017   Aortic ectasia, abdominal ([place]) 07/26/2017   Chronic vascular disorder of intestine ([place]) 06/12/2017   Hyperlipidemia with target LDL less than 100 06/12/2017   Tobacco abuse 06/11/2017   Nephrolithiasis 02/20/2013   GERD 07/12/2009    Current Outpatient Medications on File Prior to Visit  Medication Sig Dispense Refill   HYDROcodone-acetaminophen (NORCO/VICODIN) 5-325 MG tablet Take 1  tablet by mouth every 6 (six) hours as needed for severe pain. (Patient not taking: Reported on 06/27/2021) 30 tablet 0   Na Sulfate-K Sulfate-Mg Sulf 17.5-3.13-1.6 GM/177ML SOLN Take by mouth. (Patient not taking: Reported on 06/27/2021)     No current facility-administered medications on file prior to visit.    Past Medical History:  Diagnosis Date   Arthritis    Dizziness    ETOH abuse    Stopped drinking 4 yrs   Hard of hearing    Headache    Hyperlipidemia     Past Surgical History:  Procedure Laterality Date   arm surgery Right    arm surgery Left    COLONOSCOPY     EXTRACORPOREAL SHOCK WAVE LITHOTRIPSY Right 03/28/2021   Procedure: EXTRACORPOREAL SHOCK WAVE LITHOTRIPSY (ESWL);  Surgeon: Cleon Gustin, MD;  Location: [facility];  Service: Urology;  Laterality: Right;   INNER EAR SURGERY     as a child   SHOULDER SURGERY Left     Social History   Socioeconomic History   Marital status: Divorced    Spouse name: Not on file   Number of children: 4   Years of education: 12   Highest education level: High school graduate  Occupational History   Occupation: car inspections, oil changes, rotates tires  Tobacco Use   Smoking status: Every Day    Packs/day: 1.00    Types: Cigarettes    Start date: 06/11/1978   Smokeless tobacco: Never  Vaping Use   Vaping Use: Never used  Substance and Sexual Activity   Alcohol use: No  Comment: 01/09/18 - reports no use in 3 years   Drug use: Yes    Types: Marijuana    Comment: occa   Sexual activity: Yes    Partners: Female  Other Topics Concern   Not on file  Social History Narrative   Lives at home with his son.   Caffeine use:  Drinks 3-4 cans of [place] tea (20oz cans)   Right-handed.   Social Determinants of Health   Financial Resource Strain: Not on file  Food Insecurity: Not on file  Transportation Needs: Not on file  Physical Activity: Not on file  Stress: Not on file  Social Connections: Not on file     Family History  Problem Relation Age of Onset   Heart disease Mother    Cancer Father        unknown type   Cirrhosis Sister    Cancer Brother    Colon cancer Neg Hx    Colon polyps Neg Hx    Esophageal cancer Neg Hx    Rectal cancer Neg Hx    Stomach cancer Neg Hx     Review of Systems  Constitutional:  Negative for chills and fever.  Respiratory:  Positive for cough (little) and shortness of breath. Negative for wheezing.   Cardiovascular:  Positive for chest pain (from smoking - only has it when he smokes). Negative for palpitations and leg swelling.  Neurological:  Positive for dizziness.      Objective:   Vitals:   06/27/21 0908  BP: 100/80  Pulse: 71  Temp: 98 F (36.7 C)  SpO2: 99%   BP Readings from Last 3 Encounters:  06/27/21 100/80  06/21/21 (!) 97/59  05/10/21 (!) 142/81   Wt Readings from Last 3 Encounters:  06/27/21 165 lb (74.8 kg)  06/21/21 163 lb (73.9 kg)  06/20/21 163 lb (73.9 kg)   Body mass index is 21.18 kg/m.   Physical Exam    Constitutional: Appears well-developed and well-nourished. No distress.  Head: Normocephalic and atraumatic.  Neck: Neck supple. No tracheal deviation present. No thyromegaly present.  No cervical lymphadenopathy Cardiovascular: Normal rate, regular rhythm and normal heart sounds.  No murmur heard. No carotid bruit .  No edema Pulmonary/Chest: Effort normal and breath sounds normal. No respiratory distress. No has no wheezes. No rales.  Skin: Skin is warm and dry. Not diaphoretic.  Psychiatric: Normal mood and affect. Behavior is normal.       Assessment & Plan:    Coronary artery and aortic atherosclerosis: Chronic He has a known aortic atherosclerosis and on CT scan showed coronary artery atherosclerosis and he was advised to follow-up here Was on a statin at 1 time, but is not currently taking 1 Advise restarting the statin on a daily basis and stressed smoking cessation Start Crestor 5 mg  daily Check hepatic function, lipid panel in about 6 weeks   Tobacco dependence due to cigarettes: He is actively smoking, but does want to quit-we will see because he needs surgery and he also knows he needs to quit Getting annual CT scans for lung cancer screening Has known coronary and aortic atherosclerosis and emphysema He understands the importance of smoking cessation He has taken Chantix in the past and has tolerated it well Start Chantix 0.5 mg twice daily-after the first month he will call to go on the higher dose

## 2021-06-27 ENCOUNTER — Telehealth: Payer: Self-pay | Admitting: Acute Care

## 2021-06-27 ENCOUNTER — Other Ambulatory Visit: Payer: Self-pay

## 2021-06-27 ENCOUNTER — Ambulatory Visit (INDEPENDENT_AMBULATORY_CARE_PROVIDER_SITE_OTHER): Payer: Medicaid Other | Admitting: Internal Medicine

## 2021-06-27 ENCOUNTER — Encounter: Payer: Self-pay | Admitting: Internal Medicine

## 2021-06-27 VITALS — BP 100/80 | HR 71 | Temp 98.0°F | Ht 74.0 in | Wt 165.0 lb

## 2021-06-27 DIAGNOSIS — E7849 Other hyperlipidemia: Secondary | ICD-10-CM

## 2021-06-27 DIAGNOSIS — F17210 Nicotine dependence, cigarettes, uncomplicated: Secondary | ICD-10-CM

## 2021-06-27 MED ORDER — VARENICLINE TARTRATE 0.5 MG PO TABS: 0.5000 mg | ORAL_TABLET | Freq: Two times a day (BID) | ORAL | 0 refills | Status: DC

## 2021-06-27 MED ORDER — ROSUVASTATIN CALCIUM 5 MG PO TABS: 5.0000 mg | ORAL_TABLET | Freq: Every day | ORAL | 1 refills | Status: DC

## 2021-06-27 NOTE — Telephone Encounter (Signed)
This pt was originally referred for smoking cessation back in 2021 but didn't complete it. He is now interested in getting help with smoking cessation/ meds. Could reach out to pt to discuss?

## 2021-06-27 NOTE — Patient Instructions (Addendum)
° ° °  Blood work was ordered.  Have this done in about 6 weeks to recheck your cholesterol.     Medications changes include :   chantix 0.5 mg twice daily for one month  - call for a new prescription for after the first month.   Start crestor for your cholesterol.    Your prescription(s) have been submitted to your pharmacy. Please take as directed and contact our office if you believe you are having problem(s) with the medication(s).

## 2021-07-04 ENCOUNTER — Other Ambulatory Visit: Payer: Self-pay

## 2021-07-04 ENCOUNTER — Encounter: Payer: Self-pay | Admitting: Internal Medicine

## 2021-07-04 ENCOUNTER — Ambulatory Visit (AMBULATORY_SURGERY_CENTER): Payer: Medicaid Other | Admitting: Internal Medicine

## 2021-07-04 VITALS — BP 100/69 | HR 66 | Temp 98.4°F | Resp 13 | Ht 74.0 in | Wt 163.0 lb

## 2021-07-04 DIAGNOSIS — D120 Benign neoplasm of cecum: Secondary | ICD-10-CM | POA: Diagnosis not present

## 2021-07-04 DIAGNOSIS — Z86010 Personal history of colonic polyps: Secondary | ICD-10-CM | POA: Diagnosis not present

## 2021-07-04 DIAGNOSIS — D124 Benign neoplasm of descending colon: Secondary | ICD-10-CM

## 2021-07-04 DIAGNOSIS — D123 Benign neoplasm of transverse colon: Secondary | ICD-10-CM | POA: Diagnosis not present

## 2021-07-04 DIAGNOSIS — D122 Benign neoplasm of ascending colon: Secondary | ICD-10-CM | POA: Diagnosis not present

## 2021-07-04 MED ORDER — SODIUM CHLORIDE 0.9 % IV SOLN: 500.0000 mL | Freq: Once | INTRAVENOUS | Status: DC

## 2021-07-04 NOTE — Progress Notes (Signed)
A and O x3. Report to RN. Tolerated MAC anesthesia well.

## 2021-07-04 NOTE — Op Note (Signed)
[name]: Kenneth Gilbert Procedure Date: 07/04/2021 8:28 AM MRN: [id number] Endoscopist: Jerene Bears , MD Age: 56 Referring MD:  Date of Birth: 1965-11-19 Gender: Male Account #: [id number] Procedure:                Colonoscopy Indications:              High risk colon cancer surveillance: Personal                            history of numerous lifetime adenomas, Last                            colonoscopy: January 2019 Medicines:                Monitored Anesthesia Care Procedure:                Pre-Anesthesia Assessment:                           - Prior to the procedure, a History and Physical                            was performed, and patient medications and                            allergies were reviewed. The patient's tolerance of                            previous anesthesia was also reviewed. The risks                            and benefits of the procedure and the sedation                            options and risks were discussed with the patient.                            All questions were answered, and informed consent                            was obtained. Prior Anticoagulants: The patient has                            taken no previous anticoagulant or antiplatelet                            agents. ASA Grade Assessment: II - A patient with                            mild systemic disease. After reviewing the risks                            and benefits, the patient was deemed in  satisfactory condition to undergo the procedure.                           After obtaining informed consent, the colonoscope                            was passed under direct vision. Throughout the                            procedure, the patient's blood pressure, pulse, and                            oxygen saturations were monitored continuously. The                            CF [id number] #[id number] was introduced through the  anus                            and advanced to the cecum, identified by                            appendiceal orifice and ileocecal valve. The                            colonoscopy was performed without difficulty. The                            patient tolerated the procedure well. The quality                            of the bowel preparation was good. The ileocecal                            valve, appendiceal orifice, and rectum were                            photographed. Scope In: 8:43:01 AM Scope Out: 9:06:24 AM Scope Withdrawal Time: 0 hours 21 minutes 45 seconds  Total Procedure Duration: 0 hours 23 minutes 23 seconds  Findings:                 The digital rectal exam was normal.                           Two sessile polyps were found in the cecum. The                            polyps were 3 to 5 mm in size. These polyps were                            removed with a cold snare. Resection and retrieval                            were complete.  Four sessile polyps were found in the ascending                            colon. The polyps were 3 to 9 mm in size. These                            polyps were removed with a cold snare. Resection                            and retrieval were complete.                           Four sessile polyps were found in the transverse                            colon. The polyps were 3 to 10 mm in size. These                            polyps were removed with a cold snare. Resection                            and retrieval were complete.                           A 3 mm polyp was found in the descending colon. The                            polyp was sessile. The polyp was removed with a                            cold snare. Resection and retrieval were complete.                           The retroflexed view of the distal rectum and anal                            verge was normal and showed no anal or rectal                             abnormalities. Complications:            No immediate complications. Estimated Blood Loss:     Estimated blood loss was minimal. Impression:               - Two 3 to 5 mm polyps in the cecum, removed with a                            cold snare. Resected and retrieved.                           - Four 3 to 9 mm polyps in the ascending colon,  removed with a cold snare. Resected and retrieved.                           - Four 3 to 10 mm polyps in the transverse colon,                            removed with a cold snare. Resected and retrieved.                           - One 3 mm polyp in the descending colon, removed                            with a cold snare. Resected and retrieved.                           - The distal rectum and anal verge are normal on                            retroflexion view. Recommendation:           - Patient has a contact number available for                            emergencies. The signs and symptoms of potential                            delayed complications were discussed with the                            patient. Return to normal activities tomorrow.                            Written discharge instructions were provided to the                            patient.                           - Continue present medications.                           - Resume previous diet.                           - Await pathology results.                           - Referral to medical genetics for evaluation given                            personal history of multiple lifetime adenomas of                            the colon.                           -  Repeat colonoscopy is recommended for                            surveillance. The colonoscopy date will be                            determined after pathology results from today's                            exam become available for review. Jerene Bears,  MD 07/04/2021 9:12:42 AM This report has been signed electronically.

## 2021-07-04 NOTE — Progress Notes (Signed)
GASTROENTEROLOGY PROCEDURE H&P NOTE   Primary Care Physician: Janith Lima, MD    Reason for Procedure:   Hx of numerous adenomas of the colon  Plan:    colonoscopy  Patient is appropriate for endoscopic procedure(s) in the ambulatory ([facility]) setting.  The nature of the procedure, as well as the risks, benefits, and alternatives were carefully and thoroughly reviewed with the patient. Ample time for discussion and questions allowed. The patient understood, was satisfied, and agreed to proceed.     HPI: Kenneth Gilbert is a 56 y.o. male who presents for colonoscopy.  Medical history as below.  Tolerated the prep.  No recent chest pain or shortness of breath.  No abdominal pain today.  Past Medical History:  Diagnosis Date   Arthritis    Dizziness    ETOH abuse    Stopped drinking 4 yrs   Hard of hearing    Headache    Hyperlipidemia     Past Surgical History:  Procedure Laterality Date   arm surgery Right    arm surgery Left    COLONOSCOPY     EXTRACORPOREAL SHOCK WAVE LITHOTRIPSY Right 03/28/2021   Procedure: EXTRACORPOREAL SHOCK WAVE LITHOTRIPSY (ESWL);  Surgeon: Cleon Gustin, MD;  Location: [facility];  Service: Urology;  Laterality: Right;   INNER EAR SURGERY     as a child   SHOULDER SURGERY Left     Prior to Admission medications   Medication Sig Start Date End Date Taking? Authorizing Provider  rosuvastatin (CRESTOR) 5 MG tablet Take 1 tablet (5 mg total) by mouth daily. 06/27/21  Yes Burns, Claudina Lick, MD  varenicline (CHANTIX) 0.5 MG tablet Take 1 tablet (0.5 mg total) by mouth 2 (two) times daily. 06/27/21  Yes Binnie Rail, MD    Current Outpatient Medications  Medication Sig Dispense Refill   rosuvastatin (CRESTOR) 5 MG tablet Take 1 tablet (5 mg total) by mouth daily. 90 tablet 1   varenicline (CHANTIX) 0.5 MG tablet Take 1 tablet (0.5 mg total) by mouth 2 (two) times daily. 60 tablet 0   Current Facility-Administered Medications  Medication  Dose Route Frequency Provider Last Rate Last Admin   0.9 %  sodium chloride infusion  500 mL Intravenous Once [name], Lajuan Lines, MD        Allergies as of 07/04/2021 - Review Complete 07/04/2021  Allergen Reaction Noted   Codeine  08/18/2009   Gabapentin Other (See Comments) 10/09/2019   Topamax [topiramate] Other (See Comments) 11/20/2018    Family History  Problem Relation Age of Onset   Heart disease Mother    Cancer Father        unknown type   Cirrhosis Sister    Cancer Brother    Colon cancer Neg Hx    Colon polyps Neg Hx    Esophageal cancer Neg Hx    Rectal cancer Neg Hx    Stomach cancer Neg Hx     Social History   Socioeconomic History   Marital status: Divorced    Spouse name: Not on file   Number of children: 4   Years of education: 12   Highest education level: High school graduate  Occupational History   Occupation: car inspections, oil changes, rotates tires  Tobacco Use   Smoking status: Every Day    Packs/day: 1.00    Types: Cigarettes    Start date: 06/11/1978   Smokeless tobacco: Never  Vaping Use   Vaping Use: Never used  Substance and Sexual Activity   Alcohol use: No    Comment: 01/09/18 - reports no use in 3 years   Drug use: Yes    Types: Marijuana    Comment: occa   Sexual activity: Yes    Partners: Female  Other Topics Concern   Not on file  Social History Narrative   Lives at home with his son.   Caffeine use:  Drinks 3-4 cans of [place] tea (20oz cans)   Right-handed.   Social Determinants of Health   Financial Resource Strain: Not on file  Food Insecurity: Not on file  Transportation Needs: Not on file  Physical Activity: Not on file  Stress: Not on file  Social Connections: Not on file  Intimate Partner Violence: Not on file    Physical Exam: Vital signs in last 24 hours: @BP  109/68    Pulse 74    Temp 98.4 F (36.9 C) (Temporal)    Ht 6\' 2"  (1.88 m)    Wt 163 lb (73.9 kg)    SpO2 97%    BMI 20.93 kg/m  GEN: NAD EYE:  Sclerae anicteric ENT: MMM CV: Non-tachycardic Pulm: CTA b/l GI: Soft, NT/ND NEURO:  Alert & Oriented x 3   Zenovia Jarred, MD [facility]  07/04/2021 8:32 AM

## 2021-07-04 NOTE — Progress Notes (Signed)
VS-AG ° °Pt's states no medical or surgical changes since previsit or office visit. ° °

## 2021-07-04 NOTE — Patient Instructions (Signed)
Handout on polyps given. ° °YOU HAD AN ENDOSCOPIC PROCEDURE TODAY AT THE [facility]:   Refer to the procedure report that was given to you for any specific questions about what was found during the examination.  If the procedure report does not answer your questions, please call your gastroenterologist to clarify.  If you requested that your care partner not be given the details of your procedure findings, then the procedure report has been included in a sealed envelope for you to review at your convenience later. ° °YOU SHOULD EXPECT: Some feelings of bloating in the abdomen. Passage of more gas than usual.  Walking can help get rid of the air that was put into your GI tract during the procedure and reduce the bloating. If you had a lower endoscopy (such as a colonoscopy or flexible sigmoidoscopy) you may notice spotting of blood in your stool or on the toilet paper. If you underwent a bowel prep for your procedure, you may not have a normal bowel movement for a few days. ° °Please Note:  You might notice some irritation and congestion in your nose or some drainage.  This is from the oxygen used during your procedure.  There is no need for concern and it should clear up in a day or so. ° °SYMPTOMS TO REPORT IMMEDIATELY: ° °Following lower endoscopy (colonoscopy or flexible sigmoidoscopy): ° Excessive amounts of blood in the stool ° Significant tenderness or worsening of abdominal pains ° Swelling of the abdomen that is new, acute ° Fever of 100°F or higher ° °For urgent or emergent issues, a gastroenterologist can be reached at any hour by calling [phone]. °Do not use MyChart messaging for urgent concerns.  ° ° °DIET:  We do recommend a small meal at first, but then you may proceed to your regular diet.  Drink plenty of fluids but you should avoid alcoholic beverages for 24 hours. ° °ACTIVITY:  You should plan to take it easy for the rest of today and you should NOT DRIVE or use heavy machinery  until tomorrow (because of the sedation medicines used during the test).   ° °FOLLOW UP: °Our staff will call the number listed on your records 48-72 hours following your procedure to check on you and address any questions or concerns that you may have regarding the information given to you following your procedure. If we do not reach you, we will leave a message.  We will attempt to reach you two times.  During this call, we will ask if you have developed any symptoms of COVID 19. If you develop any symptoms (ie: fever, flu-like symptoms, shortness of breath, cough etc.) before then, please call [phone].  If you test positive for Covid 19 in the 2 weeks post procedure, please call and report this information to us.   ° °If any biopsies were taken you will be contacted by phone or by letter within the next 1-3 weeks.  Please call us at [phone] if you have not heard about the biopsies in 3 weeks.  ° ° °SIGNATURES/CONFIDENTIALITY: °You and/or your care partner have signed paperwork which will be entered into your electronic medical record.  These signatures attest to the fact that that the information above on your After Visit Summary has been reviewed and is understood.  Full responsibility of the confidentiality of this discharge information lies with you and/or your care-partner.  °

## 2021-07-05 ENCOUNTER — Other Ambulatory Visit: Payer: Self-pay

## 2021-07-05 DIAGNOSIS — Z86010 Personal history of colonic polyps: Secondary | ICD-10-CM

## 2021-07-06 ENCOUNTER — Telehealth: Payer: Self-pay | Admitting: *Deleted

## 2021-07-06 ENCOUNTER — Encounter (HOSPITAL_COMMUNITY)
Admission: RE | Admit: 2021-07-06 | Discharge: 2021-07-06 | Disposition: A | Discharge status: Home / Self Care | Payer: Medicaid Other | Source: Ambulatory Visit | Attending: Urology | Admitting: Urology

## 2021-07-06 NOTE — Telephone Encounter (Signed)
°  Follow up Call-  Call back number 07/04/2021  Post procedure Call Back phone  # [phone]  Permission to leave phone message Yes  Some recent data might be hidden     Patient questions: Message left to call us if necessary.

## 2021-07-06 NOTE — Telephone Encounter (Signed)
°  Follow up Call-  Call back number 07/04/2021  Post procedure Call Back phone  # [phone]  Permission to leave phone message Yes  Some recent data might be hidden   [facility]

## 2021-07-07 ENCOUNTER — Telehealth: Payer: Self-pay | Admitting: Genetic Counselor

## 2021-07-07 ENCOUNTER — Telehealth: Payer: Self-pay

## 2021-07-07 MED ORDER — VARENICLINE TARTRATE 1 MG PO TABS: 1.0000 mg | ORAL_TABLET | Freq: Two times a day (BID) | ORAL | 2 refills | Status: DC

## 2021-07-07 NOTE — Telephone Encounter (Signed)
Spoke with patient and info given 

## 2021-07-07 NOTE — Telephone Encounter (Signed)
Pt called saying that he has to talk to Dr. Quay Burow today.  Pt is calling about a varenicline (CHANTIX) 0.5 MG tablet that was prescribed on the 06/27/21.  Pt states that the medication isn't working he is still smoking. Pt is asking for the brand name and the dosage to be increase.   Pt is requesting the same medication and dosage that he had in the past.  Pt is requesting a CB at [phone]

## 2021-07-07 NOTE — Telephone Encounter (Signed)
Scheduled appt per 2/22 referral. Pt is aware of appt date and time. Pt is aware to arrive 15 mins prior to appt time and to bring and updated insurance card. Pt is aware of appt location.   

## 2021-07-07 NOTE — Telephone Encounter (Signed)
Generic Chantix 1 mg sent to [facility]-I would recommend you try the generic since his insurance company will not pay for the brand since there is a generic.

## 2021-07-10 ENCOUNTER — Encounter: Payer: Self-pay | Admitting: Internal Medicine

## 2021-07-10 ENCOUNTER — Telehealth: Payer: Self-pay

## 2021-07-10 NOTE — Telephone Encounter (Signed)
Patient was contacted to screen for telephone-based smoking cessation services. However, patient did not answer and a HIPAA compliant voicemail was left.   Joseph Art, Pharm.D. PGY-1 Pharmacy Resident 07/10/2021 3:28 PM

## 2021-07-10 NOTE — Telephone Encounter (Signed)
Referral pending.

## 2021-07-11 ENCOUNTER — Ambulatory Visit (HOSPITAL_COMMUNITY)
Admission: RE | Admit: 2021-07-11 | Discharge: 2021-07-11 | Disposition: A | Discharge status: Home / Self Care | Payer: Medicaid Other | Attending: Urology | Admitting: Urology

## 2021-07-11 ENCOUNTER — Encounter (HOSPITAL_COMMUNITY): Payer: Self-pay | Admitting: Urology

## 2021-07-11 ENCOUNTER — Encounter (HOSPITAL_COMMUNITY)
Admission: RE | Disposition: A | Discharge status: Home / Self Care | Payer: Self-pay | Source: Home / Self Care | Attending: Urology

## 2021-07-11 ENCOUNTER — Ambulatory Visit (HOSPITAL_COMMUNITY)
Admission: RE | Admit: 2021-07-11 | Discharge: 2021-07-11 | Disposition: A | Discharge status: Home / Self Care | Payer: Medicaid Other | Source: Home / Self Care | Attending: Urology | Admitting: Urology

## 2021-07-11 DIAGNOSIS — N200 Calculus of kidney: Secondary | ICD-10-CM | POA: Insufficient documentation

## 2021-07-11 DIAGNOSIS — F17210 Nicotine dependence, cigarettes, uncomplicated: Secondary | ICD-10-CM | POA: Diagnosis not present

## 2021-07-11 HISTORY — PX: EXTRACORPOREAL SHOCK WAVE LITHOTRIPSY: SHX1557

## 2021-07-11 SURGERY — LITHOTRIPSY, ESWL
Anesthesia: LOCAL | Laterality: Left

## 2021-07-11 MED ORDER — DIPHENHYDRAMINE HCL 25 MG PO CAPS
25.0000 mg | ORAL_CAPSULE | ORAL | Status: AC
  Administered 2021-07-11: 25 mg via ORAL
  Filled 2021-07-11: qty 1

## 2021-07-11 MED ORDER — DIAZEPAM 5 MG PO TABS
10.0000 mg | ORAL_TABLET | Freq: Once | ORAL | Status: AC
  Administered 2021-07-11: 10 mg via ORAL
  Filled 2021-07-11: qty 2

## 2021-07-11 MED ORDER — HYDROCODONE-ACETAMINOPHEN 5-325 MG PO TABS: 1.0000 | ORAL_TABLET | Freq: Four times a day (QID) | ORAL | 0 refills | Status: AC | PRN

## 2021-07-11 MED ORDER — SODIUM CHLORIDE 0.9 % IV SOLN: INTRAVENOUS | Status: DC

## 2021-07-11 MED ORDER — TAMSULOSIN HCL 0.4 MG PO CAPS: 0.4000 mg | ORAL_CAPSULE | Freq: Every day | ORAL | 1 refills | Status: DC

## 2021-07-11 NOTE — Interval H&P Note (Signed)
History and Physical Interval Note:  07/11/2021 7:21 AM  Kenneth Gilbert  has presented today for surgery, with the diagnosis of Left Nephrolithiasis.  The various methods of treatment have been discussed with the patient and family. After consideration of risks, benefits and other options for treatment, the patient has consented to  Procedure(s): EXTRACORPOREAL SHOCK WAVE LITHOTRIPSY (ESWL) (Left) as a surgical intervention.  The patient's history has been reviewed, patient examined, no change in status, stable for surgery.  I have reviewed the patient's chart and labs.  Questions were answered to the patient's satisfaction.     Michaelle Birks

## 2021-07-14 ENCOUNTER — Encounter (HOSPITAL_COMMUNITY): Payer: Self-pay | Admitting: Urology

## 2021-07-20 ENCOUNTER — Ambulatory Visit (HOSPITAL_COMMUNITY)
Admission: RE | Admit: 2021-07-20 | Discharge: 2021-07-20 | Disposition: A | Discharge status: Home / Self Care | Payer: Medicaid Other | Source: Ambulatory Visit | Attending: Urology | Admitting: Urology

## 2021-07-20 ENCOUNTER — Ambulatory Visit (INDEPENDENT_AMBULATORY_CARE_PROVIDER_SITE_OTHER): Payer: Medicaid Other | Admitting: Urology

## 2021-07-20 ENCOUNTER — Other Ambulatory Visit: Payer: Self-pay

## 2021-07-20 ENCOUNTER — Encounter: Payer: Self-pay | Admitting: Urology

## 2021-07-20 VITALS — BP 101/55 | HR 85

## 2021-07-20 DIAGNOSIS — N200 Calculus of kidney: Secondary | ICD-10-CM

## 2021-07-20 NOTE — Progress Notes (Signed)
? ?Assessment: ?1. Nephrolithiasis   ? ? ?Plan: ?KUB from today reviewed.  No obvious calcification seen in the area of the left renal shadow or along the expected course of the left ureter. ?Return to office in 1 month with previsit KUB. ? ?Chief Complaint:  ?Chief Complaint  ?Patient presents with  ? Nephrolithiasis  ? ?History of Present Illness: ? ?Kenneth Gilbert is a 56 y.o. year old male who is seen for further evaluation of a right ureteral calculus.  He was seen by his PCP, Dr. Ronnald Ramp, on 03/07/2021 with a several week history of intermittent gross hematuria.  Urinalysis demonstrated 21-50 RBCs.  He presented to the emergency room on 03/12/2021 with right-sided flank and abdominal pain.  CT imaging showed a 6 mm calculus in the right ureter at the level of L4 with evidence of obstruction and bilateral nephrolithiasis. ?He continued to have intermittent right sided flank pain.  He was taking pain medication intermittently with relief of his symptoms.  He does have a history of kidney stones but has not required surgical intervention. ? ?He has a history of gross and microscopic hematuria.  He has previously been evaluated by Dr. Jeffie Pollock with CT imaging and cystoscopy.  CT hematuria protocol from 11/21 showed a left extrarenal pelvis, bilateral lower pole calculi, no obstruction or filling defects.  Cystoscopy from 12/21 showed no bladder abnormalities. ? ?He does have a history of tobacco use smoking up to 1-1/2 packs/day for approximately 40 years. ? ?KUB from 03/20/2021 showed a 4 mm calcification to the right of L3 corresponding to the previously noted right ureteral stone.   ? ?He underwent right ESL on 03/28/21.  He has done well following the procedure.   ?KUB from 04/04/2021 showed resolution of the previously seen right ureteral calculus and a stable left lower pole calculus. ?Renal ultrasound from 06/16/2021 shows no evidence of hydronephrosis or renal mass. ? ?She is status post left ESL on  07/11/2021. ?He has done very well since the procedure.  He did have some gross hematuria for approximately 1 week.  This has resolved.  He has had no flank pain.  No dysuria, fever, chills, nausea, or vomiting.  He has not been straining his urine on a regular basis. ? ?Portions of the above documentation were copied from a prior visit for review purposes only. ? ? ?Past Medical History:  ?Past Medical History:  ?Diagnosis Date  ? Arthritis   ? Dizziness   ? ETOH abuse   ? Stopped drinking 4 yrs  ? Hard of hearing   ? Headache   ? Hyperlipidemia   ? ? ?Past Surgical History:  ?Past Surgical History:  ?Procedure Laterality Date  ? arm surgery Right   ? arm surgery Left   ? COLONOSCOPY    ? EXTRACORPOREAL SHOCK WAVE LITHOTRIPSY Right 03/28/2021  ? Procedure: EXTRACORPOREAL SHOCK WAVE LITHOTRIPSY (ESWL);  Surgeon: Cleon Gustin, MD;  Location: [facility];  Service: Urology;  Laterality: Right;  ? EXTRACORPOREAL SHOCK WAVE LITHOTRIPSY Left 07/11/2021  ? Procedure: EXTRACORPOREAL SHOCK WAVE LITHOTRIPSY (ESWL);  Surgeon: Primus Bravo., MD;  Location: [facility];  Service: Urology;  Laterality: Left;  ? INNER EAR SURGERY    ? as a child  ? SHOULDER SURGERY Left   ? ? ?Allergies:  ?Allergies  ?Allergen Reactions  ? Codeine   ?  REACTION: Dizzy  ? Gabapentin Other (See Comments)  ?  Burning sensation in stomach  ? Topamax [Topiramate] Other (See Comments)  ?  Kidney stone/hematuria  ? ? ?Family History:  ?Family History  ?Problem Relation Age of Onset  ? Heart disease Mother   ? Cancer Father   ?     unknown type  ? Cirrhosis Sister   ? Cancer Brother   ? Colon cancer Neg Hx   ? Colon polyps Neg Hx   ? Esophageal cancer Neg Hx   ? Rectal cancer Neg Hx   ? Stomach cancer Neg Hx   ? ? ?Social History:  ?Social History  ? ?Tobacco Use  ? Smoking status: Every Day  ?  Packs/day: 1.00  ?  Types: Cigarettes  ?  Start date: 06/11/1978  ? Smokeless tobacco: Never  ?Vaping Use  ? Vaping Use: Never used  ?Substance Use Topics  ?  Alcohol use: No  ?  Comment: 01/09/18 - reports no use in 3 years  ? Drug use: Yes  ?  Types: Marijuana  ?  Comment: occa  ? ? ?ROS: ?Constitutional:  Negative for fever, chills, weight loss ?CV: Negative for chest pain, previous MI, hypertension ?Respiratory:  Negative for shortness of breath, wheezing, sleep apnea, frequent cough ?GI:  Negative for nausea, vomiting, bloody stool, GERD ? ?Physical exam: ?BP (!) 101/55   Pulse 85  ?GENERAL APPEARANCE:  Well appearing, well developed, well nourished, NAD ?HEENT:  Atraumatic, normocephalic, oropharynx clear ?NECK:  Supple without lymphadenopathy or thyromegaly ?ABDOMEN:  Soft, non-tender, no masses ?EXTREMITIES:  Moves all extremities well, without clubbing, cyanosis, or edema ?NEUROLOGIC:  Alert and oriented x 3, normal gait, CN II-XII grossly intact ?MENTAL STATUS:  appropriate ?BACK:  Non-tender to palpation, No CVAT ?SKIN:  Warm, dry, and intact ? ? ?Results: ?U/A: 11-30 RBC, few bacteria ?

## 2021-07-21 LAB — URINALYSIS, ROUTINE W REFLEX MICROSCOPIC
Bilirubin, UA: NEGATIVE
Glucose, UA: NEGATIVE
Ketones, UA: NEGATIVE
Leukocytes,UA: NEGATIVE
Nitrite, UA: NEGATIVE
Protein,UA: NEGATIVE
Specific Gravity, UA: 1.02 (ref 1.005–1.030)
Urobilinogen, Ur: 1 mg/dL (ref 0.2–1.0)
pH, UA: 7 (ref 5.0–7.5)

## 2021-07-21 LAB — MICROSCOPIC EXAMINATION
Renal Epithel, UA: NONE SEEN /hpf
WBC, UA: NONE SEEN /hpf (ref 0–5)

## 2021-07-24 ENCOUNTER — Ambulatory Visit: Payer: Medicaid Other | Admitting: Internal Medicine

## 2021-07-28 ENCOUNTER — Telehealth: Payer: Self-pay

## 2021-07-28 NOTE — Telephone Encounter (Signed)
Patient was contacted regarding smoking cessation services. He states he is currently on generic Chantix and this has not been helping. He was previously on brand Chantix which helped him quit smoking. Will look into Chantix coupon cards and reach back out to patient.  ? ?Joseph Art, Pharm.D. ?PGY-1 Pharmacy Resident ?07/28/2021 1:45 PM ? ? ?

## 2021-08-01 ENCOUNTER — Inpatient Hospital Stay: Payer: Medicaid Other | Admitting: Genetic Counselor

## 2021-08-01 ENCOUNTER — Inpatient Hospital Stay: Payer: Medicaid Other

## 2021-08-08 NOTE — Telephone Encounter (Signed)
Pt called back and is asking about vouchers for Chantix. Pt can be reached at [phone].  ?

## 2021-08-10 ENCOUNTER — Other Ambulatory Visit (INDEPENDENT_AMBULATORY_CARE_PROVIDER_SITE_OTHER): Payer: Medicaid Other

## 2021-08-10 DIAGNOSIS — E7849 Other hyperlipidemia: Secondary | ICD-10-CM | POA: Diagnosis not present

## 2021-08-10 LAB — LIPID PANEL
Cholesterol: 142 mg/dL (ref 0–200)
HDL: 38.4 mg/dL — ABNORMAL LOW (ref >39.00)
LDL Cholesterol: 80 mg/dL (ref 0–99)
NonHDL: 103.34
Total CHOL/HDL Ratio: 4
Triglycerides: 119 mg/dL (ref 0.0–149.0)
VLDL: 23.8 mg/dL (ref 0.0–40.0)

## 2021-08-10 LAB — HEPATIC FUNCTION PANEL
ALT: 25 U/L (ref 0–53)
AST: 18 U/L (ref 0–37)
Albumin: 4.6 g/dL (ref 3.5–5.2)
Alkaline Phosphatase: 85 U/L (ref 39–117)
Bilirubin, Direct: 0 mg/dL (ref 0.0–0.3)
Total Bilirubin: 0.4 mg/dL (ref 0.2–1.2)
Total Protein: 7.2 g/dL (ref 6.0–8.3)

## 2021-08-15 ENCOUNTER — Ambulatory Visit (HOSPITAL_COMMUNITY)
Admission: RE | Admit: 2021-08-15 | Discharge: 2021-08-15 | Disposition: A | Discharge status: Home / Self Care | Payer: Medicaid Other | Source: Ambulatory Visit | Attending: Urology | Admitting: Urology

## 2021-08-15 ENCOUNTER — Other Ambulatory Visit: Payer: Self-pay

## 2021-08-15 ENCOUNTER — Encounter: Payer: Self-pay | Admitting: Urology

## 2021-08-15 ENCOUNTER — Ambulatory Visit (INDEPENDENT_AMBULATORY_CARE_PROVIDER_SITE_OTHER): Payer: Medicaid Other | Admitting: Urology

## 2021-08-15 VITALS — BP 105/74 | HR 92 | Ht 74.0 in | Wt 163.0 lb

## 2021-08-15 DIAGNOSIS — N200 Calculus of kidney: Secondary | ICD-10-CM | POA: Diagnosis present

## 2021-08-15 DIAGNOSIS — R3129 Other microscopic hematuria: Secondary | ICD-10-CM

## 2021-08-15 LAB — URINALYSIS, ROUTINE W REFLEX MICROSCOPIC
Bilirubin, UA: NEGATIVE
Glucose, UA: NEGATIVE
Ketones, UA: NEGATIVE
Leukocytes,UA: NEGATIVE
Nitrite, UA: NEGATIVE
Protein,UA: NEGATIVE
Specific Gravity, UA: 1.015 (ref 1.005–1.030)
Urobilinogen, Ur: 0.2 mg/dL (ref 0.2–1.0)
pH, UA: 6 (ref 5.0–7.5)

## 2021-08-15 LAB — MICROSCOPIC EXAMINATION
Bacteria, UA: NONE SEEN
Epithelial Cells (non renal): NONE SEEN /hpf (ref 0–10)
Renal Epithel, UA: NONE SEEN /hpf
WBC, UA: NONE SEEN /hpf (ref 0–5)

## 2021-08-15 NOTE — Progress Notes (Signed)
? ?Assessment: ?1. Nephrolithiasis   ?2. Microscopic hematuria; evaluation 12/21   ? ? ?Plan: ?KUB from today reviewed.  No obvious calcification seen in the area of the left kidney or along the expected course of the left ureter. ?Schedule for renal ultrasound at [facility] in the next month.  We will call him with results. ?Stone prevention discussed.  Information provided. ?Return to office in 6 months ? ?Chief Complaint:  ?Chief Complaint  ?Patient presents with  ? Nephrolithiasis  ? ?History of Present Illness: ? ?Kenneth Gilbert is a 56 y.o. year old male who is seen for further evaluation of a right ureteral calculus.  He was seen by his PCP, Dr. Ronnald Ramp, on 03/07/2021 with a several week history of intermittent gross hematuria.  Urinalysis demonstrated 21-50 RBCs.  He presented to the emergency room on 03/12/2021 with right-sided flank and abdominal pain.  CT imaging showed a 6 mm calculus in the right ureter at the level of L4 with evidence of obstruction and bilateral nephrolithiasis. ?He continued to have intermittent right sided flank pain.  He was taking pain medication intermittently with relief of his symptoms.  He does have a history of kidney stones but has not required surgical intervention. ? ?He has a history of gross and microscopic hematuria.  He has previously been evaluated by Dr. Jeffie Pollock with CT imaging and cystoscopy.  CT hematuria protocol from 11/21 showed a left extrarenal pelvis, bilateral lower pole calculi, no obstruction or filling defects.  Cystoscopy from 12/21 showed no bladder abnormalities. ? ?He does have a history of tobacco use smoking up to 1-1/2 packs/day for approximately 40 years. ? ?PSA from 10/22: 0.68 ? ?KUB from 03/20/2021 showed a 4 mm calcification to the right of L3 corresponding to the previously noted right ureteral stone.   ? ?He underwent right ESL on 03/28/21.  He did well following the procedure.   ?KUB from 04/04/2021 showed resolution of the previously seen  right ureteral calculus and a stable left lower pole calculus. ?Renal ultrasound from 06/16/2021 shows no evidence of hydronephrosis or renal mass. ? ?He is status post left ESL on 07/11/2021. ?He returns today for follow-up.  He continues to do very well.  No dysuria or gross hematuria.  No flank pain.  He is not aware of passing any additional stone fragments since his last visit. ? ?Portions of the above documentation were copied from a prior visit for review purposes only. ? ? ?Past Medical History:  ?Past Medical History:  ?Diagnosis Date  ? Arthritis   ? Dizziness   ? ETOH abuse   ? Stopped drinking 4 yrs  ? Hard of hearing   ? Headache   ? Hyperlipidemia   ? ? ?Past Surgical History:  ?Past Surgical History:  ?Procedure Laterality Date  ? arm surgery Right   ? arm surgery Left   ? COLONOSCOPY    ? EXTRACORPOREAL SHOCK WAVE LITHOTRIPSY Right 03/28/2021  ? Procedure: EXTRACORPOREAL SHOCK WAVE LITHOTRIPSY (ESWL);  Surgeon: Cleon Gustin, MD;  Location: [facility];  Service: Urology;  Laterality: Right;  ? EXTRACORPOREAL SHOCK WAVE LITHOTRIPSY Left 07/11/2021  ? Procedure: EXTRACORPOREAL SHOCK WAVE LITHOTRIPSY (ESWL);  Surgeon: Primus Bravo., MD;  Location: [facility];  Service: Urology;  Laterality: Left;  ? INNER EAR SURGERY    ? as a child  ? SHOULDER SURGERY Left   ? ? ?Allergies:  ?Allergies  ?Allergen Reactions  ? Codeine   ?  REACTION: Dizzy  ? Gabapentin  Other (See Comments)  ?  Burning sensation in stomach  ? Topamax [Topiramate] Other (See Comments)  ?  Kidney stone/hematuria  ? ? ?Family History:  ?Family History  ?Problem Relation Age of Onset  ? Heart disease Mother   ? Cancer Father   ?     unknown type  ? Cirrhosis Sister   ? Cancer Brother   ? Colon cancer Neg Hx   ? Colon polyps Neg Hx   ? Esophageal cancer Neg Hx   ? Rectal cancer Neg Hx   ? Stomach cancer Neg Hx   ? ? ?Social History:  ?Social History  ? ?Tobacco Use  ? Smoking status: Every Day  ?  Packs/day: 1.00  ?  Types: Cigarettes  ?   Start date: 06/11/1978  ? Smokeless tobacco: Never  ?Vaping Use  ? Vaping Use: Never used  ?Substance Use Topics  ? Alcohol use: No  ?  Comment: 01/09/18 - reports no use in 3 years  ? Drug use: Yes  ?  Types: Marijuana  ?  Comment: occa  ? ? ?ROS: ?Constitutional:  Negative for fever, chills, weight loss ?CV: Negative for chest pain, previous MI, hypertension ?Respiratory:  Negative for shortness of breath, wheezing, sleep apnea, frequent cough ?GI:  Negative for nausea, vomiting, bloody stool, GERD ? ?Physical exam: ?BP 105/74   Pulse 92   Ht '6\' 2"'$  (1.88 m)   Wt 163 lb (73.9 kg)   BMI 20.93 kg/m?  ?GENERAL APPEARANCE:  Well appearing, well developed, well nourished, NAD ?HEENT:  Atraumatic, normocephalic, oropharynx clear ?NECK:  Supple without lymphadenopathy or thyromegaly ?ABDOMEN:  Soft, non-tender, no masses ?EXTREMITIES:  Moves all extremities well, without clubbing, cyanosis, or edema ?NEUROLOGIC:  Alert and oriented x 3, normal gait, CN II-XII grossly intact ?MENTAL STATUS:  appropriate ?BACK:  Non-tender to palpation, No CVAT ?SKIN:  Warm, dry, and intact ? ? ?Results: ?U/A: 0-2 RBC ?

## 2021-10-06 ENCOUNTER — Emergency Department (HOSPITAL_COMMUNITY)
Admission: EM | Admit: 2021-10-06 | Discharge: 2021-10-06 | Disposition: A | Discharge status: Home / Self Care | Payer: Medicaid Other | Attending: Emergency Medicine | Admitting: Emergency Medicine

## 2021-10-06 ENCOUNTER — Other Ambulatory Visit: Payer: Self-pay

## 2021-10-06 ENCOUNTER — Encounter (HOSPITAL_COMMUNITY): Payer: Self-pay

## 2021-10-06 DIAGNOSIS — R110 Nausea: Secondary | ICD-10-CM | POA: Insufficient documentation

## 2021-10-06 DIAGNOSIS — R519 Headache, unspecified: Secondary | ICD-10-CM | POA: Diagnosis not present

## 2021-10-06 MED ORDER — ONDANSETRON HCL 4 MG PO TABS: 4.0000 mg | ORAL_TABLET | Freq: Four times a day (QID) | ORAL | 0 refills | Status: DC

## 2021-10-06 MED ORDER — ONDANSETRON 8 MG PO TBDP
8.0000 mg | ORAL_TABLET | Freq: Once | ORAL | Status: DC
  Filled 2021-10-06: qty 1

## 2021-10-06 NOTE — ED Triage Notes (Signed)
Pt arrived from home with c/o chronic pressure in his head that has gotten worse since Tuesday. Says that he has had neurology tests done in the past but still has no relief.

## 2021-10-06 NOTE — ED Provider Notes (Signed)
[facility] EMERGENCY DEPARTMENT Provider Note   CSN: [id number] Arrival date & time: 10/06/21  0543     History  Chief Complaint  Patient presents with   Headache    Kenneth Gilbert is a 56 y.o. male.  Patient presents to the emergency department for evaluation of head pressure and nausea.  Patient reports that this has been ongoing for a long time.  He reports that everything started approximately 5 years ago when he was working outside and got overheated.  Since then he has intermittent episodes of pressure in his head, nausea, sensation of hot followed by cold and generalized weakness.  Feels like he is going to pass out when this occurs.  He has been seen at [facility] for the symptoms, has seen countless doctors and has not been given a reason.      Home Medications Prior to Admission medications   Medication Sig Start Date End Date Taking? Authorizing Provider  ondansetron (ZOFRAN) 4 MG tablet Take 1 tablet (4 mg total) by mouth every 6 (six) hours. 10/06/21  Yes Alma Mohiuddin, Gwenyth Allegra, MD  HYDROcodone-acetaminophen (NORCO/VICODIN) 5-325 MG tablet Take 1 tablet by mouth every 6 (six) hours as needed for moderate pain. 07/11/21 07/11/22  Stoneking, Reece Leader., MD  rosuvastatin (CRESTOR) 5 MG tablet Take 1 tablet (5 mg total) by mouth daily. 06/27/21   Binnie Rail, MD  tamsulosin (FLOMAX) 0.4 MG CAPS capsule Take 1 capsule (0.4 mg total) by mouth daily. Patient not taking: Reported on 08/15/2021 07/11/21   Primus Bravo., MD  varenicline (CHANTIX CONTINUING MONTH PAK) 1 MG tablet Take 1 tablet (1 mg total) by mouth 2 (two) times daily. 07/07/21   Binnie Rail, MD      Allergies    Codeine, Gabapentin, and Topamax [topiramate]    Review of Systems   Review of Systems  Physical Exam Updated Vital Signs BP 100/70 (BP Location: Left Arm)   Pulse 84   Temp 98.2 F (36.8 C)   Resp 18   Ht '6\' 2"'$  (1.88 m)   Wt 72.6 kg   SpO2 99%   BMI 20.54 kg/m  Physical  Exam Vitals and nursing note reviewed.  Constitutional:      General: He is not in acute distress.    Appearance: He is well-developed.  HENT:     Head: Normocephalic and atraumatic.     Mouth/Throat:     Mouth: Mucous membranes are moist.  Eyes:     General: Vision grossly intact. Gaze aligned appropriately.     Extraocular Movements: Extraocular movements intact.     Conjunctiva/sclera: Conjunctivae normal.  Cardiovascular:     Rate and Rhythm: Normal rate and regular rhythm.     Pulses: Normal pulses.     Heart sounds: Normal heart sounds, S1 normal and S2 normal. No murmur heard.   No friction rub. No gallop.  Pulmonary:     Effort: Pulmonary effort is normal. No respiratory distress.     Breath sounds: Normal breath sounds.  Abdominal:     Palpations: Abdomen is soft.     Tenderness: There is no abdominal tenderness. There is no guarding or rebound.     Hernia: No hernia is present.  Musculoskeletal:        General: No swelling.     Cervical back: Full passive range of motion without pain, normal range of motion and neck supple. No pain with movement, spinous process tenderness or muscular tenderness. Normal range  of motion.     Right lower leg: No edema.     Left lower leg: No edema.  Skin:    General: Skin is warm and dry.     Capillary Refill: Capillary refill takes less than 2 seconds.     Findings: No ecchymosis, erythema, lesion or wound.  Neurological:     Mental Status: He is alert and oriented to person, place, and time.     GCS: GCS eye subscore is 4. GCS verbal subscore is 5. GCS motor subscore is 6.     Cranial Nerves: Cranial nerves 2-12 are intact.     Sensory: Sensation is intact.     Motor: Motor function is intact. No weakness or abnormal muscle tone.     Coordination: Coordination is intact.  Psychiatric:        Mood and Affect: Mood normal.        Speech: Speech normal.        Behavior: Behavior normal.    ED Results / Procedures / Treatments    Labs (all labs ordered are listed, but only abnormal results are displayed) Labs Reviewed - No data to display  EKG None  Radiology No results found.  Procedures Procedures    Medications Ordered in ED Medications  ondansetron (ZOFRAN-ODT) disintegrating tablet 8 mg (has no administration in time range)    ED Course/ Medical Decision Making/ A&P                           Medical Decision Making  Presents to the emergency department for evaluation of head pressure and nausea.  Patient has been having these episodes for many years.  I did review his outside records.  Patient has been seen at [facility] and at [facility] by neurology and other specialist including ENT and orthopedics.  No clear explanation has been found.  Patient has a normal, nonfocal neurologic exam.  Reviewing his records reveals he has had MRIs, MRAs, EEG.  I do not see any imaging or testing at this time that would be helpful in this patient who has 5 years plus of recurrent symptoms.  Patient is mainly complaining of nausea currently he has not had any vomiting associated.  Abdominal exam is benign.  No associated chest pain, heart palpitations, shortness of breath.  We will provide him with antiemetic, encouraged him to follow-up with his neurologist at [facility].        Final Clinical Impression(s) / ED Diagnoses Final diagnoses:  Nausea    Rx / DC Orders ED Discharge Orders          Ordered    ondansetron (ZOFRAN) 4 MG tablet  Every 6 hours        10/06/21 0637              Orpah Greek, MD 10/06/21 [phone]

## 2021-10-11 ENCOUNTER — Telehealth: Payer: Self-pay
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
## 2021-11-20 ENCOUNTER — Other Ambulatory Visit: Payer: Self-pay

## 2021-11-20 ENCOUNTER — Encounter (HOSPITAL_COMMUNITY): Payer: Self-pay

## 2021-11-20 ENCOUNTER — Emergency Department (HOSPITAL_COMMUNITY)
Admission: EM | Admit: 2021-11-20 | Discharge: 2021-11-20 | Disposition: A | Discharge status: Home / Self Care | Payer: Medicaid Other | Attending: Student | Admitting: Student

## 2021-11-20 ENCOUNTER — Emergency Department (HOSPITAL_COMMUNITY): Payer: Medicaid Other

## 2021-11-20 DIAGNOSIS — X58XXXA Exposure to other specified factors, initial encounter: Secondary | ICD-10-CM | POA: Diagnosis not present

## 2021-11-20 DIAGNOSIS — M5412 Radiculopathy, cervical region: Secondary | ICD-10-CM | POA: Insufficient documentation

## 2021-11-20 DIAGNOSIS — M7021 Olecranon bursitis, right elbow: Secondary | ICD-10-CM | POA: Diagnosis not present

## 2021-11-20 DIAGNOSIS — M7989 Other specified soft tissue disorders: Secondary | ICD-10-CM | POA: Diagnosis present

## 2021-11-20 DIAGNOSIS — G8929 Other chronic pain: Secondary | ICD-10-CM | POA: Diagnosis not present

## 2021-11-20 DIAGNOSIS — Y9389 Activity, other specified: Secondary | ICD-10-CM | POA: Diagnosis not present

## 2021-11-20 MED ORDER — PREDNISONE 10 MG PO TABS: ORAL_TABLET | ORAL | 0 refills | Status: DC

## 2021-11-20 MED ORDER — OXYCODONE-ACETAMINOPHEN 5-325 MG PO TABS: 1.0000 | ORAL_TABLET | Freq: Four times a day (QID) | ORAL | 0 refills | Status: DC | PRN

## 2021-11-20 NOTE — Discharge Instructions (Addendum)
Take the entire course of the prednisone, this medication is to help reduce the swelling at your elbow.  Avoid direct pressure to the site as this can make this bursitis persist or even enlarge further.  Ice packs can also help to reduce the swelling.  Do not drive within 4 hours of taking oxycodone as this medication will make you drowsy.  Apply an Ace wrap for gentle compression can also be helpful.

## 2021-11-20 NOTE — ED Triage Notes (Addendum)
Pt reports right arm pain including shoulder, elbow, and wrist, pt does have mild swelling to elbow and wrist- denies injury, says pain and swelling started Sat night. Pt reports he supposed to have cervical surgery for bone spurs and nerve compression, but not yet scheduled at [place] says he had hydrocodone from previous kidney stones that he took for this pain, but took the last one last night. Says two of these eased his pain (5/'325mg'$ ), asking if he can get something a little stronger.
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
## 2021-12-07 ENCOUNTER — Encounter: Payer: Self-pay | Admitting: Internal Medicine

## 2021-12-07 ENCOUNTER — Ambulatory Visit (INDEPENDENT_AMBULATORY_CARE_PROVIDER_SITE_OTHER): Payer: Medicaid Other

## 2021-12-07 ENCOUNTER — Ambulatory Visit: Payer: Medicaid Other | Admitting: Internal Medicine

## 2021-12-07 VITALS — BP 122/62 | HR 71 | Temp 98.1°F | Ht 74.0 in | Wt 153.0 lb

## 2021-12-07 DIAGNOSIS — R0989 Other specified symptoms and signs involving the circulatory and respiratory systems: Secondary | ICD-10-CM

## 2021-12-07 DIAGNOSIS — T402X5A Adverse effect of other opioids, initial encounter: Secondary | ICD-10-CM

## 2021-12-07 DIAGNOSIS — K5909 Other constipation: Secondary | ICD-10-CM | POA: Diagnosis not present

## 2021-12-07 DIAGNOSIS — E538 Deficiency of other specified B group vitamins: Secondary | ICD-10-CM | POA: Diagnosis not present

## 2021-12-07 DIAGNOSIS — G320 Subacute combined degeneration of spinal cord in diseases classified elsewhere: Secondary | ICD-10-CM | POA: Diagnosis not present

## 2021-12-07 DIAGNOSIS — R10817 Generalized abdominal tenderness: Secondary | ICD-10-CM | POA: Diagnosis not present

## 2021-12-07 DIAGNOSIS — K5903 Drug induced constipation: Secondary | ICD-10-CM

## 2021-12-07 LAB — CBC WITH DIFFERENTIAL/PLATELET
Basophils Absolute: 0 10*3/uL (ref 0.0–0.1)
Basophils Relative: 0.9 % (ref 0.0–3.0)
Eosinophils Absolute: 0.1 10*3/uL (ref 0.0–0.7)
Eosinophils Relative: 3.6 % (ref 0.0–5.0)
HCT: 37.4 % — ABNORMAL LOW (ref 39.0–52.0)
Hemoglobin: 12.7 g/dL — ABNORMAL LOW (ref 13.0–17.0)
Lymphocytes Relative: 60.1 % — ABNORMAL HIGH (ref 12.0–46.0)
Lymphs Abs: 2 10*3/uL (ref 0.7–4.0)
MCHC: 33.9 g/dL (ref 30.0–36.0)
MCV: 89.4 fl (ref 78.0–100.0)
Monocytes Absolute: 0.3 10*3/uL (ref 0.1–1.0)
Monocytes Relative: 8.9 % (ref 3.0–12.0)
Neutro Abs: 0.9 10*3/uL — ABNORMAL LOW (ref 1.4–7.7)
Neutrophils Relative %: 26.5 % — ABNORMAL LOW (ref 43.0–77.0)
Platelets: 275 10*3/uL (ref 150.0–400.0)
RBC: 4.19 Mil/uL — ABNORMAL LOW (ref 4.22–5.81)
RDW: 14 % (ref 11.5–15.5)
WBC: 3.3 10*3/uL — ABNORMAL LOW (ref 4.0–10.5)

## 2021-12-07 LAB — FOLATE: Folate: 14.4 ng/mL (ref >5.9)

## 2021-12-07 MED ORDER — CYANOCOBALAMIN 1000 MCG/ML IJ SOLN
1000.0000 ug | Freq: Once | INTRAMUSCULAR | Status: AC
  Administered 2021-12-07: 1000 ug via INTRAMUSCULAR

## 2021-12-07 MED ORDER — RELISTOR 150 MG PO TABS: 3.0000 | ORAL_TABLET | Freq: Every day | ORAL | 1 refills | Status: DC

## 2021-12-07 NOTE — Patient Instructions (Signed)

## 2021-12-07 NOTE — Progress Notes (Signed)
Subjective:  Patient ID: Kenneth Gilbert, male    DOB: 1965-08-22  Age: 56 y.o. MRN: [id number]  CC: Abdominal Pain   HPI STEN DEMATTEO presents for f/up -  He complains of constipation and abdominal cramps.  The only way he has been able to have a bowel movement is with prune juice.  He has prescriptions for both hydrocodone and oxycodone.  He has had some nausea but denies vomiting, cough, chest pain, fever, or chills.  He continues to complain of paresthesias.  History Tiger has a past medical history of Arthritis, Dizziness, ETOH abuse, Hard of hearing, Headache, and Hyperlipidemia.   He has a past surgical history that includes arm surgery (Right); arm surgery (Left); Shoulder surgery (Left); Inner ear surgery; Extracorporeal shock wave lithotripsy (Right, 03/28/2021); Colonoscopy; and Extracorporeal shock wave lithotripsy (Left, 07/11/2021).   His family history includes Cancer in his brother and father; Cirrhosis in his sister; Heart disease in his mother.He reports that he has quit smoking. His smoking use included cigarettes. He started smoking about 43 years ago. He smoked an average of 1 pack per day. He has never used smokeless tobacco. He reports current drug use. Drug: Marijuana. He reports that he does not drink alcohol.  Outpatient Medications Prior to Visit  Medication Sig Dispense Refill   HYDROcodone-acetaminophen (NORCO/VICODIN) 5-325 MG tablet Take 1 tablet by mouth every 6 (six) hours as needed for moderate pain. 15 tablet 0   ondansetron (ZOFRAN) 4 MG tablet Take 1 tablet (4 mg total) by mouth every 6 (six) hours. 20 tablet 0   oxyCODONE-acetaminophen (PERCOCET/ROXICET) 5-325 MG tablet Take 1 tablet by mouth every 6 (six) hours as needed. 20 tablet 0   predniSONE (DELTASONE) 10 MG tablet 6, 5, 4, 3, 2 then 1 tablet by mouth daily for 6 days total. 21 tablet 0   rosuvastatin (CRESTOR) 5 MG tablet Take 1 tablet (5 mg total) by mouth daily. 90 tablet 1   tamsulosin  (FLOMAX) 0.4 MG CAPS capsule Take 1 capsule (0.4 mg total) by mouth daily. 14 capsule 1   varenicline (CHANTIX CONTINUING MONTH PAK) 1 MG tablet Take 1 tablet (1 mg total) by mouth 2 (two) times daily. 60 tablet 2   No facility-administered medications prior to visit.    ROS Review of Systems  Constitutional:  Negative for chills, diaphoresis, fatigue and fever.  HENT: Negative.    Eyes: Negative.   Respiratory:  Negative for cough, chest tightness, shortness of breath and wheezing.   Cardiovascular:  Negative for chest pain, palpitations and leg swelling.  Gastrointestinal:  Positive for abdominal pain and constipation. Negative for blood in stool, nausea and vomiting.  Endocrine: Negative.   Genitourinary: Negative.  Negative for difficulty urinating.  Musculoskeletal:  Positive for gait problem and neck pain. Negative for arthralgias.  Skin: Negative.  Negative for color change and pallor.  Neurological:  Positive for numbness. Negative for dizziness, weakness and headaches.  Hematological:  Negative for adenopathy. Does not bruise/bleed easily.  Psychiatric/Behavioral: Negative.      Objective:  BP 122/62 (BP Location: Right Arm, Patient Position: Sitting, Cuff Size: Large)   Pulse 71   Temp 98.1 F (36.7 C) (Oral)   Ht '6\' 2"'$  (1.88 m)   Wt 153 lb (69.4 kg)   SpO2 96%   BMI 19.64 kg/m   Physical Exam Vitals reviewed.  Constitutional:      General: He is not in acute distress.    Appearance: He is not toxic-appearing  or diaphoretic.  HENT:     Mouth/Throat:     Mouth: Mucous membranes are moist.  Eyes:     General: No scleral icterus.    Conjunctiva/sclera: Conjunctivae normal.  Cardiovascular:     Rate and Rhythm: Normal rate and regular rhythm.     Heart sounds: No murmur heard. Pulmonary:     Effort: Pulmonary effort is normal.     Breath sounds: No stridor. No wheezing, rhonchi or rales.  Abdominal:     General: Abdomen is scaphoid. Bowel sounds are normal.  There is abdominal bruit.     Palpations: There is no hepatomegaly, splenomegaly or mass.     Tenderness: There is generalized abdominal tenderness. There is guarding. There is no rebound.  Genitourinary:    Prostate: Normal. Not enlarged, not tender and no nodules present.     Rectum: Normal. Guaiac result negative. No mass, tenderness, anal fissure, external hemorrhoid or internal hemorrhoid. Normal anal tone.  Musculoskeletal:        General: Normal range of motion.     Cervical back: Neck supple.  Lymphadenopathy:     Cervical: No cervical adenopathy.  Skin:    General: Skin is warm and dry.  Neurological:     General: No focal deficit present.     Mental Status: He is alert and oriented to person, place, and time.     Lab Results  Component Value Date   WBC 3.3 (L) 12/07/2021   HGB 12.7 (L) 12/07/2021   HCT 37.4 (L) 12/07/2021   PLT 275.0 12/07/2021   GLUCOSE 118 (H) 03/12/2021   CHOL 142 08/10/2021   TRIG 119.0 08/10/2021   HDL 38.40 (L) 08/10/2021   LDLCALC 80 08/10/2021   ALT 25 08/10/2021   AST 18 08/10/2021   NA 139 03/12/2021   K 3.8 03/12/2021   CL 106 03/12/2021   CREATININE 0.99 03/12/2021   BUN 13 03/12/2021   CO2 24 03/12/2021   TSH 0.87 11/03/2019   PSA 0.68 03/07/2021   INR 0.97 07/01/2009     DG ABD ACUTE 2+V W 1V CHEST  Result Date: 12/07/2021 CLINICAL DATA:  abd tenderness EXAM: DG ABDOMEN ACUTE WITH 1 VIEW CHEST COMPARISON:  03/07/2021 chest x-ray. FINDINGS: There is no evidence of dilated bowel loops or free intraperitoneal air. No radiopaque calculi or other significant radiographic abnormality is seen. Heart size and mediastinal contours are within normal limits. Both lungs are clear. IMPRESSION: Negative abdominal radiographs.  No acute cardiopulmonary disease. Electronically Signed   By: Rolm Baptise M.D.   On: 12/07/2021 10:35   DG Wrist Complete Right  Result Date: 11/20/2021 CLINICAL DATA:  Right wrist pain and swelling.  No known injury  EXAM: RIGHT WRIST - COMPLETE 3+ VIEW COMPARISON:  None Available. FINDINGS: There is no evidence of fracture or dislocation. There is no evidence of arthropathy or other focal bone abnormality. Soft tissues are unremarkable. IMPRESSION: Negative. Electronically Signed   By: Davina Poke D.O.   On: 11/20/2021 17:22     DG ABD ACUTE 2+V W 1V CHEST  Result Date: 12/07/2021 CLINICAL DATA:  abd tenderness EXAM: DG ABDOMEN ACUTE WITH 1 VIEW CHEST COMPARISON:  03/07/2021 chest x-ray. FINDINGS: There is no evidence of dilated bowel loops or free intraperitoneal air. No radiopaque calculi or other significant radiographic abnormality is seen. Heart size and mediastinal contours are within normal limits. Both lungs are clear. IMPRESSION: Negative abdominal radiographs.  No acute cardiopulmonary disease. Electronically Signed   By: Lennette Bihari  Dover M.D.   On: 12/07/2021 10:35     Assessment & Plan:   Vencil was seen today for abdominal pain.  Diagnoses and all orders for this visit:  Neuromyelopathy due to vitamin B12 deficiency (HCC)-will restart parenteral B12 replacement therapy. -     CBC with Differential/Platelet; Future -     Folate; Future -     Folate -     CBC with Differential/Platelet -     cyanocobalamin (VITAMIN B12) injection 1,000 mcg  Generalized abdominal tenderness without rebound tenderness-exam and plain films are reassuring. -     DG ABD ACUTE 2+V W 1V CHEST; Future  Other constipation-will check labs to screen for secondary causes of constipation.  We will treat for OIC. -     DG ABD ACUTE 2+V W 1V CHEST; Future -     TSH; Future -     Basic metabolic panel; Future -     Magnesium; Future  Abdominal bruit -     VAS [name] AAA DUPLEX; Future  Therapeutic opioid-induced constipation (OIC) -     Methylnaltrexone Bromide (RELISTOR) 150 MG TABS; Take 3 tablets by mouth daily.   I have discontinued Katharine Look. Bohle "RAY"'s rosuvastatin, varenicline, tamsulosin, ondansetron,  oxyCODONE-acetaminophen, and predniSONE. I am also having him start on Relistor. Additionally, I am having him maintain his HYDROcodone-acetaminophen. We administered cyanocobalamin.  Meds ordered this encounter  Medications   Methylnaltrexone Bromide (RELISTOR) 150 MG TABS    Sig: Take 3 tablets by mouth daily.    Dispense:  270 tablet    Refill:  1   cyanocobalamin (VITAMIN B12) injection 1,000 mcg     Follow-up: Return in about 3 months (around 03/09/2022).  [name], MD

## 2021-12-08 ENCOUNTER — Telehealth: Payer: Self-pay | Admitting: Internal Medicine

## 2021-12-08 NOTE — Telephone Encounter (Signed)
Pt is requesting a call from the nurse to discuss Methylnaltrexone Bromide (RELISTOR) 150 MG TABS. Pt stated his medicaid will not pay for this rx. Pt would like to know what else can he be prescribed that his insurance will cover.  Please advise  CB: [phone]

## 2021-12-08 NOTE — Telephone Encounter (Signed)
Pt would like an alternative medication. Pharmacy stated rx is $2k. Pt also requested a referral to dermatology in [place] (preferably) or [place].

## 2021-12-09 ENCOUNTER — Other Ambulatory Visit: Payer: Self-pay | Admitting: Internal Medicine

## 2021-12-09 DIAGNOSIS — K5903 Drug induced constipation: Secondary | ICD-10-CM

## 2021-12-09 MED ORDER — LUBIPROSTONE 24 MCG PO CAPS: 24.0000 ug | ORAL_CAPSULE | Freq: Two times a day (BID) | ORAL | 1 refills | Status: DC

## 2021-12-13 ENCOUNTER — Encounter: Payer: Self-pay | Admitting: Internal Medicine

## 2021-12-13 ENCOUNTER — Ambulatory Visit (INDEPENDENT_AMBULATORY_CARE_PROVIDER_SITE_OTHER): Payer: Medicaid Other | Admitting: Internal Medicine

## 2021-12-13 VITALS — BP 108/64 | HR 84 | Temp 97.7°F | Ht 74.0 in | Wt 156.0 lb

## 2021-12-13 DIAGNOSIS — T402X5A Adverse effect of other opioids, initial encounter: Secondary | ICD-10-CM

## 2021-12-13 DIAGNOSIS — K5903 Drug induced constipation: Secondary | ICD-10-CM | POA: Diagnosis not present

## 2021-12-13 DIAGNOSIS — R0989 Other specified symptoms and signs involving the circulatory and respiratory systems: Secondary | ICD-10-CM

## 2021-12-13 DIAGNOSIS — I77811 Abdominal aortic ectasia: Secondary | ICD-10-CM | POA: Diagnosis not present

## 2021-12-13 MED ORDER — LUBIPROSTONE 24 MCG PO CAPS: 24.0000 ug | ORAL_CAPSULE | Freq: Two times a day (BID) | ORAL | 1 refills | Status: DC

## 2021-12-13 NOTE — Progress Notes (Signed)
Subjective:  Patient ID: Kenneth Gilbert, male    DOB: 1965/10/03  Age: 56 y.o. MRN: [id number]  CC: Constipation    HPI Kenneth Gilbert presents for f/up -  He is frustrated that his neck surgery was canceled because the neurosurgeon saw that I ordered an ultrasound to screen for abdominal aortic aneurysm.  When I last saw him I told him that I heard a bruit and that we would do an ultrasound.  He continues to complain of constipation.  He is not taking Relistor because it was too expensive.  He has not started Amitiza yet.  Outpatient Medications Prior to Visit  Medication Sig Dispense Refill   HYDROcodone-acetaminophen (NORCO/VICODIN) 5-325 MG tablet Take 1 tablet by mouth every 6 (six) hours as needed for moderate pain. 15 tablet 0   lubiprostone (AMITIZA) 24 MCG capsule Take 1 capsule (24 mcg total) by mouth 2 (two) times daily with a meal. (Patient not taking: Reported on 12/13/2021) 180 capsule 1   No facility-administered medications prior to visit.    ROS Review of Systems  Constitutional: Negative.   HENT: Negative.    Eyes: Negative.   Respiratory:  Negative for cough, chest tightness, shortness of breath and wheezing.   Cardiovascular:  Negative for chest pain, palpitations and leg swelling.  Gastrointestinal:  Negative for abdominal pain.  Endocrine: Negative.   Genitourinary: Negative.  Negative for difficulty urinating.  Musculoskeletal:  Positive for neck pain. Negative for arthralgias and myalgias.  Skin: Negative.  Negative for pallor.  Neurological:  Negative for dizziness, syncope, speech difficulty and headaches.  Hematological:  Negative for adenopathy. Does not bruise/bleed easily.  Psychiatric/Behavioral: Negative.      Objective:  BP 108/64 (BP Location: Left Arm, Patient Position: Sitting, Cuff Size: Small)   Pulse 84   Temp 97.7 F (36.5 C) (Oral)   Ht '6\' 2"'$  (1.88 m)   Wt 156 lb (70.8 kg)   SpO2 98%   BMI 20.03 kg/m   BP Readings from Last  3 Encounters:  12/13/21 108/64  12/07/21 122/62  11/20/21 118/85    Wt Readings from Last 3 Encounters:  12/13/21 156 lb (70.8 kg)  12/07/21 153 lb (69.4 kg)  11/20/21 159 lb (72.1 kg)    Physical Exam Vitals reviewed.  HENT:     Nose: Nose normal.     Mouth/Throat:     Mouth: Mucous membranes are moist.  Eyes:     General: No scleral icterus.    Conjunctiva/sclera: Conjunctivae normal.  Cardiovascular:     Rate and Rhythm: Normal rate and regular rhythm.     Heart sounds: No murmur heard. Pulmonary:     Effort: Pulmonary effort is normal.     Breath sounds: No stridor. No wheezing, rhonchi or rales.  Abdominal:     General: Abdomen is flat. Bowel sounds are normal. There is abdominal bruit. There is no distension.     Palpations: There is no hepatomegaly, splenomegaly or mass.  Musculoskeletal:        General: Normal range of motion.     Cervical back: Neck supple.     Right lower leg: No edema.     Left lower leg: No edema.  Lymphadenopathy:     Cervical: No cervical adenopathy.  Skin:    General: Skin is warm and dry.  Neurological:     General: No focal deficit present.     Mental Status: He is alert.  Psychiatric:  Attention and Perception: He is inattentive.        Mood and Affect: Mood is depressed. Mood is not anxious. Affect is angry.        Speech: Speech normal.        Behavior: Behavior normal.        Thought Content: Thought content normal.     Lab Results  Component Value Date   WBC 3.3 (L) 12/07/2021   HGB 12.7 (L) 12/07/2021   HCT 37.4 (L) 12/07/2021   PLT 275.0 12/07/2021   GLUCOSE 118 (H) 03/12/2021   CHOL 142 08/10/2021   TRIG 119.0 08/10/2021   HDL 38.40 (L) 08/10/2021   LDLCALC 80 08/10/2021   ALT 25 08/10/2021   AST 18 08/10/2021   NA 139 03/12/2021   K 3.8 03/12/2021   CL 106 03/12/2021   CREATININE 0.99 03/12/2021   BUN 13 03/12/2021   CO2 24 03/12/2021   TSH 0.87 11/03/2019   PSA 0.68 03/07/2021   INR 0.97  07/01/2009    DG Wrist Complete Right  Result Date: 11/20/2021 CLINICAL DATA:  Right wrist pain and swelling.  No known injury EXAM: RIGHT WRIST - COMPLETE 3+ VIEW COMPARISON:  None Available. FINDINGS: There is no evidence of fracture or dislocation. There is no evidence of arthropathy or other focal bone abnormality. Soft tissues are unremarkable. IMPRESSION: Negative. Electronically Signed   By: Davina Poke D.O.   On: 11/20/2021 17:22    Assessment & Plan:   Finley was seen today for constipation.  Diagnoses and all orders for this visit:  Aortic ectasia, abdominal ([place])- He has an appointment tomorrow for U/S to screen for abdominal aortic aneurysm.  Therapeutic opioid-induced constipation (OIC) -     lubiprostone (AMITIZA) 24 MCG capsule; Take 1 capsule (24 mcg total) by mouth 2 (two) times daily with a meal.  Abdominal bruit- See above.   I am having [name] maintain his HYDROcodone-acetaminophen and lubiprostone.  Meds ordered this encounter  Medications   lubiprostone (AMITIZA) 24 MCG capsule    Sig: Take 1 capsule (24 mcg total) by mouth 2 (two) times daily with a meal.    Dispense:  180 capsule    Refill:  1     Follow-up: Return in about 6 months (around 06/15/2022).  [name], MD

## 2021-12-13 NOTE — Patient Instructions (Signed)

## 2021-12-14 ENCOUNTER — Other Ambulatory Visit: Payer: Self-pay | Admitting: Internal Medicine

## 2021-12-14 ENCOUNTER — Ambulatory Visit (HOSPITAL_COMMUNITY)
Admission: RE | Admit: 2021-12-14 | Discharge: 2021-12-14 | Disposition: A | Discharge status: Home / Self Care | Payer: Medicaid Other | Source: Ambulatory Visit | Attending: Internal Medicine | Admitting: Internal Medicine

## 2021-12-14 ENCOUNTER — Telehealth: Payer: Self-pay | Admitting: Internal Medicine

## 2021-12-14 ENCOUNTER — Telehealth: Payer: Self-pay

## 2021-12-14 DIAGNOSIS — I771 Stricture of artery: Secondary | ICD-10-CM | POA: Insufficient documentation

## 2021-12-14 DIAGNOSIS — R0989 Other specified symptoms and signs involving the circulatory and respiratory systems: Secondary | ICD-10-CM | POA: Insufficient documentation

## 2021-12-14 NOTE — Telephone Encounter (Signed)
Pt is requesting someone call him to explain his ultrasound results.   Ray: [phone]

## 2021-12-14 NOTE — Telephone Encounter (Signed)
Pt is requesting a callback regarding the findings of imaging. Pt received a call wanting him to set up an appt but is scheduled to have spine surgery Tuesday 12/19/21 and need to know what's going on to speak to the surgeon and let them know if he will be able to go through with the surgery at this time.    Pt CB [phone]

## 2021-12-14 NOTE — Telephone Encounter (Signed)
See phone note dated 8/3 for details.

## 2021-12-14 NOTE — Telephone Encounter (Signed)
Janith Lima, MD  12/14/2021  9:32 AM EDT     Please let him know that he does not have an aneurysm but there is stenosis in an artery in his left groin I have ordered a vascular referral   Pt has been informed and expressed understanding.   Pt would like to know if the findings on the [name] will interfere with his upcoming surgery? Please advise.

## 2021-12-15 NOTE — Telephone Encounter (Signed)
Notified pt with MD response../lmb 

## 2021-12-21 ENCOUNTER — Telehealth: Payer: Self-pay | Admitting: *Deleted

## 2021-12-21 NOTE — Telephone Encounter (Signed)
Pt was on cover-my-meds need PA on Amitiza. Submitted w/ (Key: [id number]) waiting on approval status.Marland KitchenJohny Chess

## 2021-12-26 NOTE — Telephone Encounter (Signed)
Rec'd fax determination med was DENIED. It states will mail out letter to patient. No alternative was given.Marland KitchenJohny Gilbert

## 2022-01-03 DIAGNOSIS — Z9889 Other specified postprocedural states: Secondary | ICD-10-CM | POA: Insufficient documentation

## 2022-01-08 ENCOUNTER — Ambulatory Visit (INDEPENDENT_AMBULATORY_CARE_PROVIDER_SITE_OTHER): Payer: Medicaid Other

## 2022-01-08 DIAGNOSIS — E538 Deficiency of other specified B group vitamins: Secondary | ICD-10-CM | POA: Diagnosis not present

## 2022-01-08 MED ORDER — CYANOCOBALAMIN 1000 MCG/ML IJ SOLN
1000.0000 ug | Freq: Once | INTRAMUSCULAR | Status: AC
  Administered 2022-01-08: 1000 ug via INTRAMUSCULAR

## 2022-01-08 NOTE — Progress Notes (Signed)
B12 given Please cosign 

## 2022-02-13 ENCOUNTER — Ambulatory Visit (HOSPITAL_COMMUNITY): Payer: Medicaid Other

## 2022-02-20 ENCOUNTER — Ambulatory Visit: Payer: Medicaid Other | Admitting: Physician Assistant

## 2022-02-20 DIAGNOSIS — N200 Calculus of kidney: Secondary | ICD-10-CM

## 2022-02-27 ENCOUNTER — Ambulatory Visit (INDEPENDENT_AMBULATORY_CARE_PROVIDER_SITE_OTHER): Payer: Medicaid Other | Admitting: Physician Assistant

## 2022-02-27 VITALS — BP 109/67 | HR 77 | Wt 140.0 lb

## 2022-02-27 DIAGNOSIS — N200 Calculus of kidney: Secondary | ICD-10-CM

## 2022-02-27 LAB — URINALYSIS, ROUTINE W REFLEX MICROSCOPIC
Bilirubin, UA: NEGATIVE
Glucose, UA: NEGATIVE
Ketones, UA: NEGATIVE
Leukocytes,UA: NEGATIVE
Nitrite, UA: NEGATIVE
Protein,UA: NEGATIVE
RBC, UA: NEGATIVE
Specific Gravity, UA: 1.02 (ref 1.005–1.030)
Urobilinogen, Ur: 0.2 mg/dL (ref 0.2–1.0)
pH, UA: 5.5 (ref 5.0–7.5)

## 2022-02-27 NOTE — Progress Notes (Signed)
Assessment: 1. Nephrolithiasis - Urinalysis, Routine w reflex microscopic    Plan: Renal US as ordered and FU in 6 months for recheck. Continue stone prevention diet.   Chief Complaint: No chief complaint on file.   HPI: Kenneth Gilbert is a 56 y.o. male who presents for continued evaluation of nephrolithiasis. Pt doing well since last visit. Pt did not get renal US as scheduled. No stone events or gross hematuria since April. Minimal LUTS  UA=clear IPSS=3, QOL=0  08/15/21 Kenneth Gilbert is a 56 y.o. year old male who is seen for further evaluation of a right ureteral calculus.  He was seen by his PCP, Dr. Ronnald Ramp, on 03/07/2021 with a several week history of intermittent gross hematuria.  Urinalysis demonstrated 21-50 RBCs.  He presented to the emergency room on 03/12/2021 with right-sided flank and abdominal pain.  CT imaging showed a 6 mm calculus in the right ureter at the level of L4 with evidence of obstruction and bilateral nephrolithiasis. He continued to have intermittent right sided flank pain.  He was taking pain medication intermittently with relief of his symptoms.  He does have a history of kidney stones but has not required surgical intervention.   He has a history of gross and microscopic hematuria.  He has previously been evaluated by Dr. Jeffie Pollock with CT imaging and cystoscopy.  CT hematuria protocol from 11/21 showed a left extrarenal pelvis, bilateral lower pole calculi, no obstruction or filling defects.  Cystoscopy from 12/21 showed no bladder abnormalities.   He does have a history of tobacco use smoking up to 1-1/2 packs/day for approximately 40 years.   PSA from 10/22: 0.68   KUB from 03/20/2021 showed a 4 mm calcification to the right of L3 corresponding to the previously noted right ureteral stone.     He underwent right ESL on 03/28/21.  He did well following the procedure.   KUB from 04/04/2021 showed resolution of the previously seen right ureteral  calculus and a stable left lower pole calculus. Renal ultrasound from 06/16/2021 shows no evidence of hydronephrosis or renal mass.   He is status post left ESL on 07/11/2021. He returns today for follow-up.  He continues to do very well.  No dysuria or gross hematuria.  No flank pain.  He is not aware of passing any additional stone fragments since his last visit.  Portions of the above documentation were copied from a prior visit for review purposes only.  Allergies: Allergies  Allergen Reactions   Codeine     REACTION: Dizzy   Gabapentin Other (See Comments)    Burning sensation in stomach   Topamax [Topiramate] Other (See Comments)    Kidney stone/hematuria    PMH: Past Medical History:  Diagnosis Date   Arthritis    Dizziness    ETOH abuse    Stopped drinking 4 yrs   Hard of hearing    Headache    Hyperlipidemia     PSH: Past Surgical History:  Procedure Laterality Date   arm surgery Right    arm surgery Left    COLONOSCOPY     EXTRACORPOREAL SHOCK WAVE LITHOTRIPSY Right 03/28/2021   Procedure: EXTRACORPOREAL SHOCK WAVE LITHOTRIPSY (ESWL);  Surgeon: Cleon Gustin, MD;  Location: [facility];  Service: Urology;  Laterality: Right;   EXTRACORPOREAL SHOCK WAVE LITHOTRIPSY Left 07/11/2021   Procedure: EXTRACORPOREAL SHOCK WAVE LITHOTRIPSY (ESWL);  Surgeon: Primus Bravo., MD;  Location: [facility];  Service: Urology;  Laterality: Left;   INNER  EAR SURGERY     as a child   SHOULDER SURGERY Left     SH: Social History   Tobacco Use   Smoking status: Former    Packs/day: 1.00    Types: Cigarettes    Start date: 06/11/1978   Smokeless tobacco: Never  Vaping Use   Vaping Use: Never used  Substance Use Topics   Alcohol use: No    Comment: 01/09/18 - reports no use in 3 years   Drug use: Yes    Types: Marijuana    Comment: occa    ROS: All other review of systems were reviewed and are negative except what is noted above in HPI  PE: BP 109/67   Pulse 77    Wt 140 lb (63.5 kg)   BMI 17.97 kg/m  GENERAL APPEARANCE:  Well appearing, well developed, well nourished, NAD HEENT:  Atraumatic, normocephalic NECK:  Supple. Trachea midline ABDOMEN:  Soft, non-tender, no masses EXTREMITIES:  Moves all extremities well, without clubbing, cyanosis, or edema NEUROLOGIC:  Alert and oriented x 3, normal gait MENTAL STATUS:  appropriate BACK:  Non-tender to palpation, No CVAT SKIN:  Warm, dry, and intact   Results: Laboratory Data: Lab Results  Component Value Date   WBC 3.3 (L) 12/07/2021   HGB 12.7 (L) 12/07/2021   HCT 37.4 (L) 12/07/2021   MCV 89.4 12/07/2021   PLT 275.0 12/07/2021    Lab Results  Component Value Date   CREATININE 0.99 03/12/2021    Lab Results  Component Value Date   PSA 0.68 03/07/2021   PSA 0.51 11/03/2019   PSA 0.34 07/17/2018    No results found for: "TESTOSTERONE"  No results found for: "HGBA1C"  Urinalysis    Component Value Date/Time   COLORURINE BROWN (A) 03/12/2021 0458   APPEARANCEUR Clear 08/15/2021 0947   LABSPEC >1.030 (H) 03/12/2021 0458   PHURINE 6.0 03/12/2021 0458   GLUCOSEU Negative 08/15/2021 0947   GLUCOSEU NEGATIVE 03/07/2021 1444   HGBUR LARGE (A) 03/12/2021 0458   BILIRUBINUR Negative 08/15/2021 Cook 03/12/2021 0458   PROTEINUR Negative 08/15/2021 0947   PROTEINUR 100 (A) 03/12/2021 0458   UROBILINOGEN 2.0 (A) 03/07/2021 1444   NITRITE Negative 08/15/2021 0947   NITRITE NEGATIVE 03/12/2021 0458   LEUKOCYTESUR Negative 08/15/2021 0947   LEUKOCYTESUR NEGATIVE 03/12/2021 0458    Lab Results  Component Value Date   LABMICR See below: 08/15/2021   WBCUA None seen 08/15/2021   LABEPIT None seen 08/15/2021   MUCUS Present 06/21/2021   BACTERIA None seen 08/15/2021    Pertinent Imaging: Results for orders placed in visit on 08/15/21  DG Abd 1 View  Narrative CLINICAL DATA:  Kidney stones. Patient had left-sided lithotripsy July 11, 2021.  EXAM: ABDOMEN - 1 VIEW  COMPARISON:  July 20, 2021  FINDINGS: Both kidneys are largely obscured by bowel contents. Within this limitation, no renal stones are identified. Phleboliths are identified in the pelvis. No obvious ureteral stones. No other acute abnormalities.  IMPRESSION: Negative.   Electronically Signed By: Dorise Bullion III M.D. On: 08/15/2021 19:49  No results found for this or any previous visit.  No results found for this or any previous visit.  No results found for this or any previous visit.  Results for orders placed during the hospital encounter of 06/16/21  US RENAL  Narrative CLINICAL DATA:  Nephrolithiasis.  Right ureteral calculus.  EXAM: RENAL / URINARY TRACT ULTRASOUND COMPLETE  COMPARISON:  Abdominal radiograph 05/10/2021,  abdominal CT 03/12/2021  FINDINGS: Right Kidney:  Renal measurements: 12.8 x 5.0 x 6.9 cm = volume: 232 mL. Normal parenchymal echogenicity. No hydronephrosis. No visualized stone on the current exam. Or focal lesion.  Left Kidney:  Renal measurements: 13.7 x 6.1 x 6 8 cm = volume: 294 mL. Slight extrarenal pelvis configuration. No hydronephrosis. Renal calculi on prior radiographs and CT are not well-defined on the current exam. No focal lesion.  Bladder:  Nondistended and not well evaluated. Cannot assess for ureteral jets.  Other:  None.  IMPRESSION: 1. No hydronephrosis. 2. Known intrarenal calculi are not well delineated on the current exam.   Electronically Signed By: Keith Rake M.D. On: 06/18/2021 11:55  No valid procedures specified. No results found for this or any previous visit.  Results for orders placed during the hospital encounter of 03/12/21  CT Renal Stone Study  Narrative CLINICAL DATA:  Flank pain with kidney stone suspected  EXAM: CT ABDOMEN AND PELVIS WITHOUT CONTRAST  TECHNIQUE: Multidetector CT imaging of the abdomen and pelvis was  performed following the standard protocol without IV contrast.  COMPARISON:  04/15/2020  FINDINGS: Lower chest:  No contributory findings.  Hepatobiliary: No focal liver abnormality.No evidence of biliary obstruction or stone.  Pancreas: Unremarkable.  Spleen: Unremarkable.  Adrenals/Urinary Tract: Negative adrenals. Right hydroureteronephrosis due to a ureteric stone measuring 6 mm at the level of L4. At least 3 punctate right renal calculi. Two larger left-sided renal calculi measuring up to 6 mm at the lower pole. No left hydronephrosis. Unremarkable bladder.  Stomach/Bowel:  No obstruction. No visible bowel inflammation  Vascular/Lymphatic: No acute vascular abnormality. Atheromatous calcification of the aorta and iliacs no mass or adenopathy.  Reproductive:No pathologic findings.  Other: No ascites or pneumoperitoneum.  Musculoskeletal: No acute abnormalities.  IMPRESSION: 1. Obstructing 6 mm stone in the right ureter at the level of L4. 2. Bilateral nephrolithiasis. 3.  Aortic Atherosclerosis (ICD10-I70.0).   Electronically Signed By: Jorje Guild M.D. On: 03/12/2021 06:11  No results found for this or any previous visit (from the past 24 hour(s)).

## 2022-03-06 ENCOUNTER — Ambulatory Visit (HOSPITAL_COMMUNITY)
Admission: RE | Admit: 2022-03-06 | Discharge: 2022-03-06 | Disposition: A | Discharge status: Home / Self Care | Payer: Medicaid Other | Source: Ambulatory Visit | Attending: Urology | Admitting: Urology

## 2022-03-06 DIAGNOSIS — N200 Calculus of kidney: Secondary | ICD-10-CM | POA: Diagnosis present

## 2022-03-07 ENCOUNTER — Other Ambulatory Visit: Payer: Self-pay | Admitting: Urology

## 2022-03-07 ENCOUNTER — Encounter: Payer: Self-pay | Admitting: Urology

## 2022-03-07 ENCOUNTER — Other Ambulatory Visit: Payer: Self-pay | Admitting: Orthopedic Surgery

## 2022-03-07 DIAGNOSIS — M25561 Pain in right knee: Secondary | ICD-10-CM

## 2022-03-07 DIAGNOSIS — M2341 Loose body in knee, right knee: Secondary | ICD-10-CM

## 2022-03-15 DIAGNOSIS — G8929 Other chronic pain: Secondary | ICD-10-CM | POA: Insufficient documentation

## 2022-04-02 ENCOUNTER — Ambulatory Visit
Admission: RE | Admit: 2022-04-02 | Discharge: 2022-04-02 | Disposition: A | Discharge status: Home / Self Care | Payer: Medicaid Other | Source: Ambulatory Visit | Attending: Orthopedic Surgery | Admitting: Orthopedic Surgery

## 2022-04-02 DIAGNOSIS — M2341 Loose body in knee, right knee: Secondary | ICD-10-CM

## 2022-04-02 DIAGNOSIS — M25561 Pain in right knee: Secondary | ICD-10-CM

## 2022-04-26 ENCOUNTER — Telehealth: Payer: Self-pay | Admitting: Internal Medicine

## 2022-04-26 NOTE — Telephone Encounter (Signed)
Patient called and said that he was told he was to be called about a referral for his testicle and he still hasn't heard anything back. Please call back at [phone]

## 2022-05-03 ENCOUNTER — Telehealth: Payer: Self-pay | Admitting: Internal Medicine

## 2022-05-03 NOTE — Telephone Encounter (Signed)
Pt declines to make an OV to discuss testicular issue. Pt stated that the referral should have been entered in August. CMA stated that the CC for 8/2 visit was for abdominal pain and constipation. He expressed that he doesn't feel his insurance should have to pay for an additional visit for something that should have already been done. I stated an OV to discuss would be needed to accompany the referral for supporting documentation. Pt stated "thats bullshit" and that he is going to find another PCP and a lawyer because he is tired of being "on the back burner" when he comes to be seen due to him not being about to afford his copay's. I offered to turn his complaint to management. Pt's response was "They are not going to do shit." Pt continued to shout and use profanity towards CMA stating that what CMA was informing him was inaccurate stating "you can say what you want too, its bullshit". I stated that he would not continue to use foul language towards me. I ended conversation with "Have a nice day" and disconnected the call while he continued being irrate.

## 2022-05-03 NOTE — Telephone Encounter (Signed)
I tried to call patient back but Vm is not setup. It was about message bellow: [name], [place]      12/15/21 11:37 AM Note Notified pt with MD response.Marland KitchenJohny Chess    December 14, 2021  Janith Lima, MD  to Kenneth Gilbert, [facility]     12/14/21  5:52 PM The NS and VS will have to make that decision  [name], MD          12/14/21 11:20 AM Kenneth Gilbert, CMA routed this conversation to Janith Lima, MD   Kenneth Gilbert, [place]      12/14/21 11:20 AM Note  Janith Lima, MD  12/14/2021  9:32 AM EDT      Please let him know that he does not have an aneurysm but there is stenosis in an artery in his left groin I have ordered a vascular referral    Pt has been informed and expressed understanding.    Pt would like to know if the findings on the [name] will interfere with his upcoming surgery? Please advise.         They did try to call patient several times and sent letter with no response. He would just need to call them to schedule

## 2022-05-03 NOTE — Telephone Encounter (Signed)
Patient called demanding a call back stating that he feels he shouldn't have to make another appt to come see Dr. Ronnald Gilbert because a referral was suppose to be placed for his testicles and he was told back in August that it was. Patient can be best reached at [phone].

## 2022-06-12 ENCOUNTER — Telehealth: Payer: Self-pay | Admitting: Internal Medicine

## 2022-06-12 NOTE — Telephone Encounter (Signed)
PCP has been removed

## 2022-06-12 NOTE — Telephone Encounter (Signed)
PT calls today with an FYI and notes that he will no longer be seeing [name] as a patient. PT did not go into details as to their current or future care.  CB if needed: [phone]

## 2022-06-20 ENCOUNTER — Ambulatory Visit (HOSPITAL_COMMUNITY): Admission: RE | Admit: 2022-06-20 | Payer: Medicaid Other | Source: Ambulatory Visit

## 2022-06-20 DIAGNOSIS — M25511 Pain in right shoulder: Secondary | ICD-10-CM | POA: Insufficient documentation

## 2022-06-28 ENCOUNTER — Other Ambulatory Visit (HOSPITAL_COMMUNITY): Payer: Self-pay | Admitting: Occupational Therapy

## 2022-06-28 DIAGNOSIS — R1319 Other dysphagia: Secondary | ICD-10-CM

## 2022-07-05 ENCOUNTER — Ambulatory Visit (HOSPITAL_COMMUNITY): Payer: Medicaid Other | Attending: Orthopaedic Surgery | Admitting: Speech Pathology

## 2022-07-05 ENCOUNTER — Encounter (HOSPITAL_COMMUNITY): Payer: Self-pay | Admitting: Speech Pathology

## 2022-07-05 ENCOUNTER — Encounter (HOSPITAL_COMMUNITY): Payer: Self-pay

## 2022-07-05 ENCOUNTER — Ambulatory Visit (HOSPITAL_COMMUNITY): Admission: RE | Admit: 2022-07-05 | Payer: Medicaid Other | Source: Ambulatory Visit

## 2022-07-05 DIAGNOSIS — R1313 Dysphagia, pharyngeal phase: Secondary | ICD-10-CM | POA: Diagnosis present

## 2022-07-05 NOTE — Therapy (Signed)
OUTPATIENT SPEECH LANGUAGE PATHOLOGY SWALLOW EVALUATION   Patient Name: Kenneth Gilbert MRN: [id number] DOB:11-Oct-1965, 57 y.o., male Today's Date: 07/05/2022  PCP: None on file REFERRING PROVIDER: Sherron Ales, MD  END OF SESSION:  End of Session - 07/05/22 1704     Visit Number 1    Number of Visits 4    Date for SLP Re-Evaluation 07/26/22    Authorization Type [place] [employer]    SLP Start Time 1215    SLP Stop Time  1310    SLP Time Calculation (min) 55 min    Activity Tolerance Patient tolerated treatment well             Past Medical History:  Diagnosis Date   Arthritis    Dizziness    ETOH abuse    Stopped drinking 4 yrs   Hard of hearing    Headache    Hyperlipidemia    Past Surgical History:  Procedure Laterality Date   arm surgery Right    arm surgery Left    COLONOSCOPY     EXTRACORPOREAL SHOCK WAVE LITHOTRIPSY Right 03/28/2021   Procedure: EXTRACORPOREAL SHOCK WAVE LITHOTRIPSY (ESWL);  Surgeon: Cleon Gustin, MD;  Location: [facility];  Service: Urology;  Laterality: Right;   EXTRACORPOREAL SHOCK WAVE LITHOTRIPSY Left 07/11/2021   Procedure: EXTRACORPOREAL SHOCK WAVE LITHOTRIPSY (ESWL);  Surgeon: Primus Bravo., MD;  Location: [facility];  Service: Urology;  Laterality: Left;   INNER EAR SURGERY     as a child   SHOULDER SURGERY Left    Patient Active Problem List   Diagnosis Date Noted   Stenosis of left iliac artery ([place]) 12/14/2021   Abdominal bruit 12/07/2021   Therapeutic opioid-induced constipation (OIC) 12/07/2021   Cervical spinal stenosis 06/22/2021   Ureteral calculus, right 03/21/2021   Benign prostatic hyperplasia with urinary hesitancy 03/07/2021   LVH (left ventricular hypertrophy) [id number]   Diastolic dysfunction without heart failure 11/03/2019   Neuromyelopathy due to vitamin B12 deficiency ([place]) 07/17/2018   Other chronic sinusitis 06/11/2018   Vertebral artery stenosis 12/11/2017   Sensorineural hearing  loss (SNHL), bilateral 12/03/2017   Aortic atherosclerosis ([place]) 07/26/2017   Aortic ectasia, abdominal ([place]) 07/26/2017   Chronic vascular disorder of intestine ([place]) 06/12/2017   Hyperlipidemia with target LDL less than 100 06/12/2017   Tobacco abuse 06/11/2017   Nephrolithiasis 02/20/2013   GERD 07/12/2009    ONSET DATE: 12/19/2021   REFERRING DIAG: Dysphagia  THERAPY DIAG:  Dysphagia, pharyngeal phase  Rationale for Evaluation and Treatment: Rehabilitation  SUBJECTIVE:   SUBJECTIVE STATEMENT: "I have a hard time with solid foods." Pt accompanied by: self  PERTINENT HISTORY: Kenneth Gilbert is a 57 yo male who was referred for dysphagia intervention by Dr. Sherron Ales due to dysphagia s/p ACDF (C3-6) surgery 12/19/2021. Pt had FEES on 12/26/21 with recommendation for "runny puree diet with thin liquids". He had MBSS 02/07/22 with recommendation for "soft diet with thin liquids" and again 06/20/2022 with recommendation for "soft diet and thin liquids".   PAIN:  Are you having pain? No  FALLS: Has patient fallen in last 6 months?  No  LIVING ENVIRONMENT: Lives with: lives with their family Lives in: House/apartment  PLOF:  Level of assistance: Independent with ADLs, Independent with IADLs Employment: On disability  PATIENT GOALS: Improve swallow  OBJECTIVE:    RECOMMENDATIONS FROM OBJECTIVE SWALLOW STUDY (MBSS/FEES):    Dysphagia History:  <<12/26/21 FEES: "Patient presents with largely stable swallow function compared to  last MBS. Unfortunately exam overall was limited by poor patient tolerance to scope and decreased subglottic visualization due to posterior pharyngeal wall fullness. Ongoing penetration of thin liquids and single trial of puree that cleared with cued cough. No overt aspiration noted though did frequently visualize progression of thin liquid residue over interarytenoid space and there is suspicion of aspiration though difficult to visualize on  endoscopy. Residue was mild to moderate and reduced with patient independently regurgitating to oral cavity and re-swallowing. Patient is appropriate to continue a runny puree diet with thin liquids, and with adherence to aspiration precautions below. Patient was educated on risks and adverse outcomes associated with dysphagia/aspiration and rationale for adherence to management recommendations. In the setting of weight loss, recommend adding in liquid supplement and choosing higher calorie supplements/options (e.g. Boost Plus, cream-based soups over broth-base). Patient reports he has been unable to purchase supplements due to financial burden. Case of Nutren 2.0 provided from clinic. Should weight loss continue, patient may benefit from referral to dietician to ensure adequate nutrition is met. Additionally introduced and provided education on feeding tube function--to obtain adequate nutrition with decreased aspiration risk--though emphasized that patient could elect to continue oral intake for pleasure. Anticipate ongoing medical recovery and recommend re-evaluation via MBS to directly visualize biomechanics during swallow. Further plan of care pending that evaluation. Encouraged patient to reach out sooner if he has any questions or if there is a change in symptoms. Patient verbalized understanding of all exam results and recommendations. "  02/07/22 MBS: "Patient presents with moderate pharyngoesophageal dysphagia, representing improvement in function, particularly airway protection, compared to previous MBS. Additionally noted reduced appearance of post-operative prevertebral edema (see images below). Dysphagia is characterized by incomplete airway closure, intermittently incomplete epiglottic inversion, reduced bolus propulsion, and reduced UES distention. Deficits resulted in thin liquid penetration that cleared with cough and mild to severe residue that worsened with viscosity and reduced with multiple  swallows and/or liquid wash. Additionally noted mild esophageal retention and backflow on A/P follow through. Patient is appropriate to continue a soft solid diet with thin liquids, and with adherence to aspiration precautions below. Patient was educated on risks and adverse outcomes associated with dysphagia/aspiration and rationale for adherence to management recommendations. In the setting of weight loss, recommended choosing higher calorie supplements/options (e.g. Boost Plus, cream-based soups over broth-base) and discussed Carnation Breakfast as alternative to supplements as patient discontinued these due to financial burden and distaste. Discussed a course of swallow therapy targeting deficits though patient expressed desire to wait until re-evaluation in the setting of ongoing medical recovery. Plan for re-evaluation via MBS in 3 months. Encouraged patient to reach out sooner if he has any questions or if there is a change in symptoms. Patient verbalized understanding of all exam results and recommendations. "  06/20/2022 MBS: Patient presents with stable pharyngoesophageal dysphagia with ongoing penetration of thin liquids after the swallow that clear with cough and mild to moderate residue that reduces with subsequent swallows and/or liquid wash. No aspiration upon challenging. Of note, patient appears sensate to vallecular residue with consistent regurgitation of puree/solid residue to oral cavity with subsequent swallow. Patient is appropriate to continue a soft solid diet with thin liquids, and adherence to aspiration precautions below. Patient was educated on risks and adverse outcomes associated with dysphagia/aspiration and rationale for adherence to management recommendations. Regarding weight loss and will reach out to referring provider to discuss further. Readdressed rationale for a course of swallow therapy to target deficits. Patient expressed  interest though prefers to pursue closer to home at  [facility]. Will help facilitate and reach out to referring provider for referral. Plan for re-evaluation via MBS in 3 months following swallow therapy. Encouraged patient to reach out sooner if he has any questions or if there is a change in symptoms. Patient verbalized understanding of all exam results and recommendations.   Recommendations: Diet: soft solids, thin liquids Aspiration Precautions: sit upright, slow rate, take small bites/sips, liquid wash for puree/grounds/solids, cough/clear throat regularly during meals (particularly with liquids), utilize multiple swallows per bite/sip Administer Medications: continue to crush, if permissible Other recommendations: good oral care, swallow therapy at [facility] in [place] Re-Evaluate: post swallow therapy/3 months via Pharyngeal Function Study (PFS) If you have any questions regarding this study, pease notify Lucienne Capers, [phone]. Thank you for this referral.>>   COGNITION: Overall cognitive status: Within functional limits for tasks assessed Areas of impairment:  N/A  ORAL MOTOR EXAMINATION: Overall status: WFL Comments: N/A, missing some dentition  CLINICAL SWALLOW ASSESSMENT:   Current diet: Dysphagia 3 (mechanical soft) and thin liquids Dentition: missing dentition Patient directly observed with POs: Yes: regular, dysphagia 3 (soft), dysphagia 1 (puree), and thin liquids  Feeding: able to feed self Liquids provided by: cup Oral phase signs and symptoms:  WFL Pharyngeal phase signs and symptoms: multiple swallows, delayed throat clear, and complaints of residue  PATIENT REPORTED OUTCOME MEASURES (PROM): EAT-10: 37  EATING ASSESSMENT TOOL (EAT-10)   The patient was asked to rate to what extent the following statements are problematic on a scale of 0-4. 0 = No problem; 4 = Severe problem. A total score of 3 or higher is considered abnormal. My swallowing problem has caused me to lose weight. 4    My swallowing problem interferes with my ability to go out for meals. 4  Swallowing liquids takes extra effort. 2 Swallowing solids takes extra effort. 4  Swallowing pills takes extra effort. 4 Swallowing is painful. 3  The pleasure of eating is affected by my swallowing. 4  When I swallow food sticks in my throat. 4  I cough when I eat. 4 Swallowing is stressful. 4  TOTAL SCORE: 37 '[X]'$  Abnormal (raw score >3) '[]'$  Normal      TODAY'S TREATMENT: Evaluation completed this date and review of previous MBSS reports.                                                                                                                                    DATE: 07/05/22   PATIENT EDUCATION: Education details: Plan for dysphagia therapy, provided food log and pharyngeal swallowing exercises Person educated: Patient Education method: Explanation, Demonstration, and Handouts Education comprehension: verbalized understanding and needs further education   ASSESSMENT:  CLINICAL IMPRESSION: Patient is a 57 y.o. male who was seen today for clinical swallow evaluation and treatment due to post op (ACDF C3-6, 12/19/2021) dysphagia. Pt last  had MBSS on 06/19/2022 (see above) with recommendation for soft diet and thin liquids with the following precautions: sit upright, slow rate, take small bites/sips, liquid wash for puree/grounds/solids, cough/clear throat regularly during meals (particularly with liquids), utilize multiple swallows per bite/sip. Dysphagia therapy was recommended to address deficits (reduced airway closure, vallecular residue, decreased epiglottic deflection, and reduced UES distention). Pt reports that he has been trying to eat "whatever" he can and just cuts "challenging" solids into small bites. He reports ongoing pharyngeal globus sensation with need for repeat swallows and liquid wash. Will plan to see Pt for dysphagia therapy. Pt's weight is reportedly "steady" at this time.    OBJECTIVE  IMPAIRMENTS: include dysphagia. These impairments are limiting patient from safety when swallowing. Factors affecting potential to achieve goals and functional outcome are severity of impairments and Time post onset . Patient will benefit from skilled SLP services to address above impairments and improve overall function.  REHAB POTENTIAL: Good   GOALS: Goals reviewed with patient? Yes  SHORT TERM GOALS: Target date: 08/09/2022  Pt will consume least restrictive diet of self regulated regular textures and thin liquids with use of compensatory strategies with indirect cues. Baseline: Pt consuming soft textures, some regular Goal status: INITIAL  2.  Pt will complete pharyngeal swallowing exercises including but not limited to: masako, effortful swallow, Mendelsohn, lingual press, chin tuck against resistance, and laryngeal closure with initial model provided by SLP and daily completion of 3x per day per Pt self-report. Baseline:  Goal status: INITIAL  3.  Complete MBSS in ~8+ weeks if desired by Pt and deemed necessary by SLP/MD Baseline: Last completed on 06/20/22 Goal status: INITIAL    PLAN:  SLP FREQUENCY: 1x/week  SLP DURATION: 4 weeks  PLANNED INTERVENTIONS: Pharyngeal strengthening exercises, Diet toleration management , Trials of upgraded texture/liquids, Cueing hierachy, SLP instruction and feedback, Compensatory strategies, Patient/family education, and Re-evaluation    Thank you,  Genene Churn, [place]  [place], [place] 07/05/2022, 5:06 PM

## 2022-07-12 ENCOUNTER — Encounter (HOSPITAL_COMMUNITY): Payer: Self-pay | Admitting: Speech Pathology

## 2022-07-12 ENCOUNTER — Ambulatory Visit (HOSPITAL_COMMUNITY): Payer: Medicaid Other | Admitting: Speech Pathology

## 2022-07-12 DIAGNOSIS — R1313 Dysphagia, pharyngeal phase: Secondary | ICD-10-CM

## 2022-07-12 NOTE — Therapy (Signed)
OUTPATIENT SPEECH LANGUAGE PATHOLOGY TREATMENT NOTE   Patient Name: Kenneth Gilbert MRN: [id number] DOB:[birth year], 57 y.o., male Today's Date: 07/12/2022  PCP: None on file REFERRING PROVIDER: Sherron Ales,  END OF SESSION:   End of Session - 07/12/22 1237     Visit Number 2    Number of Visits 4    Date for SLP Re-Evaluation 07/26/22    Authorization Type [place] Medicaid [employer]    SLP Start Time 0908    SLP Stop Time  0950    SLP Time Calculation (min) 42 min    Activity Tolerance Patient tolerated treatment well             Past Medical History:  Diagnosis Date   Arthritis    Dizziness    ETOH abuse    Stopped drinking 4 yrs   Hard of hearing    Headache    Hyperlipidemia    Past Surgical History:  Procedure Laterality Date   arm surgery Right    arm surgery Left    COLONOSCOPY     EXTRACORPOREAL SHOCK WAVE LITHOTRIPSY Right 03/28/2021   Procedure: EXTRACORPOREAL SHOCK WAVE LITHOTRIPSY (ESWL);  Surgeon: Cleon Gustin, MD;  Location: [facility];  Service: Urology;  Laterality: Right;   EXTRACORPOREAL SHOCK WAVE LITHOTRIPSY Left 07/11/2021   Procedure: EXTRACORPOREAL SHOCK WAVE LITHOTRIPSY (ESWL);  Surgeon: Primus Bravo., MD;  Location: [facility];  Service: Urology;  Laterality: Left;   INNER EAR SURGERY     as a child   SHOULDER SURGERY Left    Patient Active Problem List   Diagnosis Date Noted   Stenosis of left iliac artery ([place]) 12/14/2021   Abdominal bruit 12/07/2021   Therapeutic opioid-induced constipation (OIC) 12/07/2021   Cervical spinal stenosis 06/22/2021   Ureteral calculus, right 03/21/2021   Benign prostatic hyperplasia with urinary hesitancy 03/07/2021   LVH (left ventricular hypertrophy) [id number]   Diastolic dysfunction without heart failure 11/03/2019   Neuromyelopathy due to vitamin B12 deficiency ([place]) 07/17/2018   Other chronic sinusitis 06/11/2018   Vertebral artery stenosis 12/11/2017   Sensorineural  hearing loss (SNHL), bilateral 12/03/2017   Aortic atherosclerosis ([place]) 07/26/2017   Aortic ectasia, abdominal ([place]) 07/26/2017   Chronic vascular disorder of intestine ([place]) 06/12/2017   Hyperlipidemia with target LDL less than 100 06/12/2017   Tobacco abuse 06/11/2017   Nephrolithiasis 02/20/2013   GERD 07/12/2009    ONSET DATE: 12/19/2021  REFERRING DIAG: Dysphagia  THERAPY DIAG:  Dysphagia, pharyngeal phase  Rationale for Evaluation and Treatment: Rehabilitation  SUBJECTIVE:    SUBJECTIVE STATEMENT: "I tried to wash the food down with more food, like you said, but it works better with water." Pt accompanied by: self   PERTINENT HISTORY: Kenneth Gilbert is a 57 yo male who was referred for dysphagia intervention by Dr. Sherron Ales due to dysphagia s/p ACDF (C3-6) surgery 12/19/2021. Pt had FEES on 12/26/21 with recommendation for "runny puree diet with thin liquids". He had MBSS 02/07/22 with recommendation for "soft diet with thin liquids" and again 06/20/2022 with recommendation for "soft diet and thin liquids".   PAIN:  Are you having pain? No   OBJECTIVE:  ASSESSMENT:   CLINICAL IMPRESSION: (From evaluation 07/05/22) Patient is a 57 y.o. male who was seen today for clinical swallow evaluation and treatment due to post op (ACDF C3-6, 12/19/2021) dysphagia. Pt last had MBSS on 06/19/2022 (see above) with recommendation for soft diet and thin liquids with the following precautions: sit upright,  slow rate, take small bites/sips, liquid wash for puree/grounds/solids, cough/clear throat regularly during meals (particularly with liquids), utilize multiple swallows per bite/sip. Dysphagia therapy was recommended to address deficits (reduced airway closure, vallecular residue, decreased epiglottic deflection, and reduced UES distention). Pt reports that he has been trying to eat "whatever" he can and just cuts "challenging" solids into small bites. He reports ongoing pharyngeal globus  sensation with need for repeat swallows and liquid wash. Will plan to see Pt for dysphagia therapy. Pt's weight is reportedly "steady" at this time.      OBJECTIVE IMPAIRMENTS: include dysphagia. These impairments are limiting patient from safety when swallowing. Factors affecting potential to achieve goals and functional outcome are severity of impairments and Time post onset . Patient will benefit from skilled SLP services to address above impairments and improve overall function.   REHAB POTENTIAL: Good     GOALS: Goals reviewed with patient? Yes   SHORT TERM GOALS: Target date: 08/09/2022   Pt will consume least restrictive diet of self regulated regular textures and thin liquids with use of compensatory strategies with indirect cues. Baseline: Pt consuming soft textures, some regular Goal status: INITIAL   2.  Pt will complete pharyngeal swallowing exercises including but not limited to: masako, effortful swallow, Mendelsohn, lingual press, chin tuck against resistance, and laryngeal closure with initial model provided by SLP and daily completion of 3x per day per Pt self-report. Baseline:  Goal status: INITIAL   3.  Complete MBSS in ~8+ weeks if desired by Pt and deemed necessary by SLP/MD Baseline: Last completed on 06/20/22 Goal status: INITIAL       TODAY'S TREATMENT:  Pt completed pharyngeal swallowing exercises at home per his report and brought in a completed food journal for the past week. Pt consumed a variety of foods, including chicken salad, macaroni and cheese, tuna salad, scrambled eggs, cornbread, chopped steak. He purchased a new scale and current weight is 154 pounds. In session, he was able to return demonstrate chin tuck against resistance, Mendelsohn, Masako, and tongue press. He consumed a blueberry cereal bar with use of thin water wash (on every bite of solid). SLP reviewed that his swallow recovery will likely take time and that he should continue to eat/drink by  mouth as much as possible and continue with swallowing exercises. Continue plan of care next session.                                                                                                                                        DATE: 07/12/22   PLAN:   SLP FREQUENCY: 1x/week   SLP DURATION: 3 weeks   PLANNED INTERVENTIONS: Pharyngeal strengthening exercises, Diet toleration management , Trials of upgraded texture/liquids, Cueing hierachy, SLP instruction and feedback, Compensatory strategies, Patient/family education, and Re-evaluation     Thank you,  Genene Churn, [place]  [place], [place] 07/12/2022, 12:38 PM

## 2022-07-19 ENCOUNTER — Ambulatory Visit (HOSPITAL_COMMUNITY): Payer: Medicaid Other | Attending: Orthopaedic Surgery | Admitting: Speech Pathology

## 2022-07-19 ENCOUNTER — Encounter (HOSPITAL_COMMUNITY): Payer: Self-pay | Admitting: Speech Pathology

## 2022-07-19 DIAGNOSIS — R1313 Dysphagia, pharyngeal phase: Secondary | ICD-10-CM | POA: Diagnosis present

## 2022-07-19 NOTE — Therapy (Signed)
OUTPATIENT SPEECH LANGUAGE PATHOLOGY TREATMENT NOTE   Patient Name: Kenneth Gilbert MRN: [id number] DOB:1965/05/18, 57 y.o., male Today's Date: 07/19/2022  PCP: None on file REFERRING PROVIDER: Sherron Ales,  END OF SESSION:   End of Session - 07/19/22 1239     Visit Number 3    Number of Visits 4    Date for SLP Re-Evaluation 09/13/22    Authorization Type  Medicaid [employer]    SLP Start Time 0900    SLP Stop Time  0945    SLP Time Calculation (min) 45 min    Activity Tolerance Patient tolerated treatment well             Past Medical History:  Diagnosis Date   Arthritis    Dizziness    ETOH abuse    Stopped drinking 4 yrs   Hard of hearing    Headache    Hyperlipidemia    Past Surgical History:  Procedure Laterality Date   arm surgery Right    arm surgery Left    COLONOSCOPY     EXTRACORPOREAL SHOCK WAVE LITHOTRIPSY Right 03/28/2021   Procedure: EXTRACORPOREAL SHOCK WAVE LITHOTRIPSY (ESWL);  Surgeon: Cleon Gustin, MD;  Location: [facility];  Service: Urology;  Laterality: Right;   EXTRACORPOREAL SHOCK WAVE LITHOTRIPSY Left 07/11/2021   Procedure: EXTRACORPOREAL SHOCK WAVE LITHOTRIPSY (ESWL);  Surgeon: Primus Bravo., MD;  Location: [facility];  Service: Urology;  Laterality: Left;   INNER EAR SURGERY     as a child   SHOULDER SURGERY Left    Patient Active Problem List   Diagnosis Date Noted   Stenosis of left iliac artery ([place]) 12/14/2021   Abdominal bruit 12/07/2021   Therapeutic opioid-induced constipation (OIC) 12/07/2021   Cervical spinal stenosis 06/22/2021   Ureteral calculus, right 03/21/2021   Benign prostatic hyperplasia with urinary hesitancy 03/07/2021   LVH (left ventricular hypertrophy) [id number]   Diastolic dysfunction without heart failure 11/03/2019   Neuromyelopathy due to vitamin B12 deficiency ([place]) 07/17/2018   Other chronic sinusitis 06/11/2018   Vertebral artery stenosis 12/11/2017   Sensorineural hearing  loss (SNHL), bilateral 12/03/2017   Aortic atherosclerosis ([place]) 07/26/2017   Aortic ectasia, abdominal ([place]) 07/26/2017   Chronic vascular disorder of intestine ([place]) 06/12/2017   Hyperlipidemia with target LDL less than 100 06/12/2017   Tobacco abuse 06/11/2017   Nephrolithiasis 02/20/2013   GERD 07/12/2009    ONSET DATE: 12/19/2021  REFERRING DIAG: Dysphagia  THERAPY DIAG:  Dysphagia, pharyngeal phase  Rationale for Evaluation and Treatment: Rehabilitation  SUBJECTIVE:    SUBJECTIVE STATEMENT: "I am still trying to eat what I want, but it just takes a lot of effort." Pt accompanied by: self   PERTINENT HISTORY: Kenneth Gilbert "Kenneth" Gilbert is a 57 yo male who was referred for dysphagia intervention by Dr. Sherron Ales due to dysphagia s/p ACDF (C3-6) surgery 12/19/2021. Pt had FEES on 12/26/21 with recommendation for "runny puree diet with thin liquids". He had MBSS 02/07/22 with recommendation for "soft diet with thin liquids" and again 06/20/2022 with recommendation for "soft diet and thin liquids".   PAIN:  Are you having pain? No   OBJECTIVE:  ASSESSMENT:   CLINICAL IMPRESSION: (From evaluation 07/05/22) Patient is a 57 y.o. male who was seen today for clinical swallow evaluation and treatment due to post op (ACDF C3-6, 12/19/2021) dysphagia. Pt last had MBSS on 06/19/2022 (see above) with recommendation for soft diet and thin liquids with the following precautions: sit upright, slow rate,  take small bites/sips, liquid wash for puree/grounds/solids, cough/clear throat regularly during meals (particularly with liquids), utilize multiple swallows per bite/sip. Dysphagia therapy was recommended to address deficits (reduced airway closure, vallecular residue, decreased epiglottic deflection, and reduced UES distention). Pt reports that he has been trying to eat "whatever" he can and just cuts "challenging" solids into small bites. He reports ongoing pharyngeal globus sensation with need for  repeat swallows and liquid wash. Will plan to see Pt for dysphagia therapy. Pt's weight is reportedly "steady" at this time.      OBJECTIVE IMPAIRMENTS: include dysphagia. These impairments are limiting patient from safety when swallowing. Factors affecting potential to achieve goals and functional outcome are severity of impairments and Time post onset . Patient will benefit from skilled SLP services to address above impairments and improve overall function.   REHAB POTENTIAL: Good     GOALS: Goals reviewed with patient? Yes   SHORT TERM GOALS: Target date: 08/09/2022   Pt will consume least restrictive diet of self regulated regular textures and thin liquids with use of compensatory strategies with indirect cues. Baseline: Pt consuming soft textures, some regular Goal status: INITIAL   2.  Pt will complete pharyngeal swallowing exercises including but not limited to: masako, effortful swallow, Mendelsohn, lingual press, chin tuck against resistance, and laryngeal closure with initial model provided by SLP and daily completion of 3x per day per Pt self-report. Baseline:  Goal status: INITIAL   3.  Complete MBSS in ~8+ weeks if desired by Pt and deemed necessary by SLP/MD Baseline: Last completed on 06/20/22 Goal status: INITIAL       TODAY'S TREATMENT:  Pt continues to complete the pharyngeal exercises assigned and was able to return demonstrate in session. His current weight is 154 pounds with clothes and 147 without clothes and shoes. In session, he was able to return demonstrate chin tuck against resistance, Mendelsohn, Masako, and tongue press. He consumed 180 cc water via cup sips with use of strategies independently. He is scheduled for MRI of his shoulders this Sunday and will follow up with the surgeon about possible surgery. He voiced concerns about creating additional swallowing problems with another surgery. SLP encouraged him to talk to his physician about the intubation, but  the surgery is very different from his ACDF location etc.  SLP reviewed that his swallow recovery will likely take time and that he should continue to eat/drink by mouth as much as possible and continue with swallowing exercises. We will defer his last SLP session to the end of April to monitor his swallowing at that time. He does not need to complete another MBSS unless his swallow function declines or if he just wants to see the imaging (he is eating what he wants and suspect it will take more time). Continue plan of care next session in April.  DATE: 07/19/22   PLAN:   SLP FREQUENCY: 1x/week   SLP DURATION: 3 weeks   PLANNED INTERVENTIONS: Pharyngeal strengthening exercises, Diet toleration management , Trials of upgraded texture/liquids, Cueing hierachy, SLP instruction and feedback, Compensatory strategies, Patient/family education, and Re-evaluation     Thank you,  Genene Churn, [place]  [place], [place] 07/19/2022, 12:45 PM

## 2022-07-21 ENCOUNTER — Other Ambulatory Visit: Payer: Self-pay | Admitting: Internal Medicine

## 2022-07-26 ENCOUNTER — Encounter (HOSPITAL_COMMUNITY): Payer: Medicaid Other | Admitting: Speech Pathology

## 2022-08-15 DIAGNOSIS — R1312 Dysphagia, oropharyngeal phase: Secondary | ICD-10-CM | POA: Insufficient documentation

## 2022-08-17 ENCOUNTER — Telehealth: Payer: Self-pay | Admitting: *Deleted

## 2022-08-17 NOTE — Telephone Encounter (Signed)
Pt called wanted Dr. Yetta Barre to cancel a CT scan schedule back in January so his new Dr. Evaristo Bury schedule his appt. Pt asked can someone let him know when its cancel.

## 2022-08-17 NOTE — Telephone Encounter (Signed)
Tried calling pt regarding CT scan appt. No VM is set up to leave a message.

## 2022-09-05 ENCOUNTER — Ambulatory Visit (HOSPITAL_COMMUNITY): Payer: Medicaid Other | Attending: Orthopaedic Surgery | Admitting: Speech Pathology

## 2022-09-05 DIAGNOSIS — R1313 Dysphagia, pharyngeal phase: Secondary | ICD-10-CM | POA: Diagnosis present

## 2022-09-05 NOTE — Therapy (Signed)
OUTPATIENT SPEECH LANGUAGE PATHOLOGY TREATMENT NOTE   Patient Name: Kenneth Gilbert MRN: [id number] DOB:[birth year], 57 y.o., male Today's Date: 09/05/2022  PCP: None on file REFERRING PROVIDER: Leonard Schwartz,  END OF SESSION:   End of Session - 09/05/22 1058     Visit Number 4    Number of Visits 11    Date for SLP Re-Evaluation 10/11/22    Authorization Type [place] [employer]    SLP Start Time 0915    SLP Stop Time  1005    SLP Time Calculation (min) 50 min    Activity Tolerance Patient tolerated treatment well             Past Medical History:  Diagnosis Date   Arthritis    Dizziness    ETOH abuse    Stopped drinking 4 yrs   Hard of hearing    Headache    Hyperlipidemia    Past Surgical History:  Procedure Laterality Date   arm surgery Right    arm surgery Left    COLONOSCOPY     EXTRACORPOREAL SHOCK WAVE LITHOTRIPSY Right 03/28/2021   Procedure: EXTRACORPOREAL SHOCK WAVE LITHOTRIPSY (ESWL);  Surgeon: Malen Gauze, MD;  Location: [facility];  Service: Urology;  Laterality: Right;   EXTRACORPOREAL SHOCK WAVE LITHOTRIPSY Left 07/11/2021   Procedure: EXTRACORPOREAL SHOCK WAVE LITHOTRIPSY (ESWL);  Surgeon: Milderd Meager., MD;  Location: [facility];  Service: Urology;  Laterality: Left;   INNER EAR SURGERY     as a child   SHOULDER SURGERY Left    Patient Active Problem List   Diagnosis Date Noted   Stenosis of left iliac artery 12/14/2021   Abdominal bruit 12/07/2021   Therapeutic opioid-induced constipation (OIC) 12/07/2021   Cervical spinal stenosis 06/22/2021   Ureteral calculus, right 03/21/2021   Benign prostatic hyperplasia with urinary hesitancy 03/07/2021   LVH (left ventricular hypertrophy) 11/03/2019   Diastolic dysfunction without heart failure 11/03/2019   Neuromyelopathy due to vitamin B12 deficiency 07/17/2018   Other chronic sinusitis 06/11/2018   Vertebral artery stenosis 12/11/2017   Sensorineural hearing loss  (SNHL), bilateral 12/03/2017   Aortic atherosclerosis 07/26/2017   Aortic ectasia, abdominal 07/26/2017   Chronic vascular disorder of intestine 06/12/2017   Hyperlipidemia with target LDL less than 100 06/12/2017   Tobacco abuse 06/11/2017   Nephrolithiasis 02/20/2013   GERD 07/12/2009    ONSET DATE: 12/19/2021  REFERRING DIAG: Dysphagia  THERAPY DIAG:  Dysphagia, pharyngeal phase  Rationale for Evaluation and Treatment: Rehabilitation  SUBJECTIVE:    SUBJECTIVE STATEMENT: "I am still trying to eat what I want, but it just takes a lot of effort." Pt accompanied by: self   PERTINENT HISTORY: Kenneth Gilbert is a 57 yo male who was referred for dysphagia intervention by Dr. Leonard Schwartz due to dysphagia s/p ACDF (C3-6) surgery 12/19/2021. Pt had FEES on 12/26/21 with recommendation for "runny puree diet with thin liquids". He had MBSS 02/07/22 with recommendation for "soft diet with thin liquids" and again 06/20/2022 with recommendation for "soft diet and thin liquids".   PAIN:  Are you having pain? No   OBJECTIVE:  ASSESSMENT:   CLINICAL IMPRESSION: (From evaluation 07/05/22) Patient is a 57 y.o. male who was seen today for clinical swallow evaluation and treatment due to post op (ACDF C3-6, 12/19/2021) dysphagia. Pt last had MBSS on 06/19/2022 (see above) with recommendation for soft diet and thin liquids with the following precautions: sit upright, slow rate, take small bites/sips, liquid wash  for puree/grounds/solids, cough/clear throat regularly during meals (particularly with liquids), utilize multiple swallows per bite/sip. Dysphagia therapy was recommended to address deficits (reduced airway closure, vallecular residue, decreased epiglottic deflection, and reduced UES distention). Pt reports that he has been trying to eat "whatever" he can and just cuts "challenging" solids into small bites. He reports ongoing pharyngeal globus sensation with need for repeat swallows and liquid  wash. Will plan to see Pt for dysphagia therapy. Pt's weight is reportedly "steady" at this time.      OBJECTIVE IMPAIRMENTS: include dysphagia. These impairments are limiting patient from safety when swallowing. Factors affecting potential to achieve goals and functional outcome are severity of impairments and Time post onset . Patient will benefit from skilled SLP services to address above impairments and improve overall function.   REHAB POTENTIAL: Good     GOALS: Goals reviewed with patient? Yes   SHORT TERM GOALS: Target date: 10/11/2022   Pt will consume least restrictive diet of self regulated regular textures and thin liquids with use of compensatory strategies with indirect cues. Baseline: Pt consuming soft textures, some regular Goal status: ONGOING   2.  Pt will complete pharyngeal swallowing exercises including but not limited to: masako, effortful swallow, Mendelsohn, lingual press, chin tuck against resistance, and laryngeal closure with initial model provided by SLP and daily completion of 3x per day per Pt self-report. Baseline: completing 2-3x/day Goal status: ONGOING   3.  Complete MBSS in ~8+ weeks if desired by Pt and deemed necessary by SLP/MD Baseline: Last completed on 06/20/22 Goal status: Plan for mid to end of May 2024   ENT consult 08/07/2022: <<Findings: The nasal cavity and nasopharynx are unremarkable. The tongue base, pharyngeal walls, piriform sinuses, vallecula, epiglottis and postcricoid region are normal in appearance. The visualized portion of the subglottis and proximal trachea is widely patent. The vocal folds are mobile bilaterally with good glottal closure. There are no lesions on the free edge of the vocal folds nor elsewhere in the larynx worrisome for malignancy.   Impression: Excellent bilateral vocal fold motion. No gross anatomic abnormality to explain ongoing dysphagia. Electronically signed by: Noralee Space, MD  08/07/2022>>  Appointments with Adora Fridge, SLP on 08/15/22 and 08/30/22 <<Today patient reports that he is unable to swallow. He has to continually cough things up when he is eating. Endorses choking, but denies wrong pipe sensation. Is crushing medications. He did attend ~3 sessions of swallowing therapy locally. Is currently working on Visual merchandiser for ~6 min total once daily.  Completed bioFEESback with sausage biscuit that patient brought from home. Practiced strategies of multiple swallows, liquid wash, and suppressing "hock" behavior. Patient required maximal cues for this. Limited scope tolerance so biofeedback was brief--did observe moderate-severe residue with this texture, though airway closure was intact. Following scope removal, reviewed images extensively with patient, and worked to differentiate between sticking sensation vs airway invasion. Also practiced oral intake without scope--overall swallowing behaviors and hocking did improve with continued maximal cues and reassurance.  Patient participated in the following dysphagia therapy exercises:  Effortful swallow: 25 (HEP at 20) Mendelsohn: 20 accurate repetitions, ~15 for training; patient benefited from maximal cues and mirror biofeedback  Exercises were completed at high intensity to patient-reported fatigue.>>    TODAY'S TREATMENT:  Pt saw ENT (Dr. Fredirick Maudlin) on 08/06/22 (see details above) and then saw Berneta Sages, SLP for two visits. Pt shared video imaging of his MBSS in August and February with me and the last  MBSS shows significant improvement and reduced posterior pharyngeal wall edema and reduced backflow into nasopharynx. Endoscopy completed by Dr. Berenda Morale and Berneta Sages reveals adequate airway protection and laryngeal invasion of bolus which is reassuring. Pt did present with pharyngeal residuals and Pt needed cues to inhibit "hock" and coughing and follow with only small sip of liquid as needed. Pt is eating a  wide variety of foods, liquids, textures, and consistencies, however he continues to report distress in regards to globus sensation. SLP provided ongoing education regarding possible "over reaction" to residuals and attempts to suppress coughing. Functionally, Pt appears to have mild to mild/mod pharyngeal dysphagia (cannot be certain without imaging), however he reports subjectively moderate/severe distress/discomfort. Pt has maintained weight of 154 pounds. Pt completed swallowing exercises in session with min cues from SLP with a focus on Mendelsohn, effortful swallow, and chin tuck against resistance with swallow (continue at home 3x/day). He consumed vanilla pudding, graham crackers, and sips of water with min cues for cough suppression (prn). Pt scheduled for 2x/week for 3 weeks and then plan to complete MBSS here at the end of May. He continues to make good progress, although he subjectively continues to feel discomfort/distress with certain solids (mostly meats).                                                                                                                                       DATE: 09/05/22   PLAN:   SLP FREQUENCY: 2x/week   SLP DURATION: 4 weeks   PLANNED INTERVENTIONS: Pharyngeal strengthening exercises, Diet toleration management , Trials of upgraded texture/liquids, Cueing hierachy, SLP instruction and feedback, Compensatory strategies, Patient/family education, and Re-evaluation     Thank you,  Havery Moros, CCC-SLP [phone]  Kynleigh Artz, CCC-SLP 09/05/2022, 11:03 AM

## 2022-09-17 ENCOUNTER — Ambulatory Visit (HOSPITAL_COMMUNITY): Payer: Medicaid Other | Admitting: Speech Pathology

## 2022-09-19 DIAGNOSIS — M25571 Pain in right ankle and joints of right foot: Secondary | ICD-10-CM | POA: Insufficient documentation

## 2022-09-20 ENCOUNTER — Encounter (HOSPITAL_COMMUNITY): Payer: Self-pay | Admitting: Speech Pathology

## 2022-09-20 ENCOUNTER — Other Ambulatory Visit (HOSPITAL_COMMUNITY): Payer: Self-pay | Admitting: Occupational Therapy

## 2022-09-20 ENCOUNTER — Ambulatory Visit (HOSPITAL_COMMUNITY): Payer: Medicaid Other | Attending: Orthopaedic Surgery | Admitting: Speech Pathology

## 2022-09-20 DIAGNOSIS — R1313 Dysphagia, pharyngeal phase: Secondary | ICD-10-CM

## 2022-09-20 DIAGNOSIS — R1310 Dysphagia, unspecified: Secondary | ICD-10-CM | POA: Diagnosis present

## 2022-09-20 NOTE — Therapy (Signed)
OUTPATIENT SPEECH LANGUAGE PATHOLOGY TREATMENT NOTE   Patient Name: Kenneth Gilbert MRN: [id number] DOB:[birth year], 57 y.o., male Today's Date: 09/20/2022  PCP: None on file REFERRING PROVIDER: Leonard Gilbert,  END OF SESSION:   End of Session - 09/20/22 0911     Visit Number 5    Number of Visits 11    Date for SLP Re-Evaluation 10/11/22    Authorization Type [place] Medicaid [employer]    SLP Start Time [phone]    SLP Stop Time  0945    SLP Time Calculation (min) 41 min    Activity Tolerance Patient tolerated treatment well             Past Medical History:  Diagnosis Date   Arthritis    Dizziness    ETOH abuse    Stopped drinking 4 yrs   Hard of hearing    Headache    Hyperlipidemia    Past Surgical History:  Procedure Laterality Date   arm surgery Right    arm surgery Left    COLONOSCOPY     EXTRACORPOREAL SHOCK WAVE LITHOTRIPSY Right 03/28/2021   Procedure: EXTRACORPOREAL SHOCK WAVE LITHOTRIPSY (ESWL);  Surgeon: Kenneth Gauze, MD;  Location: [facility];  Service: Urology;  Laterality: Right;   EXTRACORPOREAL SHOCK WAVE LITHOTRIPSY Left 07/11/2021   Procedure: EXTRACORPOREAL SHOCK WAVE LITHOTRIPSY (ESWL);  Surgeon: Kenneth Meager., MD;  Location: [facility];  Service: Urology;  Laterality: Left;   INNER EAR SURGERY     as a child   SHOULDER SURGERY Left    Patient Active Problem List   Diagnosis Date Noted   Stenosis of left iliac artery (HCC) 12/14/2021   Abdominal bruit 12/07/2021   Therapeutic opioid-induced constipation (OIC) 12/07/2021   Cervical spinal stenosis 06/22/2021   Ureteral calculus, right 03/21/2021   Benign prostatic hyperplasia with urinary hesitancy 03/07/2021   LVH (left ventricular hypertrophy) 11/03/2019   Diastolic dysfunction without heart failure 11/03/2019   Neuromyelopathy due to vitamin B12 deficiency (HCC) 07/17/2018   Other chronic sinusitis 06/11/2018   Vertebral artery stenosis 12/11/2017   Sensorineural  hearing loss (SNHL), bilateral 12/03/2017   Aortic atherosclerosis (HCC) 07/26/2017   Aortic ectasia, abdominal (HCC) 07/26/2017   Chronic vascular disorder of intestine (HCC) 06/12/2017   Hyperlipidemia with target LDL less than 100 06/12/2017   Tobacco abuse 06/11/2017   Nephrolithiasis 02/20/2013   GERD 07/12/2009    ONSET DATE: 12/19/2021  REFERRING DIAG: Dysphagia  THERAPY DIAG:  Dysphagia, pharyngeal phase  Rationale for Evaluation and Treatment: Rehabilitation  SUBJECTIVE:    SUBJECTIVE STATEMENT: "I am still trying to eat what I want, but it just takes a lot of effort." Pt accompanied by: self   PERTINENT HISTORY: Kenneth Gilbert is a 57 yo male who was referred for dysphagia intervention by Dr. Leonard Gilbert due to dysphagia s/p ACDF (C3-6) surgery 12/19/2021. Pt had FEES on 12/26/21 with recommendation for "runny puree diet with thin liquids". He had MBSS 02/07/22 with recommendation for "soft diet with thin liquids" and again 06/20/2022 with recommendation for "soft diet and thin liquids".   PAIN:  Are you having pain? No   OBJECTIVE:  ASSESSMENT:   CLINICAL IMPRESSION: (From evaluation 07/05/22) Patient is a 57 y.o. male who was seen today for clinical swallow evaluation and treatment due to post op (ACDF C3-6, 12/19/2021) dysphagia. Pt last had MBSS on 06/19/2022 (see above) with recommendation for soft diet and thin liquids with the following precautions: sit upright, slow rate,  take small bites/sips, liquid wash for puree/grounds/solids, cough/clear throat regularly during meals (particularly with liquids), utilize multiple swallows per bite/sip. Dysphagia therapy was recommended to address deficits (reduced airway closure, vallecular residue, decreased epiglottic deflection, and reduced UES distention). Pt reports that he has been trying to eat "whatever" he can and just cuts "challenging" solids into small bites. He reports ongoing pharyngeal globus sensation with need  for repeat swallows and liquid wash. Will plan to see Pt for dysphagia therapy. Pt's weight is reportedly "steady" at this time.      OBJECTIVE IMPAIRMENTS: include dysphagia. These impairments are limiting patient from safety when swallowing. Factors affecting potential to achieve goals and functional outcome are severity of impairments and Time post onset . Patient will benefit from skilled SLP services to address above impairments and improve overall function.   REHAB POTENTIAL: Good     GOALS: Goals reviewed with patient? Yes   SHORT TERM GOALS: Target date: 10/11/2022   Pt will consume least restrictive diet of self regulated regular textures and thin liquids with use of compensatory strategies with indirect cues. Baseline: Pt consuming soft textures, some regular Goal status: ONGOING   2.  Pt will complete pharyngeal swallowing exercises including but not limited to: masako, effortful swallow, Mendelsohn, lingual press, chin tuck against resistance, and laryngeal closure with initial model provided by SLP and daily completion of 3x per day per Pt self-report. Baseline: completing 2-3x/day Goal status: ONGOING   3.  Complete MBSS in ~8+ weeks if desired by Pt and deemed necessary by SLP/MD Baseline: Last completed on 06/20/22 Goal status: Plan for mid to end of May 2024   ENT consult 08/07/2022: <<Findings: The nasal cavity and nasopharynx are unremarkable. The tongue base, pharyngeal walls, piriform sinuses, vallecula, epiglottis and postcricoid region are normal in appearance. The visualized portion of the subglottis and proximal trachea is widely patent. The vocal folds are mobile bilaterally with good glottal closure. There are no lesions on the free edge of the vocal folds nor elsewhere in the larynx worrisome for malignancy.   Impression: Excellent bilateral vocal fold motion. No gross anatomic abnormality to explain ongoing dysphagia. Electronically signed by: Kenneth Space, MD 08/07/2022>>  Appointments with Kenneth Gilbert, SLP on 08/15/22 and 08/30/22 <<Today patient reports that he is unable to swallow. He has to continually cough things up when he is eating. Endorses choking, but denies wrong pipe sensation. Is crushing medications. He did attend ~3 sessions of swallowing therapy locally. Is currently working on Visual merchandiser for ~6 min total once daily.  Completed bioFEESback with sausage biscuit that patient brought from home. Practiced strategies of multiple swallows, liquid wash, and suppressing "hock" behavior. Patient required maximal cues for this. Limited scope tolerance so biofeedback was brief--did observe moderate-severe residue with this texture, though airway closure was intact. Following scope removal, reviewed images extensively with patient, and worked to differentiate between sticking sensation vs airway invasion. Also practiced oral intake without scope--overall swallowing behaviors and hocking did improve with continued maximal cues and reassurance.  Patient participated in the following dysphagia therapy exercises:  Effortful swallow: 25 (HEP at 20) Mendelsohn: 20 accurate repetitions, ~15 for training; patient benefited from maximal cues and mirror biofeedback  Exercises were completed at high intensity to patient-reported fatigue.>>  Previous treatment 09/05/2022:  Pt saw ENT (Dr. Fredirick Maudlin) on 08/06/22 (see details above) and then saw Berneta Sages, SLP for two visits. Pt shared video imaging of his MBSS in August and February with  me and the last MBSS shows significant improvement and reduced posterior pharyngeal wall edema and reduced backflow into nasopharynx. Endoscopy completed by Dr. Berenda Morale and Berneta Sages reveals adequate airway protection and laryngeal invasion of bolus which is reassuring. Pt did present with pharyngeal residuals and Pt needed cues to inhibit "hock" and coughing and follow with only small sip of liquid as  needed. Pt is eating a wide variety of foods, liquids, textures, and consistencies, however he continues to report distress in regards to globus sensation. SLP provided ongoing education regarding possible "over reaction" to residuals and attempts to suppress coughing. Functionally, Pt appears to have mild to mild/mod pharyngeal dysphagia (cannot be certain without imaging), however he reports subjectively moderate/severe distress/discomfort. Pt has maintained weight of 154 pounds. Pt completed swallowing exercises in session with min cues from SLP with a focus on Mendelsohn, effortful swallow, and chin tuck against resistance with swallow (continue at home 3x/day). He consumed vanilla pudding, graham crackers, and sips of water with min cues for cough suppression (prn). Pt scheduled for 2x/week for 3 weeks and then plan to complete MBSS here at the end of May. He continues to make good progress, although he subjectively continues to feel discomfort/distress with certain solids (mostly meats).    TODAY'S TREATMENT:  Pt saw Dr. Christain Sacramento yesterday, no new reports. He has maintained weight of 154 pounds. Pt completed swallowing exercises in session with min cues from SLP with a focus on Mendelsohn, effortful swallow, and chin tuck against resistance with swallow (continue at home 3x/day). He consumed 6 peanut butter crackers, applesauce, diced peaches, and sips of water with only 2 coughing episodes. Pt scheduled for MBSS at [facility] on Monday, June 3 at 11:30 AM. He continues to make good progress, although he subjectively continues to feel discomfort/distress with certain solids (mostly meats). He was asked to bring in some meat to try next session (chicken, steak, sausage). He can take small bites of meats and it seems to go down well, but larger bites cause globus sensation.                                                                                                                                        DATE: 09/20/22   PLAN:   SLP FREQUENCY: 2x/week   SLP DURATION: 4 weeks   PLANNED INTERVENTIONS: Pharyngeal strengthening exercises, Diet toleration management , Trials of upgraded texture/liquids, Cueing hierachy, SLP instruction and feedback, Compensatory strategies, Patient/family education, and Re-evaluation     Thank you,  Havery Moros, CCC-SLP [phone]  Adelin Ventrella, CCC-SLP 09/20/2022, 9:12 AM

## 2022-09-21 ENCOUNTER — Other Ambulatory Visit (HOSPITAL_COMMUNITY): Payer: Self-pay | Admitting: Occupational Therapy

## 2022-09-21 DIAGNOSIS — R1310 Dysphagia, unspecified: Secondary | ICD-10-CM

## 2022-09-26 ENCOUNTER — Encounter (HOSPITAL_COMMUNITY): Payer: Self-pay | Admitting: Speech Pathology

## 2022-09-26 ENCOUNTER — Ambulatory Visit (HOSPITAL_COMMUNITY): Payer: Medicaid Other | Admitting: Speech Pathology

## 2022-09-26 DIAGNOSIS — R1310 Dysphagia, unspecified: Secondary | ICD-10-CM

## 2022-09-26 DIAGNOSIS — R1313 Dysphagia, pharyngeal phase: Secondary | ICD-10-CM | POA: Diagnosis not present

## 2022-09-26 NOTE — Therapy (Signed)
OUTPATIENT SPEECH LANGUAGE PATHOLOGY TREATMENT NOTE   Patient Name: Kenneth Gilbert MRN: [id number] DOB:15-Dec-1965, 57 y.o., male Today's Date: 09/26/2022  PCP: Kathleen Lime, DO REFERRING PROVIDER: Leonard Schwartz, MD  END OF SESSION:   End of Session - 09/26/22 1026     Visit Number 6    Number of Visits 11    Date for SLP Re-Evaluation 10/11/22    Authorization Type [place] Medicaid [employer]    SLP Start Time [phone]    SLP Stop Time  1035    SLP Time Calculation (min) 45 min    Activity Tolerance Patient tolerated treatment well             Past Medical History:  Diagnosis Date   Arthritis    Dizziness    ETOH abuse    Stopped drinking 4 yrs   Hard of hearing    Headache    Hyperlipidemia    Past Surgical History:  Procedure Laterality Date   arm surgery Right    arm surgery Left    COLONOSCOPY     EXTRACORPOREAL SHOCK WAVE LITHOTRIPSY Right 03/28/2021   Procedure: EXTRACORPOREAL SHOCK WAVE LITHOTRIPSY (ESWL);  Surgeon: Malen Gauze, MD;  Location: [facility];  Service: Urology;  Laterality: Right;   EXTRACORPOREAL SHOCK WAVE LITHOTRIPSY Left 07/11/2021   Procedure: EXTRACORPOREAL SHOCK WAVE LITHOTRIPSY (ESWL);  Surgeon: Milderd Meager., MD;  Location: [facility];  Service: Urology;  Laterality: Left;   INNER EAR SURGERY     as a child   SHOULDER SURGERY Left    Patient Active Problem List   Diagnosis Date Noted   Stenosis of left iliac artery (HCC) 12/14/2021   Abdominal bruit 12/07/2021   Therapeutic opioid-induced constipation (OIC) 12/07/2021   Cervical spinal stenosis 06/22/2021   Ureteral calculus, right 03/21/2021   Benign prostatic hyperplasia with urinary hesitancy 03/07/2021   LVH (left ventricular hypertrophy) 11/03/2019   Diastolic dysfunction without heart failure 11/03/2019   Neuromyelopathy due to vitamin B12 deficiency (HCC) 07/17/2018   Other chronic sinusitis 06/11/2018   Vertebral artery stenosis 12/11/2017    Sensorineural hearing loss (SNHL), bilateral 12/03/2017   Aortic atherosclerosis (HCC) 07/26/2017   Aortic ectasia, abdominal (HCC) 07/26/2017   Chronic vascular disorder of intestine (HCC) 06/12/2017   Hyperlipidemia with target LDL less than 100 06/12/2017   Tobacco abuse 06/11/2017   Nephrolithiasis 02/20/2013   GERD 07/12/2009    ONSET DATE: 12/19/2021  REFERRING DIAG: Dysphagia  THERAPY DIAG:  Dysphagia, unspecified type  Rationale for Evaluation and Treatment: Rehabilitation  SUBJECTIVE:    SUBJECTIVE STATEMENT: "I do well with rice, but meats are difficult."  Pt accompanied by: self   PERTINENT HISTORY: Kenneth Gilbert is a 57 yo male who was referred for dysphagia intervention by Dr. Leonard Schwartz due to dysphagia s/p ACDF (C3-6) surgery 12/19/2021. Pt had FEES on 12/26/21 with recommendation for "runny puree diet with thin liquids". He had MBSS 02/07/22 with recommendation for "soft diet with thin liquids" and again 06/20/2022 with recommendation for "soft diet and thin liquids".   PAIN:  Are you having pain? No   OBJECTIVE:  ASSESSMENT:   CLINICAL IMPRESSION: (From evaluation 07/05/22) Patient is a 57 y.o. male who was seen today for clinical swallow evaluation and treatment due to post op (ACDF C3-6, 12/19/2021) dysphagia. Pt last had MBSS on 06/19/2022 (see above) with recommendation for soft diet and thin liquids with the following precautions: sit upright, slow rate, take small bites/sips, liquid wash  for puree/grounds/solids, cough/clear throat regularly during meals (particularly with liquids), utilize multiple swallows per bite/sip. Dysphagia therapy was recommended to address deficits (reduced airway closure, vallecular residue, decreased epiglottic deflection, and reduced UES distention). Pt reports that he has been trying to eat "whatever" he can and just cuts "challenging" solids into small bites. He reports ongoing pharyngeal globus sensation with need for repeat  swallows and liquid wash. Will plan to see Pt for dysphagia therapy. Pt's weight is reportedly "steady" at this time.      OBJECTIVE IMPAIRMENTS: include dysphagia. These impairments are limiting patient from safety when swallowing. Factors affecting potential to achieve goals and functional outcome are severity of impairments and Time post onset . Patient will benefit from skilled SLP services to address above impairments and improve overall function.   REHAB POTENTIAL: Good     GOALS: Goals reviewed with patient? Yes   SHORT TERM GOALS: Target date: 10/11/2022   Pt will consume least restrictive diet of self regulated regular textures and thin liquids with use of compensatory strategies with indirect cues. Baseline: Pt consuming soft textures, some regular Goal status: ONGOING   2.  Pt will complete pharyngeal swallowing exercises including but not limited to: masako, effortful swallow, Mendelsohn, lingual press, chin tuck against resistance, and laryngeal closure with initial model provided by SLP and daily completion of 3x per day per Pt self-report. Baseline: completing 2-3x/day Goal status: ONGOING   3.  Complete MBSS in ~8+ weeks if desired by Pt and deemed necessary by SLP/MD Baseline: Last completed on 06/20/22 Goal status: Plan for mid to end of May 2024   ENT consult 08/07/2022: <<Findings: The nasal cavity and nasopharynx are unremarkable. The tongue base, pharyngeal walls, piriform sinuses, vallecula, epiglottis and postcricoid region are normal in appearance. The visualized portion of the subglottis and proximal trachea is widely patent. The vocal folds are mobile bilaterally with good glottal closure. There are no lesions on the free edge of the vocal folds nor elsewhere in the larynx worrisome for malignancy.   Impression: Excellent bilateral vocal fold motion. No gross anatomic abnormality to explain ongoing dysphagia. Electronically signed by: Noralee Space, MD  08/07/2022>>  Appointments with Adora Fridge, SLP on 08/15/22 and 08/30/22 <<Today patient reports that he is unable to swallow. He has to continually cough things up when he is eating. Endorses choking, but denies wrong pipe sensation. Is crushing medications. He did attend ~3 sessions of swallowing therapy locally. Is currently working on Visual merchandiser for ~6 min total once daily.  Completed bioFEESback with sausage biscuit that patient brought from home. Practiced strategies of multiple swallows, liquid wash, and suppressing "hock" behavior. Patient required maximal cues for this. Limited scope tolerance so biofeedback was brief--did observe moderate-severe residue with this texture, though airway closure was intact. Following scope removal, reviewed images extensively with patient, and worked to differentiate between sticking sensation vs airway invasion. Also practiced oral intake without scope--overall swallowing behaviors and hocking did improve with continued maximal cues and reassurance.  Patient participated in the following dysphagia therapy exercises:  Effortful swallow: 25 (HEP at 20) Mendelsohn: 20 accurate repetitions, ~15 for training; patient benefited from maximal cues and mirror biofeedback  Exercises were completed at high intensity to patient-reported fatigue.>>  Previous treatment 09/05/2022:  Pt saw ENT (Dr. Fredirick Maudlin) on 08/06/22 (see details above) and then saw Berneta Sages, SLP for two visits. Pt shared video imaging of his MBSS in August and February with me and the last MBSS  shows significant improvement and reduced posterior pharyngeal wall edema and reduced backflow into nasopharynx. Endoscopy completed by Dr. Berenda Morale and Berneta Sages reveals adequate airway protection and laryngeal invasion of bolus which is reassuring. Pt did present with pharyngeal residuals and Pt needed cues to inhibit "hock" and coughing and follow with only small sip of liquid as needed. Pt is  eating a wide variety of foods, liquids, textures, and consistencies, however he continues to report distress in regards to globus sensation. SLP provided ongoing education regarding possible "over reaction" to residuals and attempts to suppress coughing. Functionally, Pt appears to have mild to mild/mod pharyngeal dysphagia (cannot be certain without imaging), however he reports subjectively moderate/severe distress/discomfort. Pt has maintained weight of 154 pounds. Pt completed swallowing exercises in session with min cues from SLP with a focus on Mendelsohn, effortful swallow, and chin tuck against resistance with swallow (continue at home 3x/day). He consumed vanilla pudding, graham crackers, and sips of water with min cues for cough suppression (prn). Pt scheduled for 2x/week for 3 weeks and then plan to complete MBSS here at the end of May. He continues to make good progress, although he subjectively continues to feel discomfort/distress with certain solids (mostly meats).    TODAY'S TREATMENT:  Pt came to therapy using a crutch due to pain in his left knee. He fell on Saturday, May 4 and landed on his knee. It is now swollen, painful, and warm to the touch. He was encouraged to call his PCP as soon as possible. He has maintained weight of 154 pounds. He reports completion of swallowing exercises at home. He brought rice and chicken to session today and consumed ~1 cup with two episodes of throat clear/cough. He had more difficulty with larger pieces of chicken, but was able to masticate and swallow. Pt uses water sips for liquid wash as needed. Pt to continue with swallowing exercises and eating a variety of textures. MBSS scheduled for June 3.                                                                                                                                        DATE: 09/26/22   PLAN:   SLP FREQUENCY: 2x/week   SLP DURATION: 4 weeks   PLANNED INTERVENTIONS: Pharyngeal  strengthening exercises, Diet toleration management , Trials of upgraded texture/liquids, Cueing hierachy, SLP instruction and feedback, Compensatory strategies, Patient/family education, and Re-evaluation     Thank you,  Havery Moros, CCC-SLP [phone]  Clora Ohmer, CCC-SLP 09/26/2022, 10:26 AM

## 2022-10-01 ENCOUNTER — Encounter (HOSPITAL_COMMUNITY): Payer: Self-pay | Admitting: Speech Pathology

## 2022-10-01 ENCOUNTER — Ambulatory Visit (HOSPITAL_COMMUNITY): Payer: Medicaid Other | Admitting: Speech Pathology

## 2022-10-01 DIAGNOSIS — R1310 Dysphagia, unspecified: Secondary | ICD-10-CM

## 2022-10-01 DIAGNOSIS — R1313 Dysphagia, pharyngeal phase: Secondary | ICD-10-CM | POA: Diagnosis not present

## 2022-10-01 NOTE — Therapy (Signed)
OUTPATIENT SPEECH LANGUAGE PATHOLOGY TREATMENT NOTE   Patient Name: Kenneth Gilbert MRN: [id number] DOB:[birth year], 57 y.o., male Today's Date: 10/01/2022  PCP: Kathleen Lime, DO REFERRING PROVIDER: Leonard Schwartz, MD  END OF SESSION:   End of Session - 10/01/22 0928     Visit Number 7    Number of Visits 11    Date for SLP Re-Evaluation 10/11/22    Authorization Type [place] Medicaid [employer]    SLP Start Time 0909    SLP Stop Time  0950    SLP Time Calculation (min) 41 min    Activity Tolerance Patient tolerated treatment well             Past Medical History:  Diagnosis Date   Arthritis    Dizziness    ETOH abuse    Stopped drinking 4 yrs   Hard of hearing    Headache    Hyperlipidemia    Past Surgical History:  Procedure Laterality Date   arm surgery Right    arm surgery Left    COLONOSCOPY     EXTRACORPOREAL SHOCK WAVE LITHOTRIPSY Right 03/28/2021   Procedure: EXTRACORPOREAL SHOCK WAVE LITHOTRIPSY (ESWL);  Surgeon: Malen Gauze, MD;  Location: [facility];  Service: Urology;  Laterality: Right;   EXTRACORPOREAL SHOCK WAVE LITHOTRIPSY Left 07/11/2021   Procedure: EXTRACORPOREAL SHOCK WAVE LITHOTRIPSY (ESWL);  Surgeon: Milderd Meager., MD;  Location: [facility];  Service: Urology;  Laterality: Left;   INNER EAR SURGERY     as a child   SHOULDER SURGERY Left    Patient Active Problem List   Diagnosis Date Noted   Stenosis of left iliac artery (HCC) 12/14/2021   Abdominal bruit 12/07/2021   Therapeutic opioid-induced constipation (OIC) 12/07/2021   Cervical spinal stenosis 06/22/2021   Ureteral calculus, right 03/21/2021   Benign prostatic hyperplasia with urinary hesitancy 03/07/2021   LVH (left ventricular hypertrophy) 11/03/2019   Diastolic dysfunction without heart failure 11/03/2019   Neuromyelopathy due to vitamin B12 deficiency (HCC) 07/17/2018   Other chronic sinusitis 06/11/2018   Vertebral artery stenosis 12/11/2017    Sensorineural hearing loss (SNHL), bilateral 12/03/2017   Aortic atherosclerosis (HCC) 07/26/2017   Aortic ectasia, abdominal (HCC) 07/26/2017   Chronic vascular disorder of intestine (HCC) 06/12/2017   Hyperlipidemia with target LDL less than 100 06/12/2017   Tobacco abuse 06/11/2017   Nephrolithiasis 02/20/2013   GERD 07/12/2009    ONSET DATE: 12/19/2021  REFERRING DIAG: Dysphagia  THERAPY DIAG:  Dysphagia, unspecified type  Rationale for Evaluation and Treatment: Rehabilitation  SUBJECTIVE:    SUBJECTIVE STATEMENT: "This steak feels like it sticks."  Pt accompanied by: self   PERTINENT HISTORY: Kenneth Gilbert is a 57 yo male who was referred for dysphagia intervention by Dr. Leonard Schwartz due to dysphagia s/p ACDF (C3-6) surgery 12/19/2021. Pt had FEES on 12/26/21 with recommendation for "runny puree diet with thin liquids". He had MBSS 02/07/22 with recommendation for "soft diet with thin liquids" and again 06/20/2022 with recommendation for "soft diet and thin liquids".   PAIN:  Are you having pain? No   OBJECTIVE:  ASSESSMENT:   CLINICAL IMPRESSION: (From evaluation 07/05/22) Patient is a 57 y.o. male who was seen today for clinical swallow evaluation and treatment due to post op (ACDF C3-6, 12/19/2021) dysphagia. Pt last had MBSS on 06/19/2022 (see above) with recommendation for soft diet and thin liquids with the following precautions: sit upright, slow rate, take small bites/sips, liquid wash for puree/grounds/solids, cough/clear  throat regularly during meals (particularly with liquids), utilize multiple swallows per bite/sip. Dysphagia therapy was recommended to address deficits (reduced airway closure, vallecular residue, decreased epiglottic deflection, and reduced UES distention). Pt reports that he has been trying to eat "whatever" he can and just cuts "challenging" solids into small bites. He reports ongoing pharyngeal globus sensation with need for repeat swallows and  liquid wash. Will plan to see Pt for dysphagia therapy. Pt's weight is reportedly "steady" at this time.      OBJECTIVE IMPAIRMENTS: include dysphagia. These impairments are limiting patient from safety when swallowing. Factors affecting potential to achieve goals and functional outcome are severity of impairments and Time post onset . Patient will benefit from skilled SLP services to address above impairments and improve overall function.   REHAB POTENTIAL: Good     GOALS: Goals reviewed with patient? Yes   SHORT TERM GOALS: Target date: 10/11/2022   Pt will consume least restrictive diet of self regulated regular textures and thin liquids with use of compensatory strategies with indirect cues. Baseline: Pt consuming soft textures, some regular Goal status: ONGOING   2.  Pt will complete pharyngeal swallowing exercises including but not limited to: masako, effortful swallow, Mendelsohn, lingual press, chin tuck against resistance, and laryngeal closure with initial model provided by SLP and daily completion of 3x per day per Pt self-report. Baseline: completing 2-3x/day Goal status: ONGOING   3.  Complete MBSS in ~8+ weeks if desired by Pt and deemed necessary by SLP/MD Baseline: Last completed on 06/20/22 Goal status: Plan for mid to end of May 2024   ENT consult 08/07/2022: <<Findings: The nasal cavity and nasopharynx are unremarkable. The tongue base, pharyngeal walls, piriform sinuses, vallecula, epiglottis and postcricoid region are normal in appearance. The visualized portion of the subglottis and proximal trachea is widely patent. The vocal folds are mobile bilaterally with good glottal closure. There are no lesions on the free edge of the vocal folds nor elsewhere in the larynx worrisome for malignancy.   Impression: Excellent bilateral vocal fold motion. No gross anatomic abnormality to explain ongoing dysphagia. Electronically signed by: Noralee Space, MD  08/07/2022>>  Appointments with Adora Fridge, SLP on 08/15/22 and 08/30/22 <<Today patient reports that he is unable to swallow. He has to continually cough things up when he is eating. Endorses choking, but denies wrong pipe sensation. Is crushing medications. He did attend ~3 sessions of swallowing therapy locally. Is currently working on Visual merchandiser for ~6 min total once daily.  Completed bioFEESback with sausage biscuit that patient brought from home. Practiced strategies of multiple swallows, liquid wash, and suppressing "hock" behavior. Patient required maximal cues for this. Limited scope tolerance so biofeedback was brief--did observe moderate-severe residue with this texture, though airway closure was intact. Following scope removal, reviewed images extensively with patient, and worked to differentiate between sticking sensation vs airway invasion. Also practiced oral intake without scope--overall swallowing behaviors and hocking did improve with continued maximal cues and reassurance.  Patient participated in the following dysphagia therapy exercises:  Effortful swallow: 25 (HEP at 20) Mendelsohn: 20 accurate repetitions, ~15 for training; patient benefited from maximal cues and mirror biofeedback  Exercises were completed at high intensity to patient-reported fatigue.>>  Previous treatment 09/05/2022:  Pt saw ENT (Dr. Fredirick Maudlin) on 08/06/22 (see details above) and then saw Berneta Sages, SLP for two visits. Pt shared video imaging of his MBSS in August and February with me and the last MBSS shows significant improvement  and reduced posterior pharyngeal wall edema and reduced backflow into nasopharynx. Endoscopy completed by Dr. Berenda Morale and Berneta Sages reveals adequate airway protection and laryngeal invasion of bolus which is reassuring. Pt did present with pharyngeal residuals and Pt needed cues to inhibit "hock" and coughing and follow with only small sip of liquid as needed. Pt is  eating a wide variety of foods, liquids, textures, and consistencies, however he continues to report distress in regards to globus sensation. SLP provided ongoing education regarding possible "over reaction" to residuals and attempts to suppress coughing. Functionally, Pt appears to have mild to mild/mod pharyngeal dysphagia (cannot be certain without imaging), however he reports subjectively moderate/severe distress/discomfort. Pt has maintained weight of 154 pounds. Pt completed swallowing exercises in session with min cues from SLP with a focus on Mendelsohn, effortful swallow, and chin tuck against resistance with swallow (continue at home 3x/day). He consumed vanilla pudding, graham crackers, and sips of water with min cues for cough suppression (prn). Pt scheduled for 2x/week for 3 weeks and then plan to complete MBSS here at the end of May. He continues to make good progress, although he subjectively continues to feel discomfort/distress with certain solids (mostly meats).    TODAY'S TREATMENT:  His left knee is still swollen and painful to the touch. He left a message for his doctor on Friday, but has not heard back. He sees another doctor tomorrow and he was advised to talk to him about his knee. He has maintained weight of 154 pounds. He reports completion of swallowing exercises at home. He brought rice and steak cubes to session today and consumed in session. He reported globus sensation and was encouraged to repeat swallow and follow with puree wash and/or water. He indicates that the puree wash did not seem beneficial. SLP examined dentition which is in very poor repair. He reported that he was going to have his teeth extracted before his surgery last August, but was unable to do so. He will need to finely chop his meats to aid in swallow due to lack of molars and dentition in poor repair. He was encouraged to focus on completion of chin tuck against resistance exercises daily. Pt to continue with  swallowing exercises and eating a variety of textures. MBSS scheduled for June 3.                                                                                                                                        DATE: 10/01/22   PLAN:   SLP FREQUENCY: 2x/week   SLP DURATION: 4 weeks   PLANNED INTERVENTIONS: Pharyngeal strengthening exercises, Diet toleration management , Trials of upgraded texture/liquids, Cueing hierachy, SLP instruction and feedback, Compensatory strategies, Patient/family education, and Re-evaluation     Thank you,  Havery Moros, CCC-SLP [phone]  Jaielle Dlouhy, CCC-SLP 10/01/2022, 9:29 AM

## 2022-10-04 ENCOUNTER — Encounter (HOSPITAL_COMMUNITY): Payer: Self-pay | Admitting: Speech Pathology

## 2022-10-04 ENCOUNTER — Ambulatory Visit (HOSPITAL_COMMUNITY): Payer: Medicaid Other | Admitting: Speech Pathology

## 2022-10-04 DIAGNOSIS — R1313 Dysphagia, pharyngeal phase: Secondary | ICD-10-CM | POA: Diagnosis not present

## 2022-10-04 DIAGNOSIS — R1310 Dysphagia, unspecified: Secondary | ICD-10-CM

## 2022-10-04 NOTE — Therapy (Signed)
OUTPATIENT SPEECH LANGUAGE PATHOLOGY TREATMENT NOTE   Patient Name: Kenneth Gilbert MRN: [id number] DOB:[birth year], 57 y.o., male Today's Date: 10/04/2022  PCP: Kathleen Lime, DO REFERRING PROVIDER: Leonard Schwartz, MD  END OF SESSION:   End of Session - 10/04/22 0911     Visit Number 8    Number of Visits 11    Date for SLP Re-Evaluation 10/11/22    Authorization Type [place] Medicaid [employer]    SLP Start Time [phone]    SLP Stop Time  0945    SLP Time Calculation (min) 40 min    Activity Tolerance Patient tolerated treatment well             Past Medical History:  Diagnosis Date   Arthritis    Dizziness    ETOH abuse    Stopped drinking 4 yrs   Hard of hearing    Headache    Hyperlipidemia    Past Surgical History:  Procedure Laterality Date   arm surgery Right    arm surgery Left    COLONOSCOPY     EXTRACORPOREAL SHOCK WAVE LITHOTRIPSY Right 03/28/2021   Procedure: EXTRACORPOREAL SHOCK WAVE LITHOTRIPSY (ESWL);  Surgeon: Malen Gauze, MD;  Location: [facility];  Service: Urology;  Laterality: Right;   EXTRACORPOREAL SHOCK WAVE LITHOTRIPSY Left 07/11/2021   Procedure: EXTRACORPOREAL SHOCK WAVE LITHOTRIPSY (ESWL);  Surgeon: Milderd Meager., MD;  Location: [facility];  Service: Urology;  Laterality: Left;   INNER EAR SURGERY     as a child   SHOULDER SURGERY Left    Patient Active Problem List   Diagnosis Date Noted   Stenosis of left iliac artery (HCC) 12/14/2021   Abdominal bruit 12/07/2021   Therapeutic opioid-induced constipation (OIC) 12/07/2021   Cervical spinal stenosis 06/22/2021   Ureteral calculus, right 03/21/2021   Benign prostatic hyperplasia with urinary hesitancy 03/07/2021   LVH (left ventricular hypertrophy) 11/03/2019   Diastolic dysfunction without heart failure 11/03/2019   Neuromyelopathy due to vitamin B12 deficiency (HCC) 07/17/2018   Other chronic sinusitis 06/11/2018   Vertebral artery stenosis 12/11/2017    Sensorineural hearing loss (SNHL), bilateral 12/03/2017   Aortic atherosclerosis (HCC) 07/26/2017   Aortic ectasia, abdominal (HCC) 07/26/2017   Chronic vascular disorder of intestine (HCC) 06/12/2017   Hyperlipidemia with target LDL less than 100 06/12/2017   Tobacco abuse 06/11/2017   Nephrolithiasis 02/20/2013   GERD 07/12/2009    ONSET DATE: 12/19/2021  REFERRING DIAG: Dysphagia  THERAPY DIAG:  Dysphagia, unspecified type  Rationale for Evaluation and Treatment: Rehabilitation  SUBJECTIVE:    SUBJECTIVE STATEMENT: "I can chew things up well, but the meat still sticks."  Pt accompanied by: self   PERTINENT HISTORY: Kenneth Gilbert is a 57 yo male who was referred for dysphagia intervention by Dr. Leonard Schwartz due to dysphagia s/p ACDF (C3-6) surgery 12/19/2021. Pt had FEES on 12/26/21 with recommendation for "runny puree diet with thin liquids". He had MBSS 02/07/22 with recommendation for "soft diet with thin liquids" and again 06/20/2022 with recommendation for "soft diet and thin liquids".   PAIN:  Are you having pain? No   OBJECTIVE:  ASSESSMENT:   CLINICAL IMPRESSION: (From evaluation 07/05/22) Patient is a 57 y.o. male who was seen today for clinical swallow evaluation and treatment due to post op (ACDF C3-6, 12/19/2021) dysphagia. Pt last had MBSS on 06/19/2022 (see above) with recommendation for soft diet and thin liquids with the following precautions: sit upright, slow rate, take small bites/sips,  liquid wash for puree/grounds/solids, cough/clear throat regularly during meals (particularly with liquids), utilize multiple swallows per bite/sip. Dysphagia therapy was recommended to address deficits (reduced airway closure, vallecular residue, decreased epiglottic deflection, and reduced UES distention). Pt reports that he has been trying to eat "whatever" he can and just cuts "challenging" solids into small bites. He reports ongoing pharyngeal globus sensation with need for  repeat swallows and liquid wash. Will plan to see Pt for dysphagia therapy. Pt's weight is reportedly "steady" at this time.      OBJECTIVE IMPAIRMENTS: include dysphagia. These impairments are limiting patient from safety when swallowing. Factors affecting potential to achieve goals and functional outcome are severity of impairments and Time post onset . Patient will benefit from skilled SLP services to address above impairments and improve overall function.   REHAB POTENTIAL: Good     GOALS: Goals reviewed with patient? Yes   SHORT TERM GOALS: Target date: 10/11/2022   Pt will consume least restrictive diet of self regulated regular textures and thin liquids with use of compensatory strategies with indirect cues. Baseline: Pt consuming soft textures, some regular Goal status: ONGOING   2.  Pt will complete pharyngeal swallowing exercises including but not limited to: masako, effortful swallow, Mendelsohn, lingual press, chin tuck against resistance, and laryngeal closure with initial model provided by SLP and daily completion of 3x per day per Pt self-report. Baseline: completing 2-3x/day Goal status: ONGOING   3.  Complete MBSS in ~8+ weeks if desired by Pt and deemed necessary by SLP/MD Baseline: Last completed on 06/20/22 Goal status: Plan for mid to end of May 2024   ENT consult 08/07/2022: <<Findings: The nasal cavity and nasopharynx are unremarkable. The tongue base, pharyngeal walls, piriform sinuses, vallecula, epiglottis and postcricoid region are normal in appearance. The visualized portion of the subglottis and proximal trachea is widely patent. The vocal folds are mobile bilaterally with good glottal closure. There are no lesions on the free edge of the vocal folds nor elsewhere in the larynx worrisome for malignancy.   Impression: Excellent bilateral vocal fold motion. No gross anatomic abnormality to explain ongoing dysphagia. Electronically signed by: Noralee Space,  MD 08/07/2022>>  Appointments with Adora Fridge, SLP on 08/15/22 and 08/30/22 <<Today patient reports that he is unable to swallow. He has to continually cough things up when he is eating. Endorses choking, but denies wrong pipe sensation. Is crushing medications. He did attend ~3 sessions of swallowing therapy locally. Is currently working on Visual merchandiser for ~6 min total once daily.  Completed bioFEESback with sausage biscuit that patient brought from home. Practiced strategies of multiple swallows, liquid wash, and suppressing "hock" behavior. Patient required maximal cues for this. Limited scope tolerance so biofeedback was brief--did observe moderate-severe residue with this texture, though airway closure was intact. Following scope removal, reviewed images extensively with patient, and worked to differentiate between sticking sensation vs airway invasion. Also practiced oral intake without scope--overall swallowing behaviors and hocking did improve with continued maximal cues and reassurance.  Patient participated in the following dysphagia therapy exercises:  Effortful swallow: 25 (HEP at 20) Mendelsohn: 20 accurate repetitions, ~15 for training; patient benefited from maximal cues and mirror biofeedback  Exercises were completed at high intensity to patient-reported fatigue.>>  Previous treatment 09/05/2022:  Pt saw ENT (Dr. Fredirick Maudlin) on 08/06/22 (see details above) and then saw Berneta Sages, SLP for two visits. Pt shared video imaging of his MBSS in August and February with me and the  last MBSS shows significant improvement and reduced posterior pharyngeal wall edema and reduced backflow into nasopharynx. Endoscopy completed by Dr. Berenda Morale and Berneta Sages reveals adequate airway protection and laryngeal invasion of bolus which is reassuring. Pt did present with pharyngeal residuals and Pt needed cues to inhibit "hock" and coughing and follow with only small sip of liquid as needed. Pt  is eating a wide variety of foods, liquids, textures, and consistencies, however he continues to report distress in regards to globus sensation. SLP provided ongoing education regarding possible "over reaction" to residuals and attempts to suppress coughing. Functionally, Pt appears to have mild to mild/mod pharyngeal dysphagia (cannot be certain without imaging), however he reports subjectively moderate/severe distress/discomfort. Pt has maintained weight of 154 pounds. Pt completed swallowing exercises in session with min cues from SLP with a focus on Mendelsohn, effortful swallow, and chin tuck against resistance with swallow (continue at home 3x/day). He consumed vanilla pudding, graham crackers, and sips of water with min cues for cough suppression (prn). Pt scheduled for 2x/week for 3 weeks and then plan to complete MBSS here at the end of May. He continues to make good progress, although he subjectively continues to feel discomfort/distress with certain solids (mostly meats).    TODAY'S TREATMENT:  Pt moving better today and his knee is less swollen, however he said the doctor "didn't do anything about it". His grandson was with him in our session today. Pt completed SLP guided swallowing exercises which included effortful swallow, Mendelsohn, lingual press, and chin tuck against resistance for 10+ reps each. Pt with good execution of each exercise with min cues provided. He was encouraged to focus on completion of chin tuck against resistance exercises daily. Pt to continue with swallowing exercises and eating a variety of textures. MBSS scheduled for June 3.                                                                                                                                        DATE: 10/04/22   PLAN:   SLP FREQUENCY: 2x/week   SLP DURATION: 4 weeks   PLANNED INTERVENTIONS: Pharyngeal strengthening exercises, Diet toleration management , Trials of upgraded texture/liquids, Cueing  hierachy, SLP instruction and feedback, Compensatory strategies, Patient/family education, and Re-evaluation     Thank you,  Havery Moros, CCC-SLP [phone]  Kleber Crean, CCC-SLP 10/04/2022, 9:12 AM

## 2022-10-09 DIAGNOSIS — M47816 Spondylosis without myelopathy or radiculopathy, lumbar region: Secondary | ICD-10-CM | POA: Insufficient documentation

## 2022-10-15 ENCOUNTER — Ambulatory Visit (HOSPITAL_COMMUNITY): Payer: Medicaid Other | Attending: Orthopaedic Surgery | Admitting: Speech Pathology

## 2022-10-15 ENCOUNTER — Ambulatory Visit (HOSPITAL_COMMUNITY)
Admission: RE | Admit: 2022-10-15 | Discharge: 2022-10-15 | Disposition: A | Discharge status: Home / Self Care | Payer: Medicaid Other | Source: Ambulatory Visit | Attending: Orthopaedic Surgery | Admitting: Orthopaedic Surgery

## 2022-10-15 DIAGNOSIS — R1310 Dysphagia, unspecified: Secondary | ICD-10-CM | POA: Diagnosis present

## 2022-10-18 NOTE — Therapy (Addendum)
Modified Barium Swallow Study  Patient Details  Name: Kenneth Gilbert MRN: [id number] Date of Birth: 01/28/1966  Today's Date: 10/15/2022  Modified Barium Swallow completed.  Full report located under Chart Review in the Imaging Section.  History of Present Illness Daxton "Ray" Hoggarth is a 57 yo male who was referred for dysphagia intervention by Dr. Leonard Schwartz due to dysphagia s/p ACDF (C3-6) surgery 12/19/2021. Pt had FEES on 12/26/21 with recommendation for "runny puree diet with thin liquids". He had MBSS 02/07/22 with recommendation for "soft diet with thin liquids" and again 06/20/2022 with recommendation for "soft diet and thin liquids".     Clinical Impression Pt presents with mild oral phase and mi/mod pharyngeal phase dysphagia characterized by impaired lingual movement and mastication with slow bolus movement and piecemeal deglutition with oral residuals necessitating repeat swallows to clear; pharyngeal phase is marked by swallow trigger before the valleculae, reduced hyolaryngeal excursion, tongue base retraction, epiglottic deflection, and posterior pharyngeal wall pressure (diminished pharyngeal stripping wave) resulting in glash penetration of thins after the swallow (from residuals in valleculae) and moderate vallecular residue with puree and solids. Pt is able to clear vallecular residue with repeat/dry swallows (up to 4 swallows to clear) and tends to bring some solids back up to oropharynx from the valleculae in order to repeat swallow (he did this with steak pieces). Pt with slightly improved vallecular clearance when he turns his head to the RIGHT when he swallows solid foods. AP view completed and shows that the bolus predominantly traverses down the left side of pharynx. The imaging from this study was reviewed with Pt and compared to some images from his MBSS in February that he had on his phone. Slight improvement noted in epiglottic deflection and vallecular clearance,  however these deficits persist. Pt is able to eat and drink most foods, but needs to modify by cutting meats into very small pieces and add moisture. He does not appear to be at great risk for aspiration and protects his airway well. Recommend that Pt continue to eat a variety of textures and continue with swallowing exercises in the hopes of gradual improvement in pharyngeal clearance (swallow efficiency) with more time. Pt expresses frustration regarding his dysphagia and SLP continued to encourage him. He was able to swallow the barium tablet with water without incident. SLP also suggested that Pt try head turn to the RIGHT with more challenging solids (chew meats well) and swallow. Plan to have Pt return for follow up in my office in 2-3 weeks. His recovery will continue to take time, but he has shown improvement. Factors that may increase risk of adverse event in presence of aspiration Rubye Oaks & Clearance Coots 2021):    Swallow Evaluation Recommendations Recommendations: PO diet PO Diet Recommendation: Regular;Thin liquids (Level 0) (chop meats well) Liquid Administration via: Cup Medication Administration: Whole meds with liquid Supervision: Patient able to self-feed Swallowing strategies  : Small bites/sips;Multiple dry swallows after each bite/sip;Head turn right during swallowing (trial head turn Right with solids) Postural changes: Position pt fully upright for meals;Stay upright 30-60 min after meals Oral care recommendations: Oral care BID (2x/day)     Thank you,  Havery Moros, CCC-SLP [phone]  Cassity Christian 10/15/2022,4:13 PM

## 2022-11-21 ENCOUNTER — Encounter (HOSPITAL_COMMUNITY): Payer: Self-pay | Admitting: Speech Pathology

## 2022-11-21 ENCOUNTER — Ambulatory Visit (HOSPITAL_COMMUNITY): Payer: Medicaid Other | Attending: Orthopaedic Surgery | Admitting: Speech Pathology

## 2022-11-21 DIAGNOSIS — R1313 Dysphagia, pharyngeal phase: Secondary | ICD-10-CM | POA: Diagnosis present

## 2022-11-21 NOTE — Therapy (Signed)
OUTPATIENT SPEECH LANGUAGE PATHOLOGY TREATMENT NOTE   Patient Name: Kenneth Gilbert MRN: [id number] DOB:10-28-1965, 57 y.o., male Today's Date: 11/21/2022  PCP: Kathleen Lime, DO REFERRING PROVIDER: Leonard Schwartz, MD  END OF SESSION:   End of Session - 11/21/22 1156     Visit Number 10    Number of Visits 11    Date for SLP Re-Evaluation 11/21/22    Authorization Type [place] Medicaid [employer]    SLP Start Time 0900    SLP Stop Time  0945    SLP Time Calculation (min) 45 min    Activity Tolerance Patient tolerated treatment well             Past Medical History:  Diagnosis Date   Arthritis    Dizziness    ETOH abuse    Stopped drinking 4 yrs   Hard of hearing    Headache    Hyperlipidemia    Past Surgical History:  Procedure Laterality Date   arm surgery Right    arm surgery Left    COLONOSCOPY     EXTRACORPOREAL SHOCK WAVE LITHOTRIPSY Right 03/28/2021   Procedure: EXTRACORPOREAL SHOCK WAVE LITHOTRIPSY (ESWL);  Surgeon: Malen Gauze, MD;  Location: [facility];  Service: Urology;  Laterality: Right;   EXTRACORPOREAL SHOCK WAVE LITHOTRIPSY Left 07/11/2021   Procedure: EXTRACORPOREAL SHOCK WAVE LITHOTRIPSY (ESWL);  Surgeon: Milderd Meager., MD;  Location: [facility];  Service: Urology;  Laterality: Left;   INNER EAR SURGERY     as a child   SHOULDER SURGERY Left    Patient Active Problem List   Diagnosis Date Noted   Stenosis of left iliac artery (HCC) 12/14/2021   Abdominal bruit 12/07/2021   Therapeutic opioid-induced constipation (OIC) 12/07/2021   Cervical spinal stenosis 06/22/2021   Ureteral calculus, right 03/21/2021   Benign prostatic hyperplasia with urinary hesitancy 03/07/2021   LVH (left ventricular hypertrophy) 11/03/2019   Diastolic dysfunction without heart failure 11/03/2019   Neuromyelopathy due to vitamin B12 deficiency (HCC) 07/17/2018   Other chronic sinusitis 06/11/2018   Vertebral artery stenosis 12/11/2017    Sensorineural hearing loss (SNHL), bilateral 12/03/2017   Aortic atherosclerosis (HCC) 07/26/2017   Aortic ectasia, abdominal (HCC) 07/26/2017   Chronic vascular disorder of intestine (HCC) 06/12/2017   Hyperlipidemia with target LDL less than 100 06/12/2017   Tobacco abuse 06/11/2017   Nephrolithiasis 02/20/2013   GERD 07/12/2009    ONSET DATE: 12/19/2021  REFERRING DIAG: Dysphagia  THERAPY DIAG:  Dysphagia, pharyngeal phase  Rationale for Evaluation and Treatment: Rehabilitation  SUBJECTIVE:    SUBJECTIVE STATEMENT: "I can chew things up well, but the meat still sticks."  Pt accompanied by: self   PERTINENT HISTORY: Kenneth Gilbert is a 57 yo male who was referred for dysphagia intervention by Dr. Leonard Schwartz due to dysphagia s/p ACDF (C3-6) surgery 12/19/2021. Pt had FEES on 12/26/21 with recommendation for "runny puree diet with thin liquids". He had MBSS 02/07/22 with recommendation for "soft diet with thin liquids" and again 06/20/2022 with recommendation for "soft diet and thin liquids".   PAIN:  Are you having pain? No   OBJECTIVE:  ASSESSMENT:   CLINICAL IMPRESSION: (From evaluation 07/05/22) Patient is a 57 y.o. male who was seen today for clinical swallow evaluation and treatment due to post op (ACDF C3-6, 12/19/2021) dysphagia. Pt last had MBSS on 06/19/2022 (see above) with recommendation for soft diet and thin liquids with the following precautions: sit upright, slow rate, take small bites/sips,  liquid wash for puree/grounds/solids, cough/clear throat regularly during meals (particularly with liquids), utilize multiple swallows per bite/sip. Dysphagia therapy was recommended to address deficits (reduced airway closure, vallecular residue, decreased epiglottic deflection, and reduced UES distention). Pt reports that he has been trying to eat "whatever" he can and just cuts "challenging" solids into small bites. He reports ongoing pharyngeal globus sensation with need for  repeat swallows and liquid wash. Will plan to see Pt for dysphagia therapy. Pt's weight is reportedly "steady" at this time.      OBJECTIVE IMPAIRMENTS: include dysphagia. These impairments are limiting patient from safety when swallowing. Factors affecting potential to achieve goals and functional outcome are severity of impairments and Time post onset . Patient will benefit from skilled SLP services to address above impairments and improve overall function.   REHAB POTENTIAL: Good     GOALS: Goals reviewed with patient? Yes   SHORT TERM GOALS: Target date: 10/11/2022   Pt will consume least restrictive diet of self regulated regular textures and thin liquids with use of compensatory strategies with indirect cues. Baseline: Pt consuming soft textures, some regular Goal status: ONGOING   2.  Pt will complete pharyngeal swallowing exercises including but not limited to: masako, effortful swallow, Mendelsohn, lingual press, chin tuck against resistance, and laryngeal closure with initial model provided by SLP and daily completion of 3x per day per Pt self-report. Baseline: completing 2-3x/day Goal status: ONGOING   3.  Complete MBSS in ~8+ weeks if desired by Pt and deemed necessary by SLP/MD Baseline: Last completed on 06/20/22 Goal status: Plan for mid to end of May 2024   ENT consult 08/07/2022: <<Findings: The nasal cavity and nasopharynx are unremarkable. The tongue base, pharyngeal walls, piriform sinuses, vallecula, epiglottis and postcricoid region are normal in appearance. The visualized portion of the subglottis and proximal trachea is widely patent. The vocal folds are mobile bilaterally with good glottal closure. There are no lesions on the free edge of the vocal folds nor elsewhere in the larynx worrisome for malignancy.   Impression: Excellent bilateral vocal fold motion. No gross anatomic abnormality to explain ongoing dysphagia. Electronically signed by: Noralee Space,  MD 08/07/2022>>  Appointments with Adora Fridge, SLP on 08/15/22 and 08/30/22 <<Today patient reports that he is unable to swallow. He has to continually cough things up when he is eating. Endorses choking, but denies wrong pipe sensation. Is crushing medications. He did attend ~3 sessions of swallowing therapy locally. Is currently working on Visual merchandiser for ~6 min total once daily.  Completed bioFEESback with sausage biscuit that patient brought from home. Practiced strategies of multiple swallows, liquid wash, and suppressing "hock" behavior. Patient required maximal cues for this. Limited scope tolerance so biofeedback was brief--did observe moderate-severe residue with this texture, though airway closure was intact. Following scope removal, reviewed images extensively with patient, and worked to differentiate between sticking sensation vs airway invasion. Also practiced oral intake without scope--overall swallowing behaviors and hocking did improve with continued maximal cues and reassurance.  Patient participated in the following dysphagia therapy exercises:  Effortful swallow: 25 (HEP at 20) Mendelsohn: 20 accurate repetitions, ~15 for training; patient benefited from maximal cues and mirror biofeedback  Exercises were completed at high intensity to patient-reported fatigue.>>  Previous treatment 09/05/2022:  Pt saw ENT (Dr. Fredirick Maudlin) on 08/06/22 (see details above) and then saw Berneta Sages, SLP for two visits. Pt shared video imaging of his MBSS in August and February with me and the  last MBSS shows significant improvement and reduced posterior pharyngeal wall edema and reduced backflow into nasopharynx. Endoscopy completed by Dr. Berenda Morale and Berneta Sages reveals adequate airway protection and laryngeal invasion of bolus which is reassuring. Pt did present with pharyngeal residuals and Pt needed cues to inhibit "hock" and coughing and follow with only small sip of liquid as needed. Pt  is eating a wide variety of foods, liquids, textures, and consistencies, however he continues to report distress in regards to globus sensation. SLP provided ongoing education regarding possible "over reaction" to residuals and attempts to suppress coughing. Functionally, Pt appears to have mild to mild/mod pharyngeal dysphagia (cannot be certain without imaging), however he reports subjectively moderate/severe distress/discomfort. Pt has maintained weight of 154 pounds. Pt completed swallowing exercises in session with min cues from SLP with a focus on Mendelsohn, effortful swallow, and chin tuck against resistance with swallow (continue at home 3x/day). He consumed vanilla pudding, graham crackers, and sips of water with min cues for cough suppression (prn). Pt scheduled for 2x/week for 3 weeks and then plan to complete MBSS here at the end of May. He continues to make good progress, although he subjectively continues to feel discomfort/distress with certain solids (mostly meats).   MBSS 10/15/2022: Clinical Impression: Pt presents with mild oral phase and mi/mod pharyngeal phase dysphagia characterized by impaired lingual movement and mastication with slow bolus movement and piecemeal deglutition with oral residuals necessitating repeat swallows to clear; pharyngeal phase is marked by swallow trigger before the valleculae, reduced hyolaryngeal excursion, tongue base retraction, epiglottic deflection, and posterior pharyngeal wall pressure (diminished pharyngeal stripping wave) resulting in glash penetration of thins after the swallow (from residuals in valleculae) and moderate vallecular residue with puree and solids. Pt is able to clear vallecular residue with repeat/dry swallows (up to 4 swallows to clear) and tends to bring some solids back up to oropharynx from the valleculae in order to repeat swallow (he did this with steak pieces). Pt with slightly improved vallecular clearance when he turns his head to  the RIGHT when he swallows solid foods. [facility] view completed and shows that the bolus predominantly traverses down the left side of pharynx. The imaging from this study was reviewed with Pt and compared to some images from his MBSS in February that he had on his phone. Slight improvement noted in epiglottic deflection and vallecular clearance, however these deficits persist. Pt is able to eat and drink most foods, but needs to modify by cutting meats into very small pieces and add moisture. He does not appear to be at great risk for aspiration and protects his airway well. Recommend that Pt continue to eat a variety of textures and continue with swallowing exercises in the hopes of gradual improvement in pharyngeal clearance (swallow efficiency) with more time. Pt expresses frustration regarding his dysphagia and SLP continued to encourage him. He was able to swallow the barium tablet with water without incident. SLP also suggested that Pt try head turn to the RIGHT with more challenging solids (chew meats well) and swallow. Plan to have Pt return for follow up in my office in 2-3 weeks. His recovery will continue to take time, but he has shown improvement.   TODAY'S TREATMENT:  Pt reports ongoing frustration regarding his swallow function and efficiency. SLP reviewed imaging from MBSS with Pt and recommendations. He reports that he often "gives up" eating because it is too challenging to have to swallow so many times to get food to go down.  Pt generally has to swallow ~3x for each bite of solid food to completely clear the valleculae. SLP explained that there is no procedure to improve this (he asked about nerve conduction testing) and that he should continue with the exercises, consume a variety of textures (stick to softer solids if he's frustrated), and give it more time. He is hoping to have his teeth extracted so that he can get dentures. He primarily masticates with dentition toward the front of his mouth as  he is missing upper molars. For this reason, he is also encouraged to finely chop his meats and add moisture. No further SLP services indicated at this time, however Pt may wish to pursue repeat MBSS in ~6 months to monitor for changes. Recommend self regulated regular textures (chop meats and add moisture) and thin liquids with repeat/dry swallows and head turn to the RIGHT for challenging solids.                                                                                                                                         DATE: 11/21/22   PLAN:   Discharge from therapy    Thank you,  Havery Moros, CCC-SLP [phone]  Shamarra Warda, CCC-SLP 11/21/2022, 12:01 PM

## 2022-11-26 ENCOUNTER — Ambulatory Visit (HOSPITAL_COMMUNITY): Payer: Medicaid Other | Admitting: Speech Pathology

## 2022-11-28 ENCOUNTER — Ambulatory Visit (HOSPITAL_COMMUNITY): Payer: Medicaid Other | Admitting: Speech Pathology

## 2022-12-03 ENCOUNTER — Ambulatory Visit (HOSPITAL_COMMUNITY): Payer: Medicaid Other | Admitting: Speech Pathology

## 2022-12-05 ENCOUNTER — Ambulatory Visit (HOSPITAL_COMMUNITY): Payer: Medicaid Other | Admitting: Speech Pathology

## 2022-12-10 ENCOUNTER — Ambulatory Visit (HOSPITAL_COMMUNITY): Payer: Medicaid Other | Admitting: Speech Pathology

## 2022-12-12 ENCOUNTER — Ambulatory Visit (HOSPITAL_COMMUNITY): Payer: Medicaid Other | Admitting: Speech Pathology

## 2022-12-27 ENCOUNTER — Ambulatory Visit (INDEPENDENT_AMBULATORY_CARE_PROVIDER_SITE_OTHER): Payer: Medicaid Other | Admitting: Podiatry

## 2022-12-27 ENCOUNTER — Encounter: Payer: Self-pay | Admitting: Podiatry

## 2022-12-27 ENCOUNTER — Ambulatory Visit (INDEPENDENT_AMBULATORY_CARE_PROVIDER_SITE_OTHER): Payer: Medicaid Other

## 2022-12-27 DIAGNOSIS — B351 Tinea unguium: Secondary | ICD-10-CM | POA: Diagnosis not present

## 2022-12-27 DIAGNOSIS — M7661 Achilles tendinitis, right leg: Secondary | ICD-10-CM | POA: Diagnosis not present

## 2022-12-27 DIAGNOSIS — M778 Other enthesopathies, not elsewhere classified: Secondary | ICD-10-CM

## 2022-12-27 DIAGNOSIS — G540 Brachial plexus disorders: Secondary | ICD-10-CM | POA: Insufficient documentation

## 2022-12-27 NOTE — Progress Notes (Signed)
Subjective:   Patient ID: Kenneth Gilbert, male   DOB: 57 y.o.   MRN: [id number]   HPI Patient presents with history of severe nails that are hard for him to take care of and get thick history of pain in his right foot and feels like his lower legs are cold and dry with irritation.  Patient no longer smokes and tries to be active   Review of Systems  All other systems reviewed and are negative.       Objective:  Physical Exam Vitals and nursing note reviewed.  Constitutional:      Appearance: He is well-developed.  Pulmonary:     Effort: Pulmonary effort is normal.  Musculoskeletal:        General: Normal range of motion.  Skin:    General: Skin is warm.  Neurological:     Mental Status: He is alert.     Vascular status slightly diminished but intact neurological mildly diminished.  Patient is relatively malnourished does have severely elongated nailbeds 1-5 both feet that are thick and he gets irritation between his digits of both feet.  He does have forefoot discomfort bilateral nowhere specific     Assessment:  Chronic nail disease chronic poor health with diminishment of sharp dull vibratory and moderate malnourishment     Plan:  H&P discussed treatment options.  I would rather try to be easy with him and I advised him on shoe gear choices good socks I did courtesy debridement of nailbeds 1-5 both feet and I advised on elevation on an as-needed basis.  If any breaks in skin or other pathology were to occur he is to reappoint immediately if not I do not see any other ways I can help him

## 2023-01-08 ENCOUNTER — Ambulatory Visit
Admission: EM | Admit: 2023-01-08 | Discharge: 2023-01-08 | Disposition: A | Discharge status: Home / Self Care | Payer: Medicaid Other | Attending: Nurse Practitioner | Admitting: Nurse Practitioner

## 2023-01-08 ENCOUNTER — Ambulatory Visit: Payer: Medicaid Other

## 2023-01-08 DIAGNOSIS — M25421 Effusion, right elbow: Secondary | ICD-10-CM

## 2023-01-08 DIAGNOSIS — M25521 Pain in right elbow: Secondary | ICD-10-CM

## 2023-01-08 MED ORDER — PREDNISONE 20 MG PO TABS: 40.0000 mg | ORAL_TABLET | Freq: Every day | ORAL | 0 refills | Status: AC

## 2023-01-08 NOTE — ED Provider Notes (Signed)
[facility]    CSN: [id number] Arrival date & time: 01/08/23  4098      History   Chief Complaint Chief Complaint  Patient presents with   Arm Swelling    HPI Kenneth Gilbert is a 57 y.o. male.   The history is provided by the patient.   The patient presents for complaints of swelling to the right upper extremity that started over the past 2 days.  Patient states that he normally has baseline swelling at the right elbow after multiple surgeries several years ago.  He states swelling has been present since 1997.  He states over the past 2 days, the swelling appears to have "moved up".  He states the area is red and tender to touch.  He states he is concerned that he may have a "blood clot" in his arm.  Patient reports he is right-hand dominant.  He denies injury or trauma, fever, chills, shortness of breath, difficulty breathing, decreased range of motion from baseline, or bruising to the site.    Past Medical History:  Diagnosis Date   Arthritis    Dizziness    ETOH abuse    Stopped drinking 4 yrs   Hard of hearing    Headache    Hyperlipidemia     Patient Active Problem List   Diagnosis Date Noted   Stenosis of left iliac artery (HCC) 12/14/2021   Abdominal bruit 12/07/2021   Therapeutic opioid-induced constipation (OIC) 12/07/2021   Cervical spinal stenosis 06/22/2021   Ureteral calculus, right 03/21/2021   Benign prostatic hyperplasia with urinary hesitancy 03/07/2021   LVH (left ventricular hypertrophy) 11/03/2019   Diastolic dysfunction without heart failure 11/03/2019   Neuromyelopathy due to vitamin B12 deficiency (HCC) 07/17/2018   Other chronic sinusitis 06/11/2018   Vertebral artery stenosis 12/11/2017   Sensorineural hearing loss (SNHL), bilateral 12/03/2017   Aortic atherosclerosis (HCC) 07/26/2017   Aortic ectasia, abdominal (HCC) 07/26/2017   Chronic vascular disorder of intestine (HCC) 06/12/2017   Hyperlipidemia with target LDL less than  100 06/12/2017   Tobacco abuse 06/11/2017   Nephrolithiasis 02/20/2013   GERD 07/12/2009    Past Surgical History:  Procedure Laterality Date   arm surgery Right    arm surgery Left    COLONOSCOPY     EXTRACORPOREAL SHOCK WAVE LITHOTRIPSY Right 03/28/2021   Procedure: EXTRACORPOREAL SHOCK WAVE LITHOTRIPSY (ESWL);  Surgeon: Malen Gauze, MD;  Location: [facility];  Service: Urology;  Laterality: Right;   EXTRACORPOREAL SHOCK WAVE LITHOTRIPSY Left 07/11/2021   Procedure: EXTRACORPOREAL SHOCK WAVE LITHOTRIPSY (ESWL);  Surgeon: Milderd Meager., MD;  Location: [facility];  Service: Urology;  Laterality: Left;   INNER EAR SURGERY     as a child   SHOULDER SURGERY Left        Home Medications    Prior to Admission medications   Medication Sig Start Date End Date Taking? Authorizing Provider  predniSONE (DELTASONE) 20 MG tablet Take 2 tablets (40 mg total) by mouth daily with breakfast for 5 days. 01/08/23 01/13/23 Yes Erik Nessel-Warren, Sadie Haber, NP  PRESCRIPTION MEDICATION Patient states he does take medications but doesn't know the names    [provider]    Family History Family History  Problem Relation Age of Onset   Heart disease Mother    Cancer Father        unknown type   Cirrhosis Sister    Cancer Brother    Colon cancer Neg Hx    Colon polyps  Neg Hx    Esophageal cancer Neg Hx    Rectal cancer Neg Hx    Stomach cancer Neg Hx     Social History Social History   Tobacco Use   Smoking status: Former    Current packs/day: 1.00    Average packs/day: 1 pack/day for 44.6 years (44.6 ttl pk-yrs)    Types: Cigarettes    Start date: 06/11/1978   Smokeless tobacco: Never  Vaping Use   Vaping status: Never Used  Substance Use Topics   Alcohol use: No    Comment: 01/09/18 - reports no use in 3 years   Drug use: Yes    Types: Marijuana    Comment: occa     Allergies   Codeine, Gabapentin, and Topamax [topiramate]   Review of Systems Review of  Systems Per HPI  Physical Exam Triage Vital Signs ED Triage Vitals  Encounter Vitals Group     BP 01/08/23 0956 (!) 94/59     Systolic BP Percentile --      Diastolic BP Percentile --      Pulse Rate 01/08/23 0956 71     Resp 01/08/23 0956 16     Temp 01/08/23 0956 98.2 F (36.8 C)     Temp Source 01/08/23 0956 Oral     SpO2 01/08/23 0956 98 %     Weight --      Height --      Head Circumference --      Peak Flow --      Pain Score 01/08/23 0957 9     Pain Loc --      Pain Education --      Exclude from Growth Chart --    No data found.  Updated Vital Signs BP (!) 94/59 (BP Location: Left Arm)   Pulse 71   Temp 98.2 F (36.8 C) (Oral)   Resp 16   SpO2 98%   Visual Acuity Right Eye Distance:   Left Eye Distance:   Bilateral Distance:    Right Eye Near:   Left Eye Near:    Bilateral Near:     Physical Exam Vitals and nursing note reviewed.  Constitutional:      General: He is not in acute distress.    Appearance: Normal appearance.  HENT:     Head: Normocephalic.  Eyes:     Extraocular Movements: Extraocular movements intact.     Pupils: Pupils are equal, round, and reactive to light.  Cardiovascular:     Rate and Rhythm: Normal rate and regular rhythm.     Pulses: Normal pulses.     Heart sounds: Normal heart sounds.  Pulmonary:     Effort: Pulmonary effort is normal.     Breath sounds: Normal breath sounds.  Musculoskeletal:     Right elbow: Swelling present. No deformity. Tenderness present.       Arms:     Cervical back: Normal range of motion.  Lymphadenopathy:     Cervical: No cervical adenopathy.  Neurological:     General: No focal deficit present.     Mental Status: He is alert and oriented to person, place, and time.  Psychiatric:        Mood and Affect: Mood normal.        Behavior: Behavior normal.      UC Treatments / Results  Labs (all labs ordered are listed, but only abnormal results are displayed) Labs Reviewed - No data  to display  EKG  Radiology DG Elbow Complete Right  Result Date: 01/08/2023 CLINICAL DATA:  Medial elbow swelling for 2 days.  No known injury. EXAM: RIGHT ELBOW - COMPLETE 3+ VIEW COMPARISON:  None Available. FINDINGS: The mineralization and alignment are normal. There is no evidence of acute fracture or dislocation. There is fragmented spurring of the olecranon process and medial humeral epicondyle. There is a probable nonspecific elbow joint effusion with possible soft tissue swelling posteriorly and medially. No foreign body or soft tissue emphysema identified. IMPRESSION: 1. No acute osseous findings. 2. Nonspecific elbow joint effusion and possible soft tissue swelling. 3. Fragmented spurring of the olecranon process and medial humeral epicondyle. Electronically Signed   By: Carey Bullocks M.D.   On: 01/08/2023 11:42    Procedures Procedures (including critical care time)  Medications Ordered in UC Medications - No data to display  Initial Impression / Assessment and Plan / UC Course  I have reviewed the triage vital signs and the nursing notes.  Pertinent labs & imaging results that were available during my care of the patient were reviewed by me and considered in my medical decision making (see chart for details).  The patient is well-appearing, he is in no acute distress, vital signs are stable.  X-ray of the right elbow is negative for acute osseous findings, it does show an effusion, soft tissue swelling, and fragmented spurring of the olecranon process.  Patient does have a history of olecranon bursitis.  Will treat patient symptomatically with prednisone 40 mg to help with inflammation and swelling.  Supportive care recommendations were provided and discussed with the patient to include RICE therapy, over-the-counter analgesics, and the use of ice.  Patient was advised to follow-up with orthopedics if symptoms fail to improve.  Patient is in agreement with this plan of care and  verbalizes understanding.  All questions were answered.  Patient stable for discharge.  Final Clinical Impressions(s) / UC Diagnoses   Final diagnoses:  Pain and swelling of right elbow     Discharge Instructions      The x-ray does not show concern for a blood clot.  The x-ray does show fluid on the elbow along with swelling in the surrounding tissue.   Take medication as prescribed. Apply ice to the affected area to help with pain and swelling.  Apply for 20 minutes, remove for 1 hour, repeat as needed. RICE therapy, rest, ice, compression, and elevation. If symptoms are not improving, or if they are worsening, it is recommended that you follow-up with orthopedics for further evaluation.  It is recommended to follow-up with the orthopedics that performed your previous surgeries.  You can also follow-up with [facility] or with [facility]. Follow-up as needed.      ED Prescriptions     Medication Sig Dispense Auth. Provider   predniSONE (DELTASONE) 20 MG tablet Take 2 tablets (40 mg total) by mouth daily with breakfast for 5 days. 10 tablet Kenneth Gilbert, Sadie Haber, NP      PDMP not reviewed this encounter.   Abran Cantor, NP 01/08/23 1200

## 2023-01-08 NOTE — ED Triage Notes (Signed)
Pt states he woke up 2 days ago and his right elbow was swollen and painful.

## 2023-01-08 NOTE — Discharge Instructions (Addendum)
The x-ray does not show concern for a blood clot.  The x-ray does show fluid on the elbow along with swelling in the surrounding tissue.   Take medication as prescribed. Apply ice to the affected area to help with pain and swelling.  Apply for 20 minutes, remove for 1 hour, repeat as needed. RICE therapy, rest, ice, compression, and elevation. If symptoms are not improving, or if they are worsening, it is recommended that you follow-up with orthopedics for further evaluation.  It is recommended to follow-up with the orthopedics that performed your previous surgeries.  You can also follow-up with [facility] or with [facility]. Follow-up as needed.

## 2023-04-05 DIAGNOSIS — G5623 Lesion of ulnar nerve, bilateral upper limbs: Secondary | ICD-10-CM | POA: Insufficient documentation

## 2023-04-05 DIAGNOSIS — M25729 Osteophyte, unspecified elbow: Secondary | ICD-10-CM | POA: Insufficient documentation

## 2023-05-22 ENCOUNTER — Encounter: Payer: Self-pay | Admitting: Acute Care

## 2023-06-17 ENCOUNTER — Encounter: Payer: Self-pay | Admitting: Internal Medicine

## 2023-06-24 ENCOUNTER — Telehealth: Payer: Self-pay | Admitting: *Deleted

## 2023-06-24 NOTE — Telephone Encounter (Signed)
 Team,  This pt is schedule to be evaluated on 07/02/23.  He has had a ACDF C3-C6.  It is important to assess his neck mobility both vertically by having him touch his chin to chest and move it towards the ceiling; as well as horizontally, side to side.  If he is limited in this movements his procedure will need to be done at the hospital.  Thanks,  Rogena Class

## 2023-06-25 NOTE — Telephone Encounter (Signed)
Printed and attached to pre visit chart for evaluation during his pre visit. If he is limited in his movements, patient will be rescheduled at the hospital.

## 2023-07-02 ENCOUNTER — Ambulatory Visit (AMBULATORY_SURGERY_CENTER): Payer: Medicare Other

## 2023-07-02 VITALS — Ht 74.0 in | Wt 145.0 lb

## 2023-07-02 DIAGNOSIS — D120 Benign neoplasm of cecum: Secondary | ICD-10-CM

## 2023-07-02 DIAGNOSIS — Z860100 Personal history of colon polyps, unspecified: Secondary | ICD-10-CM

## 2023-07-02 MED ORDER — SUFLAVE 178.7 G PO SOLR: 1.0000 | Freq: Once | ORAL | 0 refills | Status: AC

## 2023-07-02 NOTE — Progress Notes (Signed)
No egg or soy allergy known to patient  No issues known to pt with past sedation with any surgeries or procedures Patient denies ever being told they had issues or difficulty with intubation  No FH of Malignant Hyperthermia Pt is not on diet pills Pt is not on  home 02  Pt is not on blood thinners  Pt denies issues with constipation  No A fib or A flutter Have any cardiac testing pending--no Pt can ambulate with cane Pt denies use of chewing tobacco Discussed diabetic I weight loss medication holds Discussed NSAID holds Checked BMI Pt instructed to use [employer] or GoodRx for a price reduction on prep   Patient's chart will be reviewed by Cathlyn Parsons CNRA prior to previsit  Patient states that he cannot touch chin to chest with the plate in his neck from ACDF C/3-C6. He can move head towards the ceiling and from side to side.  Message sent to Cathlyn Parsons to advise to proceed at [facility], or move to the hospital.

## 2023-07-03 ENCOUNTER — Ambulatory Visit (INDEPENDENT_AMBULATORY_CARE_PROVIDER_SITE_OTHER): Payer: Medicare Other | Admitting: Podiatry

## 2023-07-03 ENCOUNTER — Encounter: Payer: Self-pay | Admitting: Podiatry

## 2023-07-03 DIAGNOSIS — B351 Tinea unguium: Secondary | ICD-10-CM

## 2023-07-03 DIAGNOSIS — B353 Tinea pedis: Secondary | ICD-10-CM | POA: Diagnosis not present

## 2023-07-03 MED ORDER — TERBINAFINE HCL 250 MG PO TABS: 250.0000 mg | ORAL_TABLET | Freq: Every day | ORAL | 0 refills | Status: DC

## 2023-07-03 MED ORDER — CLOTRIMAZOLE-BETAMETHASONE 1-0.05 % EX CREA: 1.0000 | TOPICAL_CREAM | Freq: Every day | CUTANEOUS | 3 refills | Status: DC

## 2023-07-03 NOTE — Progress Notes (Signed)
Chief Complaint  Patient presents with   Nail Problem    Toenails - thick and discolored x years, seen Dr. Charlsie Merles last year and was never treated, would like re-evaluation and treatment, also skin is very scaly   Toe Pain    4th toe left - burning, stabbing pain x several years, but worsened over the past few months, wakes him up in the middle of the night  (Patient declined xrays for left foot, said would like to focus on nails first)    Subjective: 58 y.o. male presenting today for evaluation of thick discoloration to the toenails bilateral.  He was last seen here in the office by another physician he was not satisfied with his care.  He would like to treat the nails and correct for the fungal nail infections that he is experiencing.  He also has itching and burning sensation to the toes bilateral  Past Medical History:  Diagnosis Date   Allergy    Arthritis    Dizziness    ETOH abuse    Stopped drinking 4 yrs   Hard of hearing    Headache    Hyperlipidemia     Past Surgical History:  Procedure Laterality Date   arm surgery Right    arm surgery Left    COLONOSCOPY     EXTRACORPOREAL SHOCK WAVE LITHOTRIPSY Right 03/28/2021   Procedure: EXTRACORPOREAL SHOCK WAVE LITHOTRIPSY (ESWL);  Surgeon: Malen Gauze, MD;  Location: [facility];  Service: Urology;  Laterality: Right;   EXTRACORPOREAL SHOCK WAVE LITHOTRIPSY Left 07/11/2021   Procedure: EXTRACORPOREAL SHOCK WAVE LITHOTRIPSY (ESWL);  Surgeon: Milderd Meager., MD;  Location: [facility];  Service: Urology;  Laterality: Left;   INNER EAR SURGERY     as a child   SHOULDER SURGERY Left     Allergies  Allergen Reactions   Codeine     REACTION: Dizzy   Gabapentin Other (See Comments)    Burning sensation in stomach   Topamax [Topiramate] Other (See Comments)    Kidney stone/hematuria    B/L toes 07/03/2023  Objective: Physical Exam General: The patient is alert and oriented x3 in no acute distress.  Dermatology:  Hyperkeratotic, discolored, thickened, onychodystrophy noted. Skin is warm, dry and supple bilateral lower extremities. Negative for open lesions or macerations.  Xerosis of skin with itching and burning sensation noted  Vascular: Slightly delayed capillary refill.  Diminished pulses.  Will observe for now since currently he is not symptomatic  Neurological: Grossly intact via light touch  Musculoskeletal Exam: No pedal deformity noted  Assessment: #1 Onychomycosis of toenails bilateral #2 chronic tinea pedis bilateral  Plan of Care:  #1 Patient was evaluated. #2  Today we discussed different treatment options including oral, topical, and laser antifungal treatment modalities.  We discussed their efficacies and side effects.  Patient opts for oral antifungal treatment modality #3 prescription for Lamisil 250 mg #90 daily.  CMP 12/11/2022 AST and ALT levels slightly below normal range.  Not elevated #4  Prescription for Lotrisone cream apply twice daily #5 return to clinic 3 months   Felecia Shelling, DPM [facility]  Dr. Felecia Shelling, DPM    [address], [place] [address]  Office [phone]  Fax [phone]

## 2023-07-29 ENCOUNTER — Telehealth: Payer: Self-pay | Admitting: Internal Medicine

## 2023-07-29 NOTE — Telephone Encounter (Signed)
 Returned patient call.  Patient wanting to know about taking Meloxicam and hs is  also dealing with a cold. Advised patient to call at end of week to reschedule if he is still having cold symptoms.  Regarding Meloxicam, advised patient that he would need to hold it 7 days to reduce risks of re bleeding after polypectomy. Patient voiced understanding.

## 2023-07-29 NOTE — Telephone Encounter (Signed)
 Inbound call from patient stating he has been complications regarding prep medications. Did not disclose further information and would like to speak with nurse. Please advise, thank you.

## 2023-07-31 IMAGING — CT CT RENAL STONE PROTOCOL
2 of 4 series · 16 of 46 positions shown, 18 images · non-contrast
Comparison: 04/15/2020

CLINICAL DATA: Flank pain with kidney stone suspected

EXAM:
CT ABDOMEN AND PELVIS WITHOUT CONTRAST
TECHNIQUE: Multidetector CT imaging of the abdomen and pelvis was performed
following the standard protocol without IV contrast.

[Series 2: axial st · axial · 0.79mm/px · z∈[-477,-52]mm · 13 of 97 slices shown, 15 images]
[im 6/97  soft-tissue]
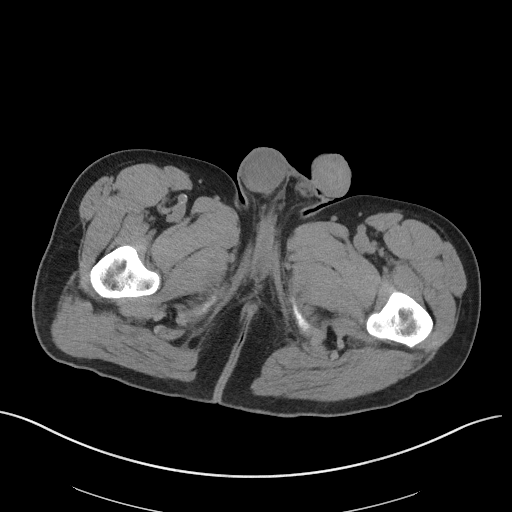
[im 6/97  bone]
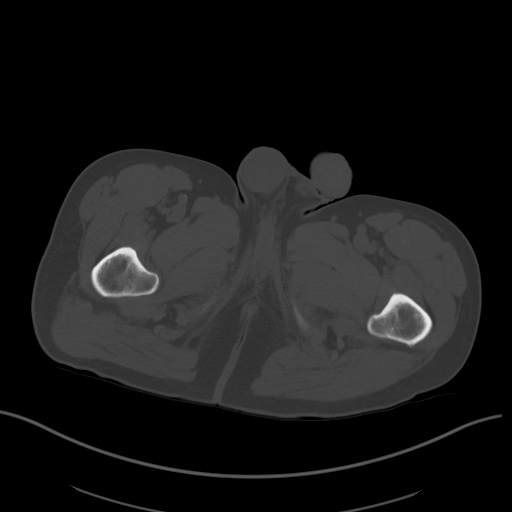
[im 12/97  soft-tissue]
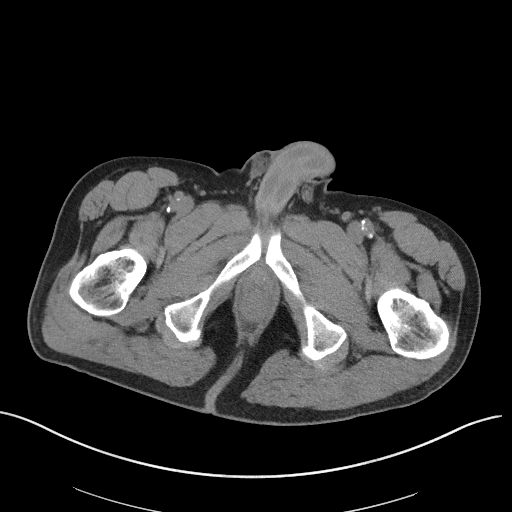
[im 23/97  soft-tissue]
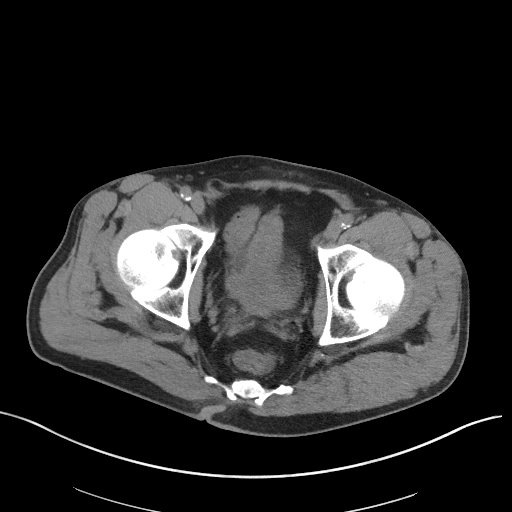
[im 29/97  soft-tissue]
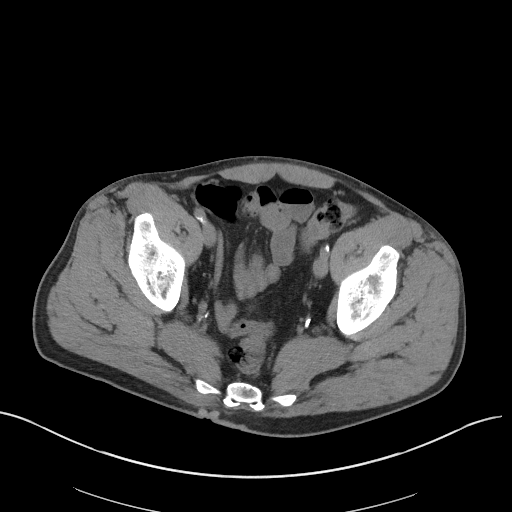
[im 34/97  soft-tissue]
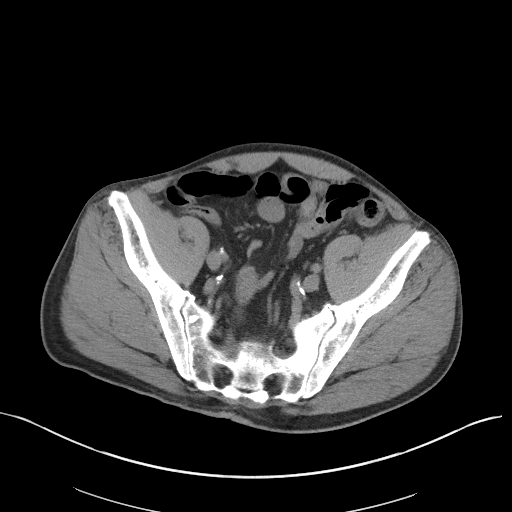
[im 40/97  soft-tissue]
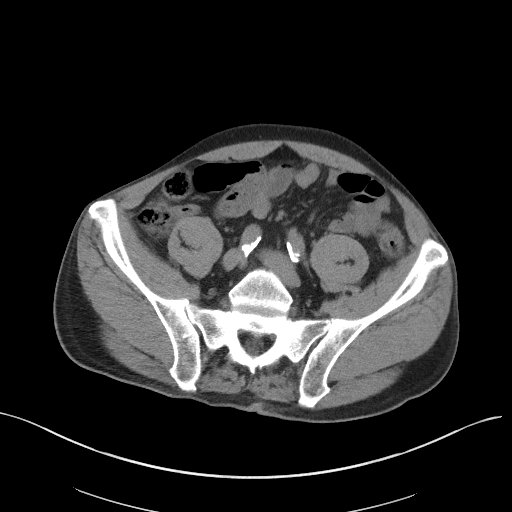
[im 51/97  soft-tissue]
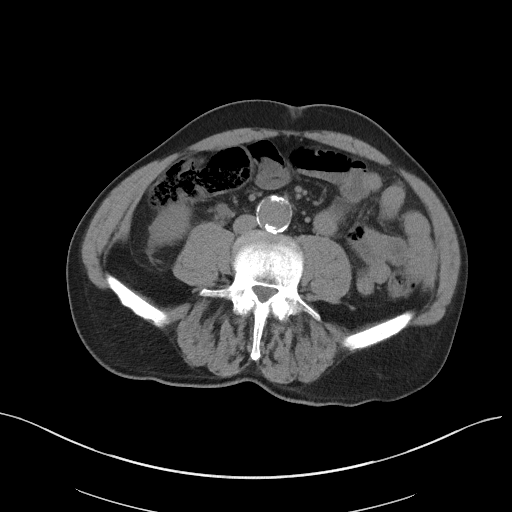
[im 57/97  soft-tissue]
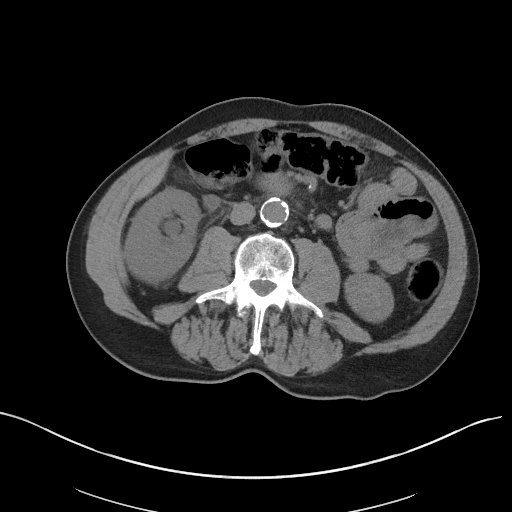
[im 63/97  soft-tissue]
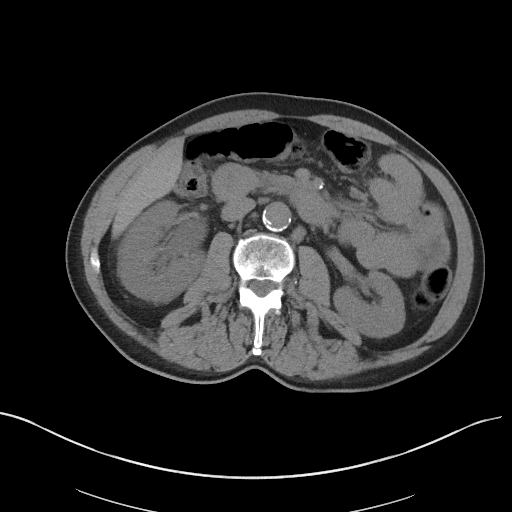
[im 63/97  bone]
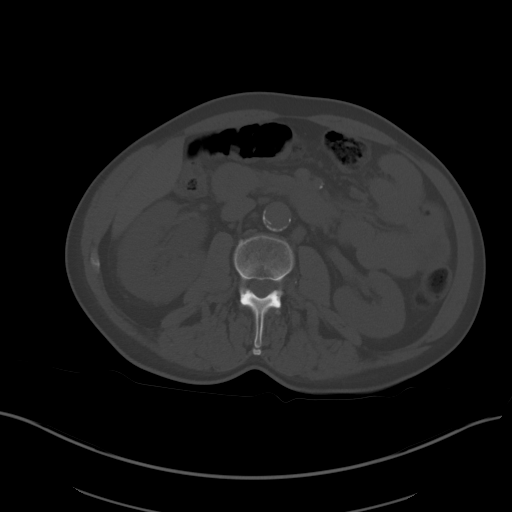
[im 68/97  soft-tissue]
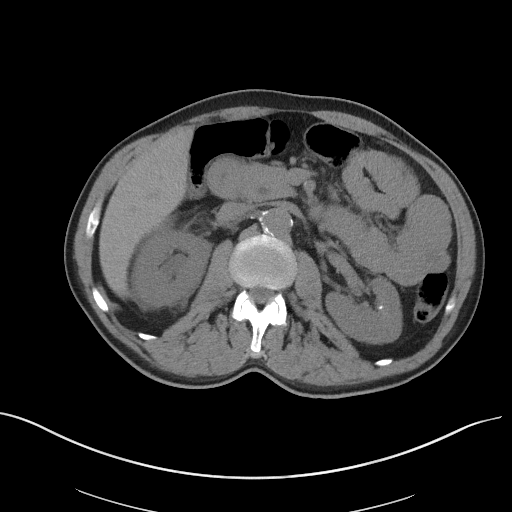
[im 74/97  soft-tissue]
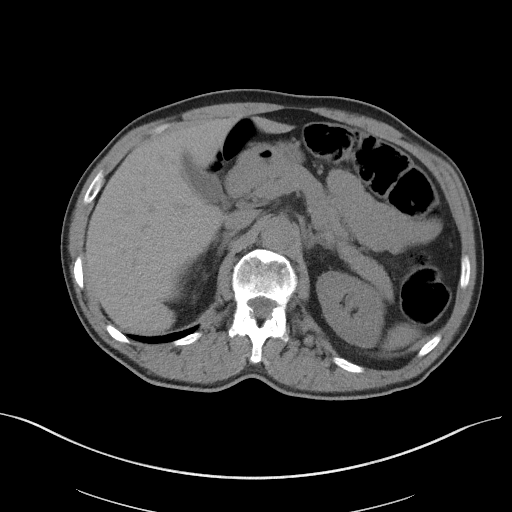
[im 85/97  soft-tissue]
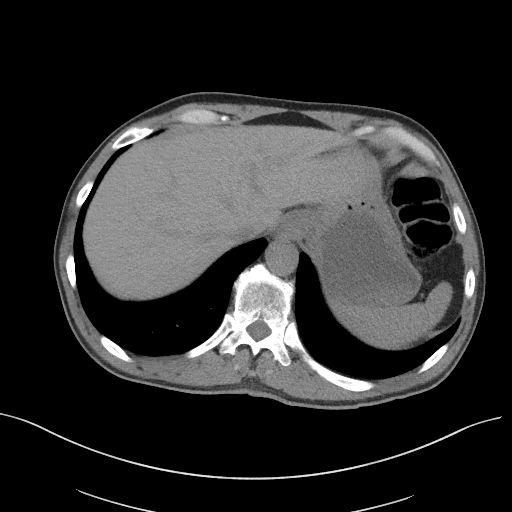
[im 91/97  soft-tissue]
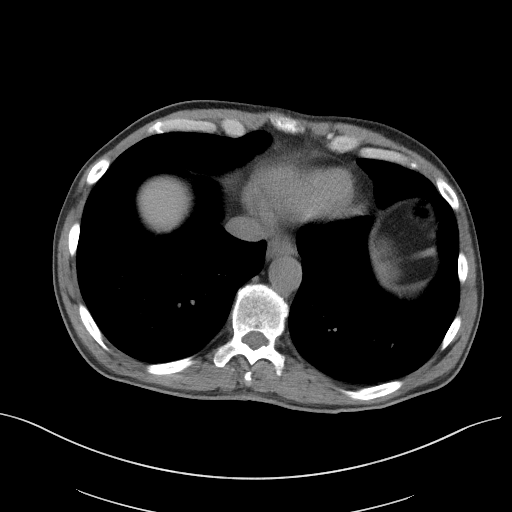

[Series 5: coronal st · coronal · 0.70mm/px · 3 of 92 slices shown]
[im 31/92  soft-tissue]
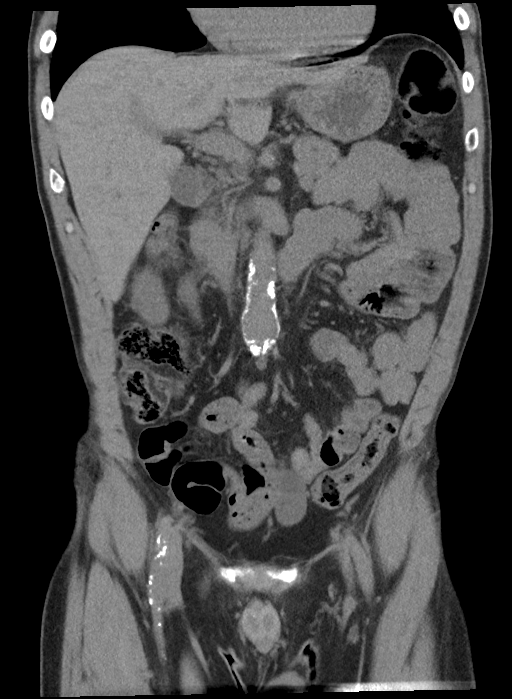
[im 41/92  soft-tissue]
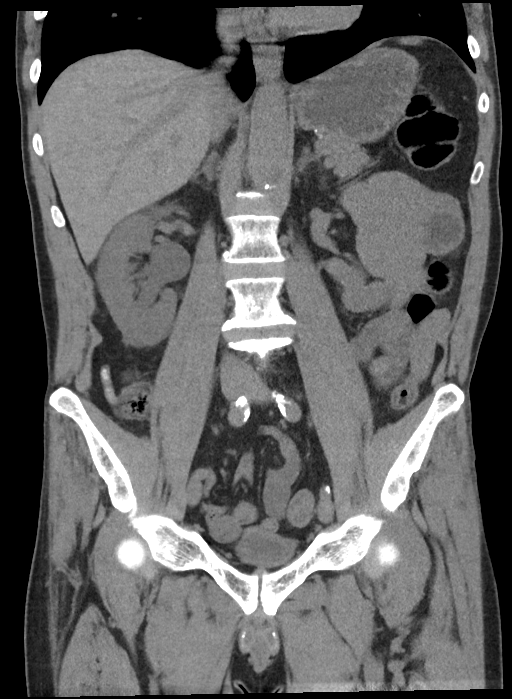
[im 51/92  soft-tissue]
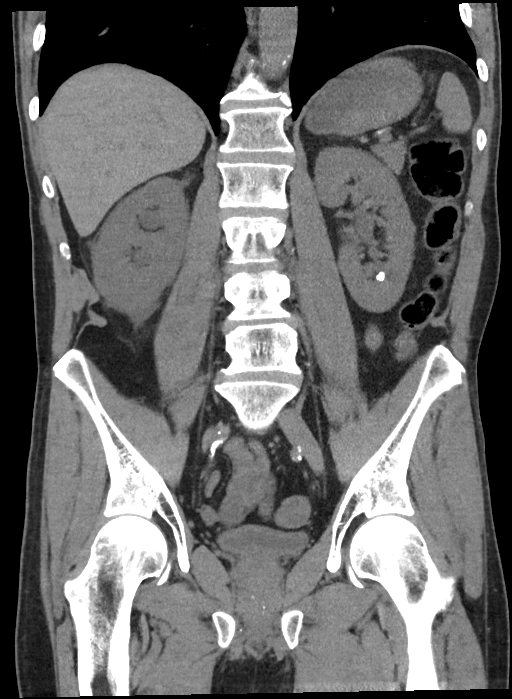

[16 of 46 positions shown; findings below may reference images not displayed]

FINDINGS: Lower chest:  No contributory findings.

Hepatobiliary: No focal liver abnormality.No evidence of biliary
obstruction or stone.

Pancreas: Unremarkable.

Spleen: Unremarkable.

Adrenals/Urinary Tract: Negative adrenals. Right
hydroureteronephrosis due to a ureteric stone measuring 6 mm at the
level of L4. At least 3 punctate right renal calculi. Two larger
left-sided renal calculi measuring up to 6 mm at the lower pole. No
left hydronephrosis. Unremarkable bladder.

Stomach/Bowel:  No obstruction. No visible bowel inflammation

Vascular/Lymphatic: No acute vascular abnormality. Atheromatous
calcification of the aorta and iliacs no mass or adenopathy.

Reproductive:No pathologic findings.

Other: No ascites or pneumoperitoneum.

Musculoskeletal: No acute abnormalities.
IMPRESSION: 1. Obstructing 6 mm stone in the right ureter at the level of L4.
2. Bilateral nephrolithiasis.
3.  Aortic Atherosclerosis ([id number]).

## 2023-08-01 NOTE — Telephone Encounter (Signed)
 Patient is returning a call back to Kenneth Gilbert. Please advise.

## 2023-08-01 NOTE — Telephone Encounter (Signed)
 Patient called and stated that he has still not gotten rid of his cold like symptoms. Patient is requesting a call back to see what can be done as far rescheduling if need be. Please advise.

## 2023-08-01 NOTE — Telephone Encounter (Signed)
 Pt wanted to reschedule procedure due to his cold. Pt scheduled for colon in the [facility] 09/18/23 at 1:30pm. Pt aware of appt.

## 2023-08-01 NOTE — Telephone Encounter (Signed)
 Left message for pt to call back

## 2023-08-05 ENCOUNTER — Encounter: Payer: Medicaid Other | Admitting: Internal Medicine

## 2023-08-23 IMAGING — DX DG ABDOMEN 1V
2 series · 2 of 2 positions shown · non-contrast
Comparison: 03/28/2021

CLINICAL DATA: History of right-sided lithotripsy

EXAM:
ABDOMEN - 1 VIEW

[abdomen kub (1 of 2)]
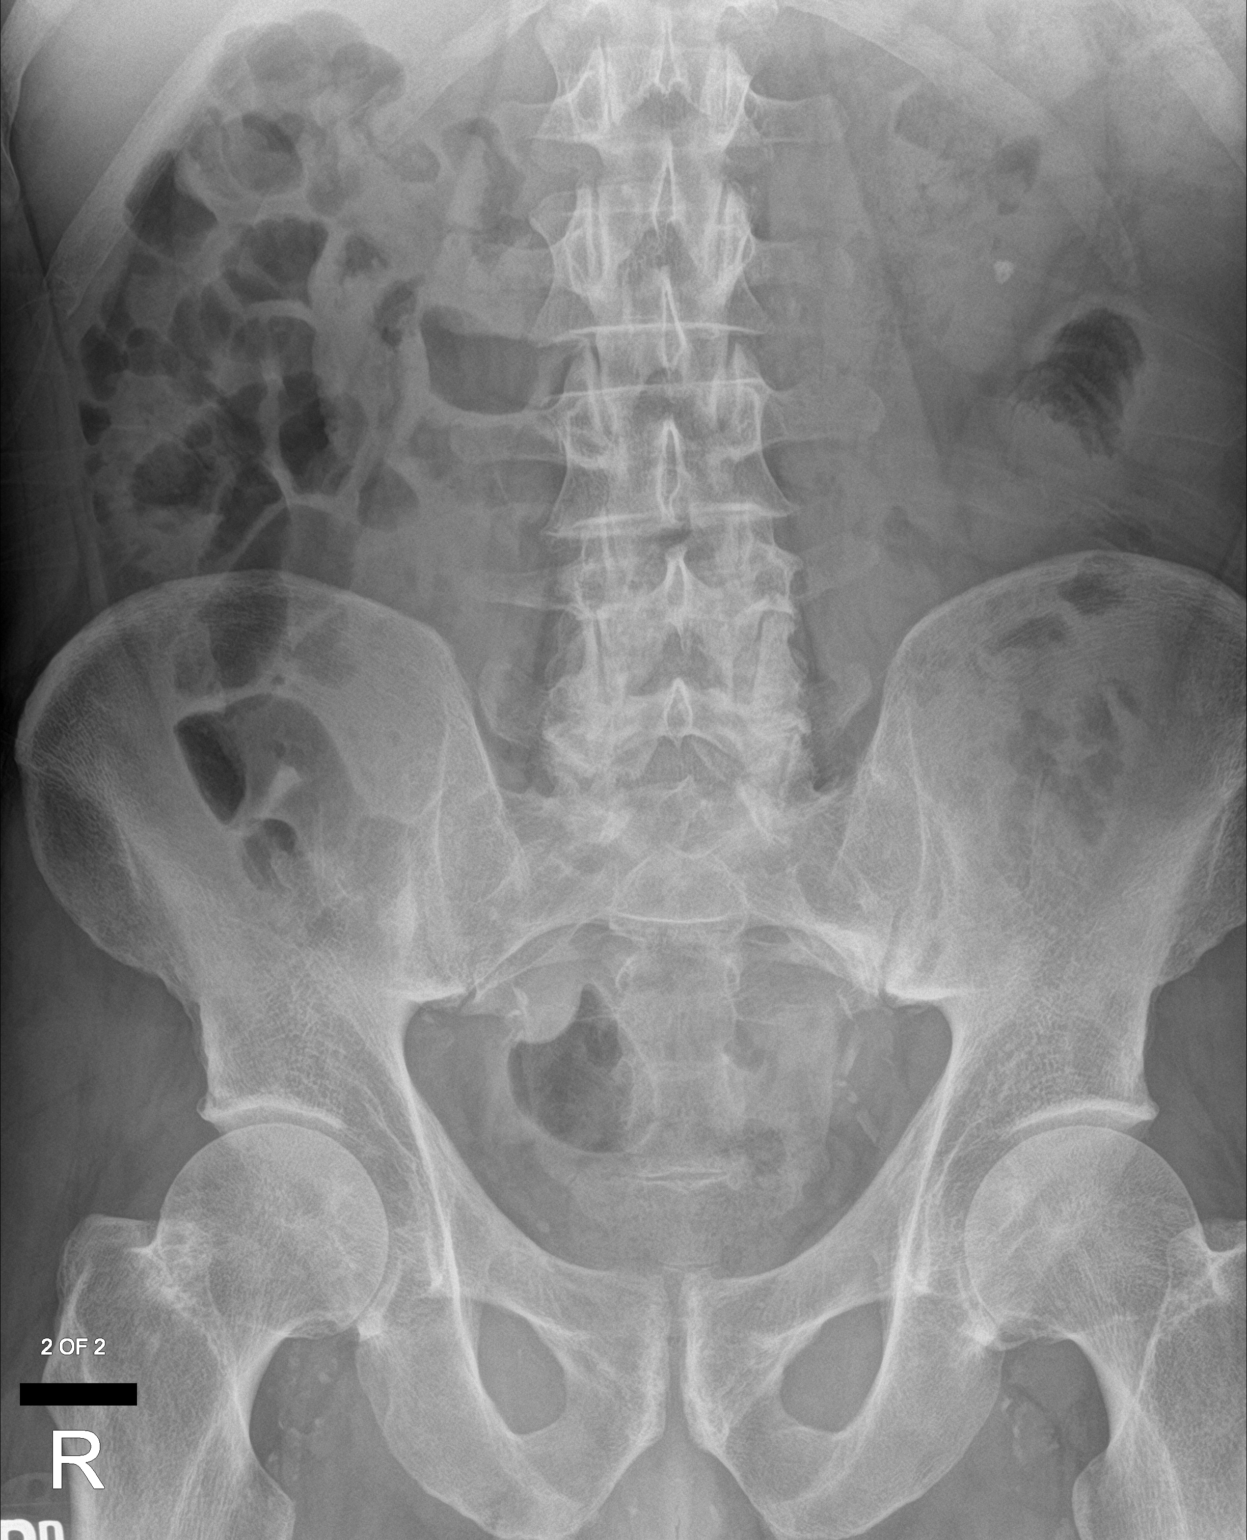

[abdomen kub (2 of 2)]
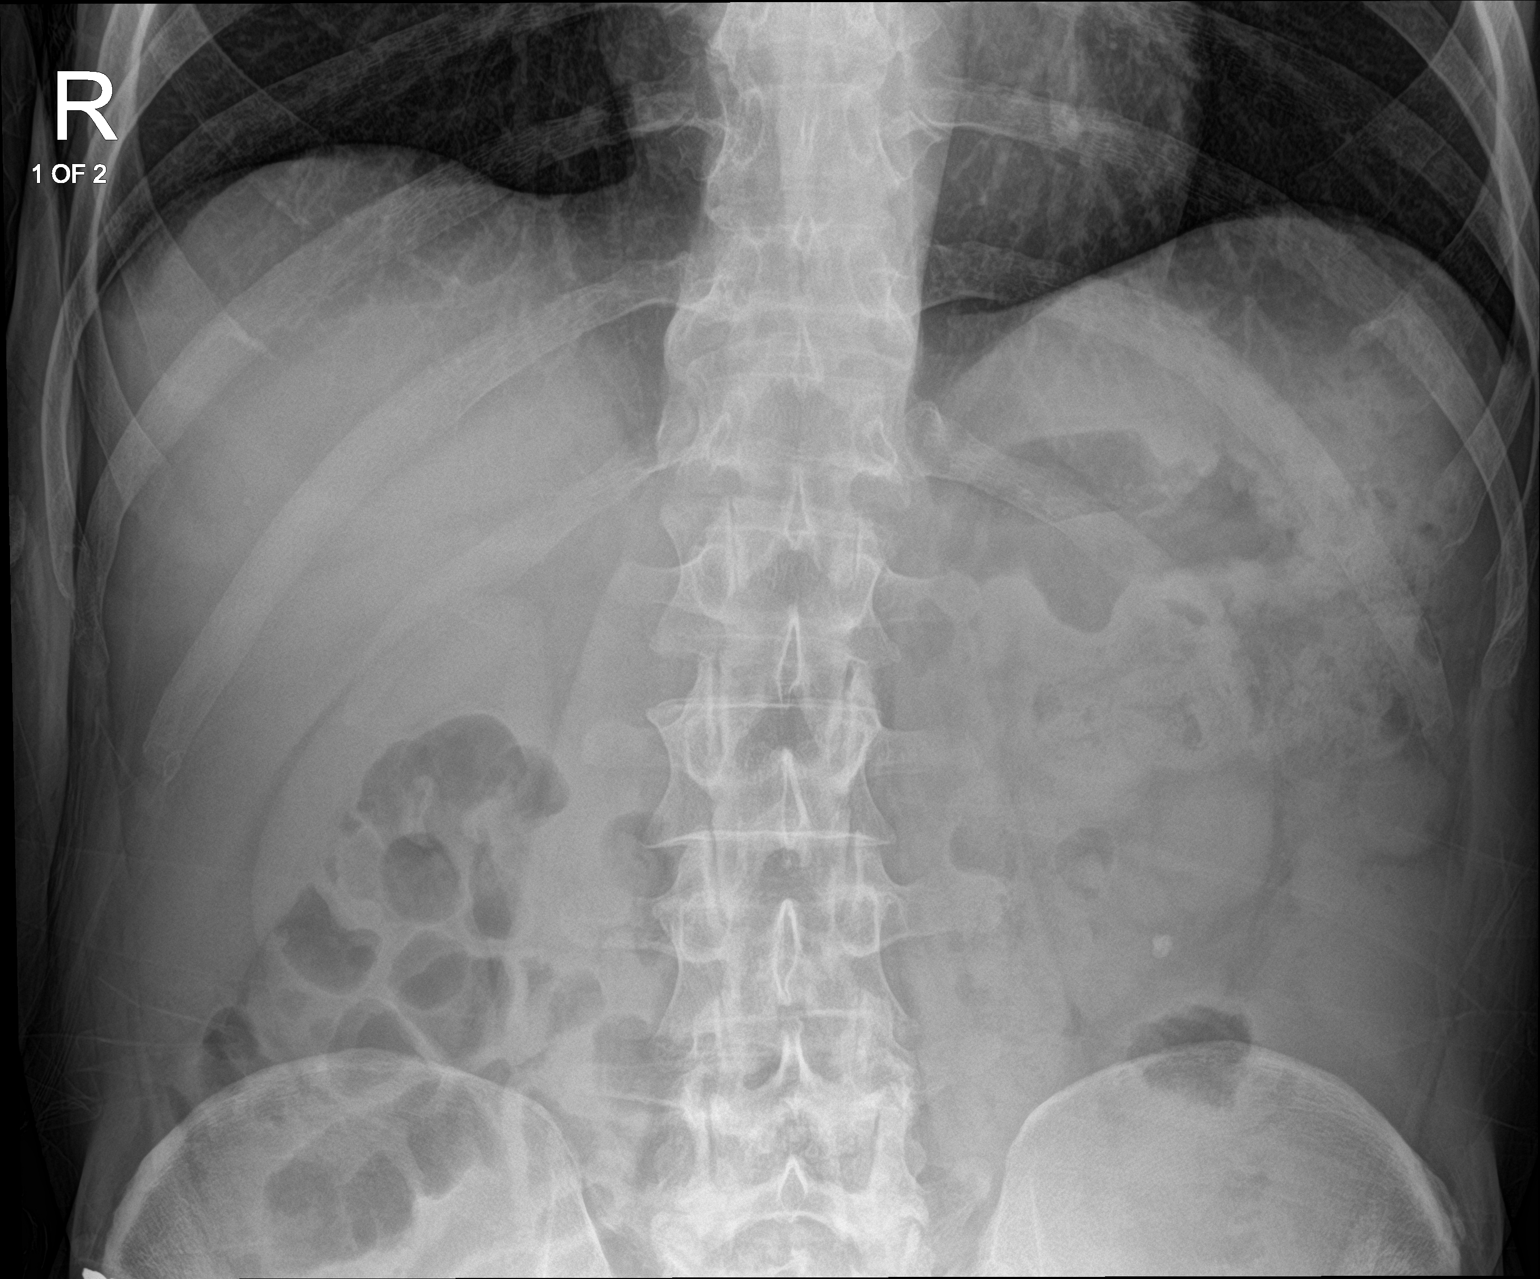

[2 of 2 positions shown; findings below may reference images not displayed]

FINDINGS: Scattered large and small bowel gas is noted. Previously seen right
lower pole renal stone is not well appreciated. No definitive
ureteral calculi are seen. These changes are consistent with the
known history of recent lithotripsy. Lower pole nonobstructing stone
is noted on the left measuring almost 6 mm. No ureteral stones are
seen. Vascular calcifications are noted.
IMPRESSION: Resolution of previously seen right-sided stone following
lithotripsy.

Stable left lower pole stone.

## 2023-09-18 ENCOUNTER — Encounter: Payer: Self-pay | Admitting: Internal Medicine

## 2023-09-18 ENCOUNTER — Other Ambulatory Visit: Payer: Self-pay | Admitting: *Deleted

## 2023-09-18 ENCOUNTER — Inpatient Hospital Stay (HOSPITAL_COMMUNITY)
Admission: RE | Admit: 2023-09-18 | Discharge: 2023-09-18 | Disposition: A | Discharge status: Home / Self Care | Source: Ambulatory Visit | Attending: Internal Medicine | Admitting: Internal Medicine

## 2023-09-18 ENCOUNTER — Ambulatory Visit (HOSPITAL_COMMUNITY)
Admission: RE | Admit: 2023-09-18 | Discharge: 2023-09-18 | Disposition: A | Discharge status: Home / Self Care | Source: Ambulatory Visit | Attending: Internal Medicine | Admitting: Internal Medicine

## 2023-09-18 ENCOUNTER — Ambulatory Visit (AMBULATORY_SURGERY_CENTER): Admitting: Internal Medicine

## 2023-09-18 ENCOUNTER — Encounter (HOSPITAL_COMMUNITY): Payer: Self-pay | Admitting: Internal Medicine

## 2023-09-18 VITALS — BP 115/72 | HR 117 | Temp 98.0°F | Resp 18 | Ht 74.0 in | Wt 145.0 lb

## 2023-09-18 VITALS — BP 98/74 | HR 88 | Ht 74.0 in | Wt 155.0 lb

## 2023-09-18 DIAGNOSIS — I4891 Unspecified atrial fibrillation: Secondary | ICD-10-CM | POA: Insufficient documentation

## 2023-09-18 DIAGNOSIS — I498 Other specified cardiac arrhythmias: Secondary | ICD-10-CM

## 2023-09-18 DIAGNOSIS — R002 Palpitations: Secondary | ICD-10-CM

## 2023-09-18 DIAGNOSIS — Z860100 Personal history of colon polyps, unspecified: Secondary | ICD-10-CM

## 2023-09-18 MED ORDER — SODIUM CHLORIDE 0.9 % IV SOLN: 500.0000 mL | Freq: Once | INTRAVENOUS | Status: DC

## 2023-09-18 NOTE — Progress Notes (Signed)
 GASTROENTEROLOGY PROCEDURE H&P NOTE   Primary Care Physician: Lazoff, Shawn P, DO    Reason for Procedure:  History of numerous adenomatous  Plan:    Colonoscopy  Patient is appropriate for endoscopic procedure(s) in the ambulatory ([facility]) setting.  The nature of the procedure, as well as the risks, benefits, and alternatives were carefully and thoroughly reviewed with the patient. Ample time for discussion and questions allowed. The patient understood, was satisfied, and agreed to proceed.     HPI: Kenneth Gilbert is a 58 y.o. male who presents for surveillance colonoscopy.  Medical history as below.  Tolerated the prep.  No recent chest pain or shortness of breath.  No abdominal pain today.  On initial exam heart rate was regular but just before sedation we noted a rhythm change which was irregular and tachycardia, narrow complex.  Very likely atrial fibrillation.  On further questioning he does admit to spells of palpitations and lightheadedness.  He had been referred to cardiology but this has not been done yet.  At this point he is not appropriate for sedation.  We will cancel the procedures with apologies after going through the prep.  I will arrange for him to see cardiology ASAP for possible atrial fibrillation  Repeat colonoscopy once cardiology has evaluated and cleared for repeat colonoscopy.  Colonoscopy is indicated whenever this occurs.  Past Medical History:  Diagnosis Date   Allergy    Arthritis    Dizziness    ETOH abuse    Stopped drinking 4 yrs   Hard of hearing    Headache    Hyperlipidemia     Past Surgical History:  Procedure Laterality Date   arm surgery Right    arm surgery Left    COLONOSCOPY     EXTRACORPOREAL SHOCK WAVE LITHOTRIPSY Right 03/28/2021   Procedure: EXTRACORPOREAL SHOCK WAVE LITHOTRIPSY (ESWL);  Surgeon: Marco Severs, MD;  Location: [facility];  Service: Urology;  Laterality: Right;   EXTRACORPOREAL SHOCK WAVE LITHOTRIPSY  Left 07/11/2021   Procedure: EXTRACORPOREAL SHOCK WAVE LITHOTRIPSY (ESWL);  Surgeon: Mellie Sprinkle., MD;  Location: [facility];  Service: Urology;  Laterality: Left;   INNER EAR SURGERY     as a child   SHOULDER SURGERY Left     Prior to Admission medications   Medication Sig Start Date End Date Taking? Authorizing Provider  meloxicam (MOBIC) 15 MG tablet Take 1 tablet by mouth daily. 07/18/23 01/14/24 Yes [provider]  clotrimazole -betamethasone  (LOTRISONE ) cream Apply 1 Application topically daily. 07/03/23   Dot Gazella, DPM  terbinafine  (LAMISIL ) 250 MG tablet Take 1 tablet (250 mg total) by mouth daily. 07/03/23   Dot Gazella, DPM    Current Outpatient Medications  Medication Sig Dispense Refill   meloxicam (MOBIC) 15 MG tablet Take 1 tablet by mouth daily.     clotrimazole -betamethasone  (LOTRISONE ) cream Apply 1 Application topically daily. 45 g 3   terbinafine  (LAMISIL ) 250 MG tablet Take 1 tablet (250 mg total) by mouth daily. 90 tablet 0   Current Facility-Administered Medications  Medication Dose Route Frequency Provider Last Rate Last Admin   0.9 %  sodium chloride  infusion  500 mL Intravenous Once Vennie Salsbury, Amber Bail, MD        Allergies as of 09/18/2023 - Review Complete 09/18/2023  Allergen Reaction Noted   Codeine Other (See Comments) 08/18/2009   Topamax  [topiramate ] Other (See Comments) 11/20/2018   Gabapentin  Other (See Comments) 10/09/2019    Family History  Problem  Relation Age of Onset   Heart disease Mother    Cancer Father        unknown type   Cirrhosis Sister    Cancer Brother    Colon cancer Neg Hx    Colon polyps Neg Hx    Esophageal cancer Neg Hx    Rectal cancer Neg Hx    Stomach cancer Neg Hx     Social History   Socioeconomic History   Marital status: Divorced    Spouse name: Not on file   Number of children: 4   Years of education: 12   Highest education level: High school graduate  Occupational History   Occupation: Optician, dispensing, oil changes, rotates tires  Tobacco Use   Smoking status: Former    Current packs/day: 1.00    Average packs/day: 1 pack/day for 45.3 years (45.3 ttl pk-yrs)    Types: Cigarettes    Start date: 06/11/1978   Smokeless tobacco: Never  Vaping Use   Vaping status: Never Used  Substance and Sexual Activity   Alcohol use: No    Comment: 01/09/18 - reports no use in 3 years   Drug use: Yes    Types: Marijuana    Comment: 09/17/23 at 1300   Sexual activity: Yes    Partners: Female  Other Topics Concern   Not on file  Social History Narrative   Lives at home with his son.   Caffeine use:  Drinks 3-4 cans of [place]  tea (20oz cans)   Right-handed.   Social Drivers of Corporate investment banker Strain: Not on file  Food Insecurity: Low Risk  (06/27/2023)   Received from [facility]   Hunger Vital Sign    Worried About Running Out of Food in the Last Year: Never true    Ran Out of Food in the Last Year: Never true  Transportation Needs: No Transportation Needs (06/27/2023)   Received from [facility]    In the past 12 months, has lack of reliable transportation kept you from medical appointments, meetings, work or from getting things needed for daily living? : No  Physical Activity: Not on file  Stress: Not on file  Social Connections: Not on file  Intimate Partner Violence: Not on file    Physical Exam: Vital signs in last 24 hours: @BP  93/65   Pulse 98   Temp 98 F (36.7 C) (Skin)   Ht 6\' 2"  (1.88 m)   Wt 145 lb (65.8 kg)   SpO2 98%   BMI 18.62 kg/m  GEN: NAD EYE: Sclerae anicteric ENT: MMM CV: Non-tachycardic Pulm: CTA b/l GI: Soft, NT/ND NEURO:  Alert & Oriented x 3   [name], MD [facility]  09/18/2023 8:22 AM

## 2023-09-18 NOTE — Progress Notes (Signed)
 Primary Care Physician: Lazoff, Shawn P, DO Primary Cardiologist: None Electrophysiologist: None     Referring Physician: Dr. Darolyn Elks is a 58 y.o. male with a history of HLD, tobacco use, aortic and coronary atherosclerosis by CT imaging, former ETOH use, atrial fibrillation who presents for consultation in the [facility]. Patient was about to undergo colonoscopy earlier today and it was noted his heart rhythm became irregular strongly suggesting Afib. Procedure was canceled. Patient has a CHADS2VASC score of 1.  On evaluation today, he is currently in NSR. He does not have ECG today. He has noted maybe having more candy/chocolate lately due to tobacco craving. He notes having a heat stroke several years ago and feels like this contributed to neurological issues that have been worked up with no significant findings as of yet.   Today, he denies symptoms of palpitations, chest pain, shortness of breath, orthopnea, PND, lower extremity edema, dizziness, presyncope, syncope, snoring, daytime somnolence, bleeding, or neurologic sequela. The patient is tolerating medications without difficulties and is otherwise without complaint today.   he has a BMI of Body mass index is 19.9 kg/m.Aaron Aas Filed Weights   09/18/23 1440  Weight: 70.3 kg    No current outpatient medications on file.   Current Facility-Administered Medications  Medication Dose Route Frequency Provider Last Rate Last Admin   0.9 %  sodium chloride  infusion  500 mL Intravenous Once Pyrtle, Amber Bail, MD        Atrial Fibrillation Management history:  Previous antiarrhythmic drugs: none Previous cardioversions: none Previous ablations: none Anticoagulation history: none   ROS- All systems are reviewed and negative except as per the HPI above.  Physical Exam: BP 98/74   Pulse 88   Ht 6\' 2"  (1.88 m)   Wt 70.3 kg   BMI 19.90 kg/m   GEN: Well nourished, well developed in  no acute distress NECK: No JVD; No carotid bruits CARDIAC: Regular rate and rhythm, no murmurs, rubs, gallops RESPIRATORY:  Clear to auscultation without rales, wheezing or rhonchi  ABDOMEN: Soft, non-tender, non-distended EXTREMITIES:  No edema; No deformity   EKG today demonstrates  Vent. rate 88 BPM PR interval 134 ms QRS duration 96 ms QT/QTcB 356/430 ms P-R-T axes 77 -40 16 Normal sinus rhythm with sinus arrhythmia Right atrial enlargement Left axis deviation Incomplete right bundle branch block Abnormal ECG When compared with ECG of 19-Dec-2017 10:03, PREVIOUS ECG IS PRESENT Confirmed by Minnie Amber [phone]) on 09/18/2023 2:58:57 PM  Echo 03/17/2019 demonstrated  1. Left ventricular ejection fraction, by visual estimation, is 55 to  60%. The left ventricle has normal function. There is mildly increased  left ventricular hypertrophy.   2. Left ventricular diastolic parameters are consistent with Grade I  diastolic dysfunction (impaired relaxation).   3. Global right ventricle has normal systolic function.The right  ventricular size is normal. No increase in right ventricular wall  thickness.   4. Left atrial size was normal.   5. Right atrial size was normal.   6. The mitral valve is normal in structure. Trace mitral valve  regurgitation. No evidence of mitral stenosis.   7. The tricuspid valve is normal in structure. Tricuspid valve  regurgitation is not demonstrated.   8. The aortic valve is normal in structure. Aortic valve regurgitation is  not visualized. No evidence of aortic valve sclerosis or stenosis.   9. The pulmonic valve was normal in structure. Pulmonic valve  regurgitation  is trivial.  10. Normal pulmonary artery systolic pressure.  11. The inferior vena cava is normal in size with greater than 50%  respiratory variability, suggesting right atrial pressure of 3 mmHg.    ASSESSMENT & PLAN CHA2DS2-VASc Score = 1  The patient's score is based upon: CHF  History: 0 HTN History: 0 Diabetes History: 0 Stroke History: 0 Vascular Disease History: 1 Age Score: 0 Gender Score: 0       ASSESSMENT AND PLAN: Heart palpitations concerning for Afib   He is currently in NSR. I do not have ECG to review from aborted colonoscopy procedure earlier today. Due to this, will place cardiac monitor x 2 weeks to assess for Afib. We briefly discussed Afib and triggers for arrhythmia.    Follow up will be determined based on monitor results.    Minnie Amber, PA-C  Afib Clinic [facility] [address] [place], [place] [address] [phone]

## 2023-09-18 NOTE — Progress Notes (Signed)
 Case cancelled due to irregular heart rhythm and variable blood pressure

## 2023-09-18 NOTE — Progress Notes (Signed)
 Pt's states no medical or surgical changes since previsit or office visit.

## 2023-09-30 ENCOUNTER — Encounter: Payer: Self-pay | Admitting: Podiatry

## 2023-09-30 ENCOUNTER — Ambulatory Visit (INDEPENDENT_AMBULATORY_CARE_PROVIDER_SITE_OTHER): Payer: Medicare Other | Admitting: Podiatry

## 2023-09-30 DIAGNOSIS — M79674 Pain in right toe(s): Secondary | ICD-10-CM

## 2023-09-30 DIAGNOSIS — M79675 Pain in left toe(s): Secondary | ICD-10-CM | POA: Diagnosis not present

## 2023-09-30 DIAGNOSIS — B351 Tinea unguium: Secondary | ICD-10-CM

## 2023-09-30 MED ORDER — TERBINAFINE HCL 250 MG PO TABS: 250.0000 mg | ORAL_TABLET | Freq: Every day | ORAL | 0 refills | Status: AC

## 2023-09-30 NOTE — Progress Notes (Signed)
   Chief Complaint  Patient presents with   Nail Problem    3 mos. F/U Lamisil  and Lotrisone  cream. 8 pain left great toe. Non diabetic.     SUBJECTIVE Patient presents to office today complaining of elongated, thickened nails that cause pain while ambulating in shoes.  Patient is unable to trim their own nails. Patient is here for further evaluation and treatment.  Past Medical History:  Diagnosis Date   Allergy    Arthritis    Dizziness    ETOH abuse    Stopped drinking 4 yrs   Hard of hearing    Headache    Hyperlipidemia     Allergies  Allergen Reactions   Codeine Other (See Comments)    REACTION: Dizzy   Topamax  [Topiramate ] Other (See Comments)    Kidney stone/hematuria   Gabapentin  Other (See Comments)    Burning sensation in stomach    B/L toes 07/03/2023  OBJECTIVE General Patient is awake, alert, and oriented x 3 and in no acute distress. Derm Skin is dry and supple bilateral. Negative open lesions or macerations. Remaining integument unremarkable. Nails are tender, long, thickened and dystrophic with subungual debris, consistent with onychomycosis, 1-5 bilateral. No signs of infection noted.  There is some slight improvement of the nail plate at the very base of the nails it appears that the nails are growing out nicely clear Vasc  DP and PT pedal pulses palpable bilaterally. Temperature gradient within normal limits.  Neuro grossly intact via light touch Musculoskeletal Exam No symptomatic pedal deformities noted bilateral. Muscular strength within normal limits.  ASSESSMENT 1.  Pain due to onychomycosis of toenails both  PLAN OF CARE 1. Patient evaluated today.  2. Instructed to maintain good pedal hygiene and foot care.  3. Mechanical debridement of nails 1-5 bilaterally performed using a nail nipper. Filed with dremel without incident.  4.  Refill Lamisil  250 mg #90 daily  5.  Return to clinic in 3 mos.    Dot Gazella, DPM [facility]  Dr. Dot Gazella, DPM    [address] [place], [place] [address]                Office [phone]  Fax [phone]

## 2023-10-17 ENCOUNTER — Ambulatory Visit (HOSPITAL_COMMUNITY): Payer: Self-pay | Admitting: Internal Medicine

## 2023-10-17 NOTE — Addendum Note (Signed)
 Encounter addended by: Missouri Amor, RN on: 10/17/2023 1:50 PM  Actions taken: Imaging Exam ended

## 2023-10-24 ENCOUNTER — Telehealth: Payer: Self-pay | Admitting: Internal Medicine

## 2023-10-24 NOTE — Telephone Encounter (Signed)
 Pt scheduled for previsit 12/02/23@1pm . Colon scheduled in the [facility] 01/02/24@1 :30pm. Pt aware of appts.

## 2023-10-24 NOTE — Telephone Encounter (Signed)
 Patient called and stated that he wishes to speak to the nurse regarding a colonoscopy procedure. Patient is requesting a call back. Please advise.

## 2023-10-24 NOTE — Telephone Encounter (Signed)
 Yes, but I also have included Rogena Class, CRNA to ensure okay with anesthesia for [facility] procedure

## 2023-10-24 NOTE — Telephone Encounter (Signed)
 Pt wore a heart monitor with Cardiology and no A fib was seen. See below:  Nathanel Bal, PA-C to Tess Fife, RN JS   10/17/23  2:38 PM Result Note Please advise the patient cardiac monitor did not show any Afib. There is no follow up indicated in the Afib clinic, nor any medications at this time. I would recommend a Kardiamobile device or other rhythm monitoring device to use at home, and can follow up PRN if notes Afib in the future.  Pt is calling to reschedule colon, can he be rescheduled in the [facility] now? Please advise.

## 2023-12-02 ENCOUNTER — Ambulatory Visit (AMBULATORY_SURGERY_CENTER)

## 2023-12-02 ENCOUNTER — Telehealth: Payer: Self-pay

## 2023-12-02 VITALS — Ht 74.0 in | Wt 154.0 lb

## 2023-12-02 DIAGNOSIS — Z860100 Personal history of colon polyps, unspecified: Secondary | ICD-10-CM

## 2023-12-02 MED ORDER — NA SULFATE-K SULFATE-MG SULF 17.5-3.13-1.6 GM/177ML PO SOLN: 1.0000 | Freq: Once | ORAL | 0 refills | Status: AC

## 2023-12-02 NOTE — Progress Notes (Signed)

## 2023-12-02 NOTE — Telephone Encounter (Signed)
 [facility] seems appropriate but will have anesthesia review once more for completeness JMP

## 2023-12-02 NOTE — Telephone Encounter (Signed)
 Patient answered call and was able to complete pre visit.  During pre visit patient discussed having a possible Afib episode on the last colonoscopy that was cancelled.  Looking at the notes from 10/17/23, he wore a monitor for several days but no episode was recorded.   10/17/23  2:38 PM Result Note Please advise the patient cardiac monitor did not show any Afib. There is no follow up indicated in the Afib clinic, nor any medications at this time. I would recommend a Kardiamobile device or other rhythm monitoring device to use at home, and can follow up PRN if notes Afib in the future.  The patient stated that nothing was found and that no medications were prescribed.  Kenneth Gilbert cleared in on 10/24/23 for [facility].   Please advise as to proceed at [facility] or move to the hospital.

## 2023-12-02 NOTE — Telephone Encounter (Signed)
 No Show pre visit appt.  Called and could not leave a message, the mailbox is full. Will try back in 5 min

## 2023-12-30 ENCOUNTER — Ambulatory Visit (INDEPENDENT_AMBULATORY_CARE_PROVIDER_SITE_OTHER): Admitting: Podiatry

## 2023-12-30 ENCOUNTER — Ambulatory Visit (INDEPENDENT_AMBULATORY_CARE_PROVIDER_SITE_OTHER)

## 2023-12-30 DIAGNOSIS — M79674 Pain in right toe(s): Secondary | ICD-10-CM | POA: Diagnosis not present

## 2023-12-30 DIAGNOSIS — M79675 Pain in left toe(s): Secondary | ICD-10-CM | POA: Diagnosis not present

## 2023-12-30 DIAGNOSIS — M25571 Pain in right ankle and joints of right foot: Secondary | ICD-10-CM | POA: Diagnosis not present

## 2023-12-30 DIAGNOSIS — B351 Tinea unguium: Secondary | ICD-10-CM | POA: Diagnosis not present

## 2023-12-30 DIAGNOSIS — M19071 Primary osteoarthritis, right ankle and foot: Secondary | ICD-10-CM | POA: Diagnosis not present

## 2023-12-30 MED ORDER — BETAMETHASONE SOD PHOS & ACET 6 (3-3) MG/ML IJ SUSP
3.0000 mg | Freq: Once | INTRAMUSCULAR | Status: AC
  Administered 2023-12-30: 3 mg via INTRA_ARTICULAR

## 2023-12-30 NOTE — Addendum Note (Signed)
 Addended by: JANIT THRESA HERO on: 12/30/2023 09:43 AM   Modules accepted: Orders

## 2023-12-30 NOTE — Progress Notes (Addendum)
   Chief Complaint  Patient presents with   Nail Problem    Patient is here for routine foot care for Pain due to onychomycosis of toenails of both feet    SUBJECTIVE Patient presents to office today complaining of elongated, thickened nails that cause pain while ambulating in shoes.  Patient is unable to trim their own nails.  Overall he has noticed significant improvement with oral Lamisil   Also new complaint today regarding right ankle pain.  This has been a chronic onset for several years.  He remembers around 2018 he sustained an injury while working to the right ankle.  He has had chronic pain ever since.  Past Medical History:  Diagnosis Date   Allergy    Arthritis    Dizziness    ETOH abuse    Stopped drinking 4 yrs   Hard of hearing    Headache    Hyperlipidemia     Allergies  Allergen Reactions   Codeine Other (See Comments)    REACTION: Dizzy   Topamax  [Topiramate ] Other (See Comments)    Kidney stone/hematuria   Gabapentin  Other (See Comments)    Burning sensation in stomach    B/L toes 07/03/2023  OBJECTIVE General Patient is awake, alert, and oriented x 3 and in no acute distress. Derm Skin is dry and supple bilateral. Negative open lesions or macerations. Remaining integument unremarkable. Nails are tender, long, thickened and dystrophic with subungual debris, consistent with onychomycosis, 1-5 bilateral. No signs of infection noted.  There is some slight improvement of the nail plate at the very base of the nails it appears that the nails are growing out nicely clear Vasc  DP and PT pedal pulses palpable bilaterally. Temperature gradient within normal limits.  Neuro grossly intact via light touch Musculoskeletal Exam pain with palpation diffusely around the tibiotalar joint right ankle as well as along the Achilles tendon right. Radiographic exam RT ankle 12/30/2023 moderate degenerative changes noted to the tibiotalar joint.  No acute fractures identified.  No  calcifications noted.  ASSESSMENT 1.  Pain due to onychomycosis of toenails both  -Instructed to maintain good pedal hygiene and foot care.  -Mechanical debridement of nails 1-5 bilaterally performed using a nail nipper. Filed with dremel without incident.  -Continue oral Lamisil  prescribed last visit on 09/30/2023 until completed   2.  Arthritis right ankle 3.  Achilles tendinitis right ankle 4.  History of injury right ankle 2018  -Injection of 0.5 cc Celestone  Soluspan injected into the tibiotalar joint right as well as along the lateral aspect of the Achilles tendon.  Care taken to avoid direct injection into the Achilles -Recommend good supportive tennis shoes and sneakers -Return to clinic 4 weeks.  If there is no improvement may need to consider MRI right ankle   Thresa EMERSON Sar, DPM [facility]  Dr. Thresa EMERSON Sar, DPM    [address] [place], [place] [address]                Office [phone]  Fax [phone]

## 2023-12-30 NOTE — Addendum Note (Signed)
 Addended by: Mandrell Vangilder L on: 12/30/2023 09:25 AM   Modules accepted: Orders

## 2024-01-01 ENCOUNTER — Telehealth: Payer: Self-pay | Admitting: Internal Medicine

## 2024-01-01 NOTE — Telephone Encounter (Signed)
 Patient had questions about his prep and wanted to know if he could do clear liquids tomorrow as well. Explained to him that he can do clear liquids until 10:30am but nothing else in his mouth after 10:30 am.He understood and has no further questions at this time.

## 2024-01-01 NOTE — Telephone Encounter (Signed)
 Inbound call from pt requesting to get a call back from the nurse in regards to his prep instructions due to him having question. Patient is requesting a call back today due to his procedure being scheduled for August the 21 st. please advise.

## 2024-01-02 ENCOUNTER — Ambulatory Visit: Admitting: Internal Medicine

## 2024-01-02 ENCOUNTER — Encounter: Payer: Self-pay | Admitting: Internal Medicine

## 2024-01-02 VITALS — BP 99/62 | HR 61 | Temp 98.4°F | Resp 12 | Ht 74.0 in | Wt 140.0 lb

## 2024-01-02 DIAGNOSIS — D123 Benign neoplasm of transverse colon: Secondary | ICD-10-CM | POA: Diagnosis not present

## 2024-01-02 DIAGNOSIS — Z1211 Encounter for screening for malignant neoplasm of colon: Secondary | ICD-10-CM | POA: Diagnosis not present

## 2024-01-02 DIAGNOSIS — Z860100 Personal history of colon polyps, unspecified: Secondary | ICD-10-CM

## 2024-01-02 DIAGNOSIS — Z860101 Personal history of adenomatous and serrated colon polyps: Secondary | ICD-10-CM

## 2024-01-02 DIAGNOSIS — K648 Other hemorrhoids: Secondary | ICD-10-CM

## 2024-01-02 MED ORDER — SODIUM CHLORIDE 0.9 % IV SOLN: 500.0000 mL | INTRAVENOUS | Status: AC

## 2024-01-02 NOTE — Patient Instructions (Signed)

## 2024-01-02 NOTE — Progress Notes (Signed)
 Called to room to assist during endoscopic procedure.  Patient ID and intended procedure confirmed with present staff. Received instructions for my participation in the procedure from the performing physician.

## 2024-01-02 NOTE — Progress Notes (Signed)
 Report to PACU, RN, vss, BBS= Clear.

## 2024-01-02 NOTE — Op Note (Signed)
 [facility] Patient Name: Kenneth Gilbert Procedure Date: 01/02/2024 1:46 PM MRN: [id number] Endoscopist: Gordy CHRISTELLA Starch , MD, [id number] Age: 58 Referring MD:  Date of Birth: [birth year] Gender: Male Account #: [id number] Procedure:                Colonoscopy Indications:              High risk colon cancer surveillance: Personal                            history of numerous adenomas, Last colonoscopy:                            February 2023 (TA x 10), Jan 2019 (TA x 15) Medicines:                Monitored Anesthesia Care Procedure:                Pre-Anesthesia Assessment:                           - Prior to the procedure, a History and Physical                            was performed, and patient medications and                            allergies were reviewed. The patient's tolerance of                            previous anesthesia was also reviewed. The risks                            and benefits of the procedure and the sedation                            options and risks were discussed with the patient.                            All questions were answered, and informed consent                            was obtained. Prior Anticoagulants: The patient has                            taken no anticoagulant or antiplatelet agents. ASA                            Grade Assessment: II - A patient with mild systemic                            disease. After reviewing the risks and benefits,                            the patient was deemed in satisfactory condition to  undergo the procedure.                           After obtaining informed consent, the colonoscope                            was passed under direct vision. Throughout the                            procedure, the patient's blood pressure, pulse, and                            oxygen saturations were monitored continuously. The                            Olympus Scope SN:  [id number] was introduced through                            the anus and advanced to the cecum, identified by                            appendiceal orifice and ileocecal valve. The                            colonoscopy was performed without difficulty. The                            patient tolerated the procedure well. The quality                            of the bowel preparation was good. The ileocecal                            valve, appendiceal orifice, and rectum were                            photographed. Scope In: 2:02:30 PM Scope Out: 2:15:00 PM Scope Withdrawal Time: 0 hours 10 minutes 54 seconds  Total Procedure Duration: 0 hours 12 minutes 30 seconds  Findings:                 The digital rectal exam was normal.                           Two sessile polyps were found in the transverse                            colon. The polyps were 5 to 6 mm in size. These                            polyps were removed with a cold snare. Resection                            and retrieval were complete.  Internal hemorrhoids were found during                            retroflexion. The hemorrhoids were small. Complications:            No immediate complications. Estimated Blood Loss:     Estimated blood loss was minimal. Impression:               - Two 5 to 6 mm polyps in the transverse colon,                            removed with a cold snare. Resected and retrieved.                           - Small internal hemorrhoids. Recommendation:           - Patient has a contact number available for                            emergencies. The signs and symptoms of potential                            delayed complications were discussed with the                            patient. Return to normal activities tomorrow.                            Written discharge instructions were provided to the                            patient.                           - Resume  previous diet.                           - Continue present medications.                           - Await pathology results.                           - Repeat colonoscopy in 3 years for surveillance. Gordy CHRISTELLA Starch, MD 01/02/2024 2:20:14 PM This report has been signed electronically.

## 2024-01-02 NOTE — Progress Notes (Signed)
 GASTROENTEROLOGY PROCEDURE H&P NOTE   Primary Care Physician: Lazoff, Shawn P, DO    Reason for Procedure:  History of numerous adenomas of the colon  Plan:    Colonoscopy   Patient is appropriate for endoscopic procedure(s) in the ambulatory ([facility]) setting.  The nature of the procedure, as well as the risks, benefits, and alternatives were carefully and thoroughly reviewed with the patient. Ample time for discussion and questions allowed. The patient understood, was satisfied, and agreed to proceed.     HPI: Kenneth Gilbert is a 58 y.o. male who presents for colonoscopy.  Medical history as below.  Tolerated the prep.  No recent chest pain or shortness of breath.  No abdominal pain today.  Past Medical History:  Diagnosis Date   Allergy    Arthritis    Dizziness    ETOH abuse    Stopped drinking 4 yrs   Hard of hearing    Headache    Hyperlipidemia     Past Surgical History:  Procedure Laterality Date   arm surgery Right    arm surgery Left    COLONOSCOPY     EXTRACORPOREAL SHOCK WAVE LITHOTRIPSY Right 03/28/2021   Procedure: EXTRACORPOREAL SHOCK WAVE LITHOTRIPSY (ESWL);  Surgeon: Sherrilee Belvie CROME, MD;  Location: [facility];  Service: Urology;  Laterality: Right;   EXTRACORPOREAL SHOCK WAVE LITHOTRIPSY Left 07/11/2021   Procedure: EXTRACORPOREAL SHOCK WAVE LITHOTRIPSY (ESWL);  Surgeon: Roseann Adine PARAS., MD;  Location: [facility];  Service: Urology;  Laterality: Left;   INNER EAR SURGERY     as a child   SHOULDER SURGERY Left     Prior to Admission medications   Medication Sig Start Date End Date Taking? Authorizing Provider  terbinafine  (LAMISIL ) 250 MG tablet Take 1 tablet (250 mg total) by mouth daily. Patient not taking: Reported on 01/02/2024 09/30/23   Janit Thresa HERO, DPM    Current Outpatient Medications  Medication Sig Dispense Refill   terbinafine  (LAMISIL ) 250 MG tablet Take 1 tablet (250 mg total) by mouth daily. (Patient not taking: Reported on  01/02/2024) 90 tablet 0   Current Facility-Administered Medications  Medication Dose Route Frequency Provider Last Rate Last Admin   0.9 %  sodium chloride  infusion  500 mL Intravenous Once Irlanda Croghan, Gordy HERO, MD       0.9 %  sodium chloride  infusion  500 mL Intravenous Continuous Novalyn Lajara, Gordy HERO, MD        Allergies as of 01/02/2024 - Review Complete 01/02/2024  Allergen Reaction Noted   Codeine Other (See Comments) 08/18/2009   Topamax  [topiramate ] Other (See Comments) 11/20/2018   Gabapentin  Other (See Comments) 10/09/2019    Family History  Problem Relation Age of Onset   Heart disease Mother    Cancer Father        unknown type   Cirrhosis Sister    Cancer Brother    Colon cancer Neg Hx    Colon polyps Neg Hx    Esophageal cancer Neg Hx    Rectal cancer Neg Hx    Stomach cancer Neg Hx     Social History   Socioeconomic History   Marital status: Divorced    Spouse name: Not on file   Number of children: 4   Years of education: 12   Highest education level: High school graduate  Occupational History   Occupation: Actor, oil changes, rotates tires  Tobacco Use   Smoking status: Former    Current packs/day: 1.00  Average packs/day: 1 pack/day for 45.6 years (45.6 ttl pk-yrs)    Types: Cigarettes    Start date: 06/11/1978   Smokeless tobacco: Never   Tobacco comments:    Former smoker 09/18/23  Vaping Use   Vaping status: Never Used  Substance and Sexual Activity   Alcohol use: No    Comment: 01/09/18 - reports no use in 3 years   Drug use: Yes    Types: Marijuana    Comment: last smoked 8/19   Sexual activity: Yes    Partners: Female  Other Topics Concern   Not on file  Social History Narrative   Lives at home with his son.   Caffeine use:  Drinks 3-4 cans of [place]  tea (20oz cans)   Right-handed.   Social Drivers of Corporate investment banker Strain: Not on file  Food Insecurity: Low Risk  (06/27/2023)   Received from [facility]   Hunger  Vital Sign    Within the past 12 months, you worried that your food would run out before you got money to buy more: Never true    Within the past 12 months, the food you bought just didn't last and you didn't have money to get more. : Never true  Transportation Needs: No Transportation Needs (06/27/2023)   Received from [facility]    In the past 12 months, has lack of reliable transportation kept you from medical appointments, meetings, work or from getting things needed for daily living? : No  Physical Activity: Not on file  Stress: Not on file  Social Connections: Not on file  Intimate Partner Violence: Not on file    Physical Exam: Vital signs in last 24 hours: @BP  107/63   Pulse 65   Temp 98.4 F (36.9 C)   Ht 6' 2 (1.88 m)   Wt 140 lb (63.5 kg)   SpO2 97%   BMI 17.97 kg/m  GEN: NAD EYE: Sclerae anicteric ENT: MMM CV: Non-tachycardic Pulm: CTA b/l GI: Soft, NT/ND NEURO:  Alert & Oriented x 3   Gordy Starch, MD [facility]  01/02/2024 1:52 PM

## 2024-01-02 NOTE — Progress Notes (Signed)
 Pt's states no medical or surgical changes since previsit or office visit.

## 2024-01-03 ENCOUNTER — Telehealth: Payer: Self-pay | Admitting: Lactation Services

## 2024-01-03 NOTE — Telephone Encounter (Signed)
 No answer; no permission to leave a voicemail

## 2024-01-03 NOTE — Telephone Encounter (Signed)
 PT returned call. Stated that he feels great and has no concerns.

## 2024-01-06 ENCOUNTER — Encounter (INDEPENDENT_AMBULATORY_CARE_PROVIDER_SITE_OTHER)

## 2024-01-06 ENCOUNTER — Ambulatory Visit (INDEPENDENT_AMBULATORY_CARE_PROVIDER_SITE_OTHER)

## 2024-01-06 ENCOUNTER — Ambulatory Visit (INDEPENDENT_AMBULATORY_CARE_PROVIDER_SITE_OTHER): Admitting: Podiatry

## 2024-01-06 DIAGNOSIS — M19071 Primary osteoarthritis, right ankle and foot: Secondary | ICD-10-CM

## 2024-01-06 MED ORDER — MELOXICAM 15 MG PO TABS: 15.0000 mg | ORAL_TABLET | Freq: Every day | ORAL | 1 refills | Status: AC

## 2024-01-06 NOTE — Progress Notes (Signed)
   Chief Complaint  Patient presents with   Foot Pain    Right ankle pain and discomfort     HPI: 58 y.o. male presenting today for new complaint of pain and tenderness associated to the lateral aspect of the right foot secondary to a sprain injury.  He states that he sprained his foot over the weekend.  He has been unable to bear weight without pain  Past Medical History:  Diagnosis Date   Allergy    Arthritis    Dizziness    ETOH abuse    Stopped drinking 4 yrs   Hard of hearing    Headache    Hyperlipidemia     Past Surgical History:  Procedure Laterality Date   arm surgery Right    arm surgery Left    COLONOSCOPY     EXTRACORPOREAL SHOCK WAVE LITHOTRIPSY Right 03/28/2021   Procedure: EXTRACORPOREAL SHOCK WAVE LITHOTRIPSY (ESWL);  Surgeon: Sherrilee Belvie CROME, MD;  Location: [facility];  Service: Urology;  Laterality: Right;   EXTRACORPOREAL SHOCK WAVE LITHOTRIPSY Left 07/11/2021   Procedure: EXTRACORPOREAL SHOCK WAVE LITHOTRIPSY (ESWL);  Surgeon: Roseann Adine PARAS., MD;  Location: [facility];  Service: Urology;  Laterality: Left;   INNER EAR SURGERY     as a child   SHOULDER SURGERY Left     Allergies  Allergen Reactions   Codeine Other (See Comments)    REACTION: Dizzy   Topamax  [Topiramate ] Other (See Comments)    Kidney stone/hematuria   Gabapentin  Other (See Comments)    Burning sensation in stomach     Physical Exam: General: The patient is alert and oriented x3 in no acute distress.  Dermatology: Skin is warm, dry and supple bilateral lower extremities.  No open wounds  Vascular: Palpable pedal pulses bilaterally. Capillary refill within normal limits.  No appreciable edema.  No erythema.  Neurological: Grossly intact via light touch  Musculoskeletal Exam: Tenderness with palpation around the lateral aspect of the right midtarsal joint with associated ecchymosis and edema  Radiographic Exam RT foot and ankle 01/06/2024:  Normal osseous mineralization. Joint  spaces preserved.  No fractures or osseous irregularities noted.  Impression: Negative  Assessment/Plan of Care: 1.  Foot sprain right  -Patient evaluated.  X-rays reviewed -Cam boot dispensed.  WBAT x 4 weeks -Prescription for meloxicam  15 mg daily PRN -Return to clinic 4 weeks       Thresa EMERSON Sar, DPM [facility]  Dr. Thresa EMERSON Sar, DPM    [address] [place], [place] [address]                Office [phone]  Fax [phone]

## 2024-01-07 LAB — SURGICAL PATHOLOGY

## 2024-01-09 ENCOUNTER — Ambulatory Visit: Payer: Self-pay | Admitting: Internal Medicine

## 2024-01-29 ENCOUNTER — Encounter: Payer: Self-pay | Admitting: Podiatry

## 2024-01-29 ENCOUNTER — Ambulatory Visit (INDEPENDENT_AMBULATORY_CARE_PROVIDER_SITE_OTHER): Admitting: Podiatry

## 2024-01-29 VITALS — Ht 74.0 in | Wt 140.0 lb

## 2024-01-29 DIAGNOSIS — M7661 Achilles tendinitis, right leg: Secondary | ICD-10-CM | POA: Diagnosis not present

## 2024-01-29 DIAGNOSIS — M19071 Primary osteoarthritis, right ankle and foot: Secondary | ICD-10-CM | POA: Diagnosis not present

## 2024-01-29 NOTE — Progress Notes (Signed)
   Chief Complaint  Patient presents with   Ankle Pain    Pt is here to f/u on right ankle due to pain, he states this past Saturday he walk to bathroom without the boot and his ankle gave out and has been in pain since.    HPI: 58 y.o. male presenting today for follow-up evaluation of his significant pain and tenderness associated to the right foot and ankle.  He states that he continues to have severe pain and tenderness.  He has not been able to bear weight on the foot without the boot.  Even with the boot he continues to have exquisite pain and tenderness  Past Medical History:  Diagnosis Date   Allergy    Arthritis    Dizziness    ETOH abuse    Stopped drinking 4 yrs   Hard of hearing    Headache    Hyperlipidemia     Past Surgical History:  Procedure Laterality Date   arm surgery Right    arm surgery Left    COLONOSCOPY     EXTRACORPOREAL SHOCK WAVE LITHOTRIPSY Right 03/28/2021   Procedure: EXTRACORPOREAL SHOCK WAVE LITHOTRIPSY (ESWL);  Surgeon: Sherrilee Belvie CROME, MD;  Location: [facility];  Service: Urology;  Laterality: Right;   EXTRACORPOREAL SHOCK WAVE LITHOTRIPSY Left 07/11/2021   Procedure: EXTRACORPOREAL SHOCK WAVE LITHOTRIPSY (ESWL);  Surgeon: Roseann Adine PARAS., MD;  Location: [facility];  Service: Urology;  Laterality: Left;   INNER EAR SURGERY     as a child   SHOULDER SURGERY Left     Allergies  Allergen Reactions   Codeine Other (See Comments)    REACTION: Dizzy   Topamax  [Topiramate ] Other (See Comments)    Kidney stone/hematuria   Gabapentin  Other (See Comments)    Burning sensation in stomach     Physical Exam: General: The patient is alert and oriented x3 in no acute distress.  Dermatology: Skin is warm, dry and supple bilateral lower extremities.  No open wounds  Vascular: Palpable pedal pulses bilaterally. Capillary refill within normal limits.  No appreciable edema.  No erythema.  Neurological: Grossly intact via light touch  Musculoskeletal  Exam: Significant pain and tenderness around the ankle and foot diffusely.  Radiographic Exam RT foot and ankle 01/06/2024:  Normal osseous mineralization. Joint spaces preserved.  No fractures or osseous irregularities noted.  Impression: Negative  Assessment/Plan of Care: 1.  Severe right foot and ankle pain with edema  -Patient evaluated.   - Unfortunately the patient has had no improvement.  He continues to have severe pain and tenderness.  Order placed for MRI RT ankle wo contrast  -Continue WBAT as tolerated in the cam boot -Return to clini after MRI to review the results and discuss further treatment optionsc        Thresa EMERSON Sar, DPM [facility]  Dr. Thresa EMERSON Sar, DPM    [address] [place], [place] [address]                Office [phone]  Fax [phone]

## 2024-01-31 ENCOUNTER — Ambulatory Visit
Admission: RE | Admit: 2024-01-31 | Discharge: 2024-01-31 | Disposition: A | Discharge status: Home / Self Care | Source: Ambulatory Visit | Attending: Podiatry | Admitting: Podiatry

## 2024-01-31 DIAGNOSIS — M19071 Primary osteoarthritis, right ankle and foot: Secondary | ICD-10-CM

## 2024-01-31 DIAGNOSIS — M7661 Achilles tendinitis, right leg: Secondary | ICD-10-CM

## 2024-02-04 ENCOUNTER — Encounter: Payer: Self-pay | Admitting: Internal Medicine

## 2024-02-17 ENCOUNTER — Ambulatory Visit: Admitting: Podiatry

## 2024-02-17 DIAGNOSIS — S82831D Other fracture of upper and lower end of right fibula, subsequent encounter for closed fracture with routine healing: Secondary | ICD-10-CM | POA: Diagnosis not present

## 2024-02-17 NOTE — Progress Notes (Signed)
   Chief Complaint  Patient presents with   Ankle Pain    MRI review.  R ankle remains swollen and painful. Not diabetic .no anti coag.    HPI: 58 y.o. male presenting today for follow-up evaluation of right ankle pain  Past Medical History:  Diagnosis Date   Allergy    Arthritis    Dizziness    ETOH abuse    Stopped drinking 4 yrs   Hard of hearing    Headache    Hyperlipidemia     Past Surgical History:  Procedure Laterality Date   arm surgery Right    arm surgery Left    COLONOSCOPY     EXTRACORPOREAL SHOCK WAVE LITHOTRIPSY Right 03/28/2021   Procedure: EXTRACORPOREAL SHOCK WAVE LITHOTRIPSY (ESWL);  Surgeon: Sherrilee Belvie CROME, MD;  Location: [facility];  Service: Urology;  Laterality: Right;   EXTRACORPOREAL SHOCK WAVE LITHOTRIPSY Left 07/11/2021   Procedure: EXTRACORPOREAL SHOCK WAVE LITHOTRIPSY (ESWL);  Surgeon: Roseann Adine PARAS., MD;  Location: [facility];  Service: Urology;  Laterality: Left;   INNER EAR SURGERY     as a child   SHOULDER SURGERY Left     Allergies  Allergen Reactions   Codeine Other (See Comments)    REACTION: Dizzy   Topamax  [Topiramate ] Other (See Comments)    Kidney stone/hematuria   Gabapentin  Other (See Comments)    Burning sensation in stomach     Physical Exam: General: The patient is alert and oriented x3 in no acute distress.  Dermatology: Skin is warm, dry and supple bilateral lower extremities.  No open wounds  Vascular: Palpable pedal pulses bilaterally. Capillary refill within normal limits.  No appreciable edema.  No erythema.  Neurological: Grossly intact via light touch  Musculoskeletal Exam: There continues to be ankle pain to the right lower extremity mostly to the lateral aspect of the ankle consistent with findings on the MRI of a subacute distal fibula fracture  Radiographic Exam RT foot and ankle 01/06/2024:  Normal osseous mineralization. Joint spaces preserved.  No fractures or osseous irregularities noted.   Impression: Negative  MR ANKLE RIGHT WO CONTRAST 01/31/2024 IMPRESSION: Subacute nondisplaced fracture to the distal lateral malleolus with moderate marrow edema. Overlying subcutaneous edema. Mild to moderate tendinopathy of the peroneal tendons and moderate tenosynovitis.  Assessment/Plan of Care: 1.  Subacute nondisplaced fracture distal fibula RT  -Patient evaluated.  MRI reviewed in detail with the patient -Continues to have right ankle pain so we will continue conservative treatment to manage the fracture.  Fracture is subacute and appears to be stable.  Continue cam boot.  WBAT -Continue meloxicam  15 mg daily -Return to clinic 4 weeks follow-up x-ray       Thresa EMERSON Sar, DPM [facility]  Dr. Thresa EMERSON Sar, DPM    [address] [place], [place] [address]                Office [phone]  Fax [phone]

## 2024-03-03 ENCOUNTER — Telehealth: Payer: Self-pay | Admitting: Podiatry

## 2024-03-03 NOTE — Telephone Encounter (Signed)
 Pt wants to know if you can put a cast on his foot instead of boot .Pt don't believe that the boot is working. When pressure  is applied it really hurts. Pt. believes that the cast will work better .please advise best contact number for pt. [phone]

## 2024-03-04 NOTE — Telephone Encounter (Signed)
 Patient called in to confirm doctor's decision on cast.

## 2024-03-06 NOTE — Telephone Encounter (Signed)
 Patient has been scheduled for 03/09/24 at 400 pm. Patient verbalized confirmation of appointment time, date and address.

## 2024-03-09 ENCOUNTER — Ambulatory Visit (INDEPENDENT_AMBULATORY_CARE_PROVIDER_SITE_OTHER): Admitting: Podiatry

## 2024-03-09 DIAGNOSIS — S82831D Other fracture of upper and lower end of right fibula, subsequent encounter for closed fracture with routine healing: Secondary | ICD-10-CM | POA: Diagnosis not present

## 2024-03-09 NOTE — Progress Notes (Signed)
 Cast placed on right ankle per provider request. Patient instructed on NWB status due to cast placement.

## 2024-03-23 ENCOUNTER — Ambulatory Visit: Admitting: Podiatry

## 2024-03-30 ENCOUNTER — Encounter: Payer: Self-pay | Admitting: Podiatry

## 2024-03-30 ENCOUNTER — Ambulatory Visit: Admitting: Podiatry

## 2024-03-30 VITALS — Ht 74.0 in | Wt 140.0 lb

## 2024-03-30 DIAGNOSIS — S82831D Other fracture of upper and lower end of right fibula, subsequent encounter for closed fracture with routine healing: Secondary | ICD-10-CM

## 2024-03-30 NOTE — Progress Notes (Signed)
   Chief Complaint  Patient presents with   Ankle Injury    Pt is here due to right ankle fracture, states he has been having pain to the side of foot and ankle, on Friday he fell while on knee scooter and hit the foot and toes bent back, he been having pain since.    HPI: 58 y.o. male presenting today for follow-up evaluation of subacute right ankle fracture.  Patient had fiberglass cast placed here in the office 03/09/2024.  He has been NWB using a knee scooter.  No new complaints  Past Medical History:  Diagnosis Date   Allergy    Arthritis    Dizziness    ETOH abuse    Stopped drinking 4 yrs   Hard of hearing    Headache    Hyperlipidemia     Past Surgical History:  Procedure Laterality Date   arm surgery Right    arm surgery Left    COLONOSCOPY     EXTRACORPOREAL SHOCK WAVE LITHOTRIPSY Right 03/28/2021   Procedure: EXTRACORPOREAL SHOCK WAVE LITHOTRIPSY (ESWL);  Surgeon: Sherrilee Belvie CROME, MD;  Location: [facility];  Service: Urology;  Laterality: Right;   EXTRACORPOREAL SHOCK WAVE LITHOTRIPSY Left 07/11/2021   Procedure: EXTRACORPOREAL SHOCK WAVE LITHOTRIPSY (ESWL);  Surgeon: Roseann Adine PARAS., MD;  Location: [facility];  Service: Urology;  Laterality: Left;   INNER EAR SURGERY     as a child   SHOULDER SURGERY Left     Allergies  Allergen Reactions   Codeine Other (See Comments)    REACTION: Dizzy   Topamax  [Topiramate ] Other (See Comments)    Kidney stone/hematuria   Gabapentin  Other (See Comments)    Burning sensation in stomach     Physical Exam: Cast left intact today.  Capillary refill to the toes WNL.  There is no irritations or abrasions from the cast.  Radiographic Exam RT foot and ankle 01/06/2024:  Normal osseous mineralization. Joint spaces preserved.  No fractures or osseous irregularities noted.  Impression: Negative  MR ANKLE RIGHT WO CONTRAST 01/31/2024 IMPRESSION: Subacute nondisplaced fracture to the distal lateral malleolus with moderate marrow  edema. Overlying subcutaneous edema. Mild to moderate tendinopathy of the peroneal tendons and moderate tenosynovitis.  Assessment/Plan of Care: 1.  Subacute nondisplaced fracture distal fibula RT  -Patient evaluated.  -Continue meloxicam  15 mg daily -The fiberglass cast is comfortable so we will go ahead and leave it in place for an additional 3 weeks -Return to clinic 3 weeks for cast removal and follow-up x-ray and to initiate physical therapy      Thresa EMERSON Sar, DPM [facility]  Dr. Thresa EMERSON Sar, DPM    [address] [place], [place] [address]                Office [phone]  Fax [phone]

## 2024-04-20 ENCOUNTER — Encounter: Payer: Self-pay | Admitting: Podiatry

## 2024-04-20 ENCOUNTER — Ambulatory Visit (INDEPENDENT_AMBULATORY_CARE_PROVIDER_SITE_OTHER)

## 2024-04-20 ENCOUNTER — Ambulatory Visit (INDEPENDENT_AMBULATORY_CARE_PROVIDER_SITE_OTHER): Admitting: Podiatry

## 2024-04-20 VITALS — Ht 74.0 in | Wt 140.0 lb

## 2024-04-20 DIAGNOSIS — S82831D Other fracture of upper and lower end of right fibula, subsequent encounter for closed fracture with routine healing: Secondary | ICD-10-CM

## 2024-04-20 DIAGNOSIS — M19071 Primary osteoarthritis, right ankle and foot: Secondary | ICD-10-CM

## 2024-04-20 MED ORDER — BETAMETHASONE SOD PHOS & ACET 6 (3-3) MG/ML IJ SUSP
3.0000 mg | Freq: Once | INTRAMUSCULAR | Status: AC
  Administered 2024-04-20: 3 mg via INTRA_ARTICULAR

## 2024-04-20 NOTE — Progress Notes (Signed)
   Chief Complaint  Patient presents with   Ankle Injury    Pt is here to f/u on right ankle due to fracture, he states the ankle is not doing good, still having pain, but is excited to get the cast off, continues to knee scooter to ambulate.    HPI: 58 y.o. male presenting today for follow-up evaluation of subacute right ankle fracture.  Patient had fiberglass cast placed here in the office 03/09/2024.  He has been NWB using a knee scooter.  No new complaints  Past Medical History:  Diagnosis Date   Allergy    Arthritis    Dizziness    ETOH abuse    Stopped drinking 4 yrs   Hard of hearing    Headache    Hyperlipidemia     Past Surgical History:  Procedure Laterality Date   arm surgery Right    arm surgery Left    COLONOSCOPY     EXTRACORPOREAL SHOCK WAVE LITHOTRIPSY Right 03/28/2021   Procedure: EXTRACORPOREAL SHOCK WAVE LITHOTRIPSY (ESWL);  Surgeon: Sherrilee Belvie CROME, MD;  Location: [facility];  Service: Urology;  Laterality: Right;   EXTRACORPOREAL SHOCK WAVE LITHOTRIPSY Left 07/11/2021   Procedure: EXTRACORPOREAL SHOCK WAVE LITHOTRIPSY (ESWL);  Surgeon: Roseann Adine PARAS., MD;  Location: [facility];  Service: Urology;  Laterality: Left;   INNER EAR SURGERY     as a child   SHOULDER SURGERY Left     Allergies  Allergen Reactions   Codeine Other (See Comments)    REACTION: Dizzy   Topamax  [Topiramate ] Other (See Comments)    Kidney stone/hematuria   Gabapentin  Other (See Comments)    Burning sensation in stomach     Physical Exam: Cast left intact today.  Capillary refill to the toes WNL.  There is no irritations or abrasions from the cast.  Radiographic Exam RT foot and ankle 01/06/2024:  Normal osseous mineralization. Joint spaces preserved.  No fractures or osseous irregularities noted.  Impression: Negative  MR ANKLE RIGHT WO CONTRAST 01/31/2024 IMPRESSION: Subacute nondisplaced fracture to the distal lateral malleolus with moderate marrow edema. Overlying  subcutaneous edema. Mild to moderate tendinopathy of the peroneal tendons and moderate tenosynovitis.  Assessment/Plan of Care: 1.  Subacute nondisplaced fracture distal fibula RT  -Patient evaluated.  -Injection of 0.5 cc Celestone  Soluspan injected into the tibiotalar joint right -Continue meloxicam  15 mg daily - The below-knee fiberglass cast was bivalved and removed today. -Okay to transition off of the knee scooter.  FWB good supportive tennis shoes -Order placed for physical therapy RT ankle at [facility] -Return to clinic 3 months     Thresa EMERSON Sar, DPM [facility]  Dr. Thresa EMERSON Sar, DPM    [address] [place], [place] [address]                Office [phone]  Fax [phone]

## 2024-05-04 ENCOUNTER — Other Ambulatory Visit: Payer: Self-pay

## 2024-05-04 ENCOUNTER — Encounter: Payer: Self-pay | Admitting: Physical Therapy

## 2024-05-04 ENCOUNTER — Ambulatory Visit: Admitting: Physical Therapy

## 2024-05-04 DIAGNOSIS — R2689 Other abnormalities of gait and mobility: Secondary | ICD-10-CM | POA: Insufficient documentation

## 2024-05-04 DIAGNOSIS — M25561 Pain in right knee: Secondary | ICD-10-CM | POA: Diagnosis present

## 2024-05-04 DIAGNOSIS — M25571 Pain in right ankle and joints of right foot: Secondary | ICD-10-CM | POA: Diagnosis present

## 2024-05-04 DIAGNOSIS — S82831D Other fracture of upper and lower end of right fibula, subsequent encounter for closed fracture with routine healing: Secondary | ICD-10-CM | POA: Insufficient documentation

## 2024-05-04 DIAGNOSIS — M6281 Muscle weakness (generalized): Secondary | ICD-10-CM | POA: Diagnosis present

## 2024-05-04 NOTE — Therapy (Signed)
 " OUTPATIENT PHYSICAL THERAPY LOWER EXTREMITY EVALUATION  Patient Name: Kenneth Gilbert MRN: [id number] DOB:1965/12/07, 58 y.o., male Today's Date: 05/04/2024   PT End of Session - 05/04/24 0823     Visit Number 1    Number of Visits --   1-2x/week   Date for Recertification  06/29/24    Authorization Type MCR    PT Start Time 0745    PT Stop Time 0829    PT Time Calculation (min) 44 min          Past Medical History:  Diagnosis Date   Allergy    Arthritis    Dizziness    ETOH abuse    Stopped drinking 4 yrs   Hard of hearing    Headache    Hyperlipidemia    Past Surgical History:  Procedure Laterality Date   arm surgery Right    arm surgery Left    COLONOSCOPY     EXTRACORPOREAL SHOCK WAVE LITHOTRIPSY Right 03/28/2021   Procedure: EXTRACORPOREAL SHOCK WAVE LITHOTRIPSY (ESWL);  Surgeon: Sherrilee Belvie CROME, MD;  Location: [facility];  Service: Urology;  Laterality: Right;   EXTRACORPOREAL SHOCK WAVE LITHOTRIPSY Left 07/11/2021   Procedure: EXTRACORPOREAL SHOCK WAVE LITHOTRIPSY (ESWL);  Surgeon: Roseann Adine PARAS., MD;  Location: [facility];  Service: Urology;  Laterality: Left;   INNER EAR SURGERY     as a child   SHOULDER SURGERY Left    Patient Active Problem List   Diagnosis Date Noted   Cubital tunnel syndrome of both upper extremities 04/05/2023   Olecranon bone spur 04/05/2023   Disorder of axillary nerve 12/27/2022   Lumbar facet arthropathy 10/09/2022   Right ankle pain 09/19/2022   Oropharyngeal dysphagia 08/15/2022   Pain in right shoulder 06/20/2022   Chronic midline low back pain without sciatica 03/15/2022   Status post spinal surgery 01/03/2022   Stenosis of left iliac artery 12/14/2021   Abdominal bruit 12/07/2021   Therapeutic opioid-induced constipation (OIC) 12/07/2021   Cervical spinal stenosis 06/22/2021   Ureteral calculus, right 03/21/2021   Benign prostatic hyperplasia with urinary hesitancy 03/07/2021   Biceps tendinitis of right upper  extremity 01/06/2021   Tendinitis of right rotator cuff 01/06/2021   LVH (left ventricular hypertrophy) 11/03/2019   Diastolic dysfunction without heart failure 11/03/2019   Neuromyelopathy due to vitamin B12 deficiency (HCC) 07/17/2018   Other chronic sinusitis 06/11/2018   Vertebral artery stenosis 12/11/2017   Sensorineural hearing loss (SNHL), bilateral 12/03/2017   Aortic atherosclerosis 07/26/2017   Aortic ectasia, abdominal 07/26/2017   Chronic vascular disorder of intestine 06/12/2017   Hyperlipidemia with target LDL less than 100 06/12/2017   Tobacco abuse 06/11/2017   Nephrolithiasis 02/20/2013   GERD 07/12/2009    PCP: Lazoff, Shawn P, DO  REFERRING PROVIDER: Janit Thresa HERO, DPM  THERAPY DIAG:  Pain in right ankle and joints of right foot - Plan: PT plan of care cert/re-cert  Right knee pain, unspecified chronicity - Plan: PT plan of care cert/re-cert  Muscle weakness - Plan: PT plan of care cert/re-cert  Other abnormalities of gait and mobility - Plan: PT plan of care cert/re-cert  REFERRING DIAG: Traumatic closed nondisplaced fracture of distal fibula, right, with routine healing, subsequent encounter [S82.831D]   Rationale for Evaluation and Treatment:  Rehabilitation  SUBJECTIVE:  PERTINENT PAST HISTORY:  Aortic atherosclerosis, chronic low back pain, biceps tendinitis, R shoulder pain, dizzy spells        PRECAUTIONS: see precautions  WEIGHT BEARING RESTRICTIONS No  FALLS:  Has patient fallen in last 6 months? No, Number of falls: I fall quite a bit  MOI/History of condition:  Onset date: September 2024  SUBJECTIVE STATEMENT  Pt is a 58 y.o. male who presents to clinic with chief complaint of R ankle pain, swelling, and weakness.  Fracture occurred in September.  Was in a boot starting in September but was moved to fiberglass at end of October.  Continues to have posterior lateral R ankle pain.    Pain:  Are you having pain? Yes Pain  location: R lateral ankle pain NPRS scale:  Best: 3/10, Worst: 6/10 Aggravating factors: walking, squatting Relieving factors: rest Pain description: sharp, burning, and aching  Occupation: NA  Assistive Device: NA  Hand Dominance: NA  Patient Goals/Specific Activities: reduce pain   OBJECTIVE:   GENERAL OBSERVATION/GAIT:  Antalgic gait with reduced time in stance on R with reduced DF in terminal stance/early swing  PALPATION: Diffuse TTP about R lateral malleolus   LE MMT:  MMT Right (Eval) Left (Eval)  Hip flexion (L2, L3) 3+* 4  Knee extension (L3) 3+* 4  Knee flexion    Hip abduction 3 3+  Hip extension    Hip external rotation    Hip internal rotation    Hip adduction    Ankle dorsiflexion (L4) 4* 4+  Ankle plantarflexion (S1) Unable to SL heel raise Partial ROM SLHR  Ankle inversion 3+* 4+  Ankle eversion 3+* 4+  Great Toe ext (L5)    Grossly     (Blank rows = not tested, score listed is out of 5 possible points OR may be listed in lbs of force.  N = WNL, D = diminished, C = clear for gross weakness with myotome testing, * = concordant pain with testing)  LE ROM:  ROM Right (Eval) Left (Eval)  Hip flexion    Hip extension    Hip abduction    Hip adduction    Hip internal rotation    Hip external rotation    Knee extension    Knee flexion    Ankle dorsiflexion 3* 2  Ankle plantarflexion    Ankle inversion 30* 40  Ankle eversion 10* 20   (Blank rows = not tested, N = WNL, * = concordant pain with testing)  Functional Tests  Eval    Progressive balance screen (highest level completed for >/= 10''):  Feet together: 10'' Semi Tandem: R in rear 10'' unstable, L in rear 10'' unstable Tandem: R in rear unable, L in rear unable                                                            PATIENT SURVEYS:  LEFS: 19/80  HOME EXERCISE PROGRAM: Access Code: [email or web address] URL: [email or web address] Date:  05/04/2024 Prepared by: Helene Gasmen  Exercises - Seated Toe Towel Scrunches  - 1 x daily - 7 x weekly - 3 sets - 10 reps - Ankle Inversion Eversion Towel Slide  - 1 x daily - 7 x weekly - 3 sets - 10 reps - Seated Ankle Plantarflexion with Resistance  - 1 x daily - 7 x weekly - 3 sets - 10 reps  Treatment priorities   Eval        General R LE/Hip/Ankle strengthening  Inv/eversion ROM        Balance and gait                         TODAY'S TREATMENT:  Therapeutic Exercise: Creating, reviewing, and completing HEP   PATIENT EDUCATION ([place]/[facility]):  POC, diagnosis, prognosis, HEP, and outcome measures.  Pt educated via explanation, demonstration, and handout (HEP).  Pt confirms understanding verbally.   ASSESSMENT:  CLINICAL IMPRESSION: Leopold is a 58 y.o. male who presents to clinic with signs and sxs consistent with R ankle pain and stiffness following non-displace lateral malleolar fracture.  Was in a boot or cast for about 3 months.   Generalized weakness and pain throughout R LQ.   Tejas will benefit from skilled PT to address relevant deficits and improve safety and comfort with daily activities.   OBJECTIVE IMPAIRMENTS: Pain, R ankle ROM, R ankle strength, R LE and hip strength   ACTIVITY LIMITATIONS: walking, standing, squatting, housework  PERSONAL FACTORS: See medical history and pertinent history   REHAB POTENTIAL: Good  CLINICAL DECISION MAKING: Evolving/moderate complexity  EVALUATION COMPLEXITY: Moderate   GOALS:   SHORT TERM GOALS: Target date: 06/01/2024   Ector will be >75% HEP compliant to improve carryover between sessions and facilitate independent management of condition  Evaluation: ongoing Goal status: INITIAL   LONG TERM GOALS: Target date: 06/29/2024   Marcellis will self report >/= 50% decrease in pain from evaluation to improve function in daily tasks  Evaluation/Baseline: 6/10 max pain Goal status: INITIAL   2.  Riker will show  a >/= 18 pt improvement in LEFS score (MCID is ~11% or 9 pts) as a proxy for functional improvement   Evaluation/Baseline: 19 pts Goal status: INITIAL   3.  Luisangel will be able to walk and stand for >30 min, not limited by pain  Evaluation/Baseline: limited Goal status: INITIAL   4.  Tannon will be able to stand for >30'' in tandem stance, to show a significant improvement in balance in order to reduce fall risk   Evaluation/Baseline: unable either side Goal status: INITIAL   5.  Marzell will demonstrate at most a mild antalgic gait  Evaluation/Baseline: Mod/severe antalgic gait Goal status: INITIAL    PLAN: PT FREQUENCY: 1-2x/week  PT DURATION: 8 weeks  PLANNED INTERVENTIONS:  97164- PT Re-evaluation, 97110-Therapeutic exercises, 97530- Therapeutic activity, [id number]- Neuromuscular re-education, 97535- Self Care, 02859- Manual therapy, [id number]- Gait training, [id number]- Aquatic Therapy, [id number]- Electrical stimulation (manual), [id number]- Vasopneumatic device, [id number]- Traction (mechanical), [id number]- Ionotophoresis 4mg /ml Dexamethasone , Taping, Dry Needling, Joint manipulation, and Spinal manipulation.   Helene BRAVO Tavaras Goody PT 05/04/2024, 8:34 AM   "

## 2024-05-12 ENCOUNTER — Ambulatory Visit: Admitting: Physical Therapy

## 2024-05-12 ENCOUNTER — Encounter: Payer: Self-pay | Admitting: Physical Therapy

## 2024-05-12 DIAGNOSIS — M25571 Pain in right ankle and joints of right foot: Secondary | ICD-10-CM | POA: Diagnosis not present

## 2024-05-12 NOTE — Therapy (Signed)
 " OUTPATIENT PHYSICAL THERAPY LOWER EXTREMITY TREATMENT  Patient Name: Kenneth Gilbert MRN: [id number] DOB:1965-11-13, 58 y.o., male Today's Date: 05/12/2024   PT End of Session - 05/12/24 0720     Visit Number 2    Number of Visits --   1-2x/week   Date for Recertification  06/29/24    Authorization Type MCR    PT Start Time 0715    PT Stop Time 0753    PT Time Calculation (min) 38 min          Past Medical History:  Diagnosis Date   Allergy    Arthritis    Dizziness    ETOH abuse    Stopped drinking 4 yrs   Hard of hearing    Headache    Hyperlipidemia    Past Surgical History:  Procedure Laterality Date   arm surgery Right    arm surgery Left    COLONOSCOPY     EXTRACORPOREAL SHOCK WAVE LITHOTRIPSY Right 03/28/2021   Procedure: EXTRACORPOREAL SHOCK WAVE LITHOTRIPSY (ESWL);  Surgeon: Sherrilee Belvie CROME, MD;  Location: [facility];  Service: Urology;  Laterality: Right;   EXTRACORPOREAL SHOCK WAVE LITHOTRIPSY Left 07/11/2021   Procedure: EXTRACORPOREAL SHOCK WAVE LITHOTRIPSY (ESWL);  Surgeon: Roseann Adine PARAS., MD;  Location: [facility];  Service: Urology;  Laterality: Left;   INNER EAR SURGERY     as a child   SHOULDER SURGERY Left    Patient Active Problem List   Diagnosis Date Noted   Cubital tunnel syndrome of both upper extremities 04/05/2023   Olecranon bone spur 04/05/2023   Disorder of axillary nerve 12/27/2022   Lumbar facet arthropathy 10/09/2022   Right ankle pain 09/19/2022   Oropharyngeal dysphagia 08/15/2022   Pain in right shoulder 06/20/2022   Chronic midline low back pain without sciatica 03/15/2022   Status post spinal surgery 01/03/2022   Stenosis of left iliac artery 12/14/2021   Abdominal bruit 12/07/2021   Therapeutic opioid-induced constipation (OIC) 12/07/2021   Cervical spinal stenosis 06/22/2021   Ureteral calculus, right 03/21/2021   Benign prostatic hyperplasia with urinary hesitancy 03/07/2021   Biceps tendinitis of right upper  extremity 01/06/2021   Tendinitis of right rotator cuff 01/06/2021   LVH (left ventricular hypertrophy) 11/03/2019   Diastolic dysfunction without heart failure 11/03/2019   Neuromyelopathy due to vitamin B12 deficiency (HCC) 07/17/2018   Other chronic sinusitis 06/11/2018   Vertebral artery stenosis 12/11/2017   Sensorineural hearing loss (SNHL), bilateral 12/03/2017   Aortic atherosclerosis 07/26/2017   Aortic ectasia, abdominal 07/26/2017   Chronic vascular disorder of intestine 06/12/2017   Hyperlipidemia with target LDL less than 100 06/12/2017   Tobacco abuse 06/11/2017   Nephrolithiasis 02/20/2013   GERD 07/12/2009    PCP: Lazoff, Shawn P, DO  REFERRING PROVIDER: Lazoff, Shawn P, DO  THERAPY DIAG:  Pain in right ankle and joints of right foot  REFERRING DIAG: Traumatic closed nondisplaced fracture of distal fibula, right, with routine healing, subsequent encounter [S82.831D]   Rationale for Evaluation and Treatment:  Rehabilitation  SUBJECTIVE:  PERTINENT PAST HISTORY:  Aortic atherosclerosis, chronic low back pain, biceps tendinitis, R shoulder pain, dizzy spells        PRECAUTIONS: see precautions  WEIGHT BEARING RESTRICTIONS No  FALLS:  Has patient fallen in last 6 months? No, Number of falls: I fall quite a bit  MOI/History of condition:  Onset date: September 2024  SUBJECTIVE STATEMENT The ankle still hurts but I am doing the exercises. The exercises feel like they  are doing something.    Pt is a 58 y.o. male who presents to clinic with chief complaint of R ankle pain, swelling, and weakness.  Fracture occurred in September.  Was in a boot starting in September but was moved to fiberglass at end of October.  Continues to have posterior lateral R ankle pain.    Pain:  Are you having pain? Yes Pain location: R lateral ankle pain NPRS scale: 4/10 Best: 3/10, Worst: 6/10 Aggravating factors: walking, squatting Relieving factors: rest Pain  description: sharp, burning, and aching  Occupation: NA  Assistive Device: NA  Hand Dominance: NA  Patient Goals/Specific Activities: reduce pain   OBJECTIVE:   GENERAL OBSERVATION/GAIT:  Antalgic gait with reduced time in stance on R with reduced DF in terminal stance/early swing  PALPATION: Diffuse TTP about R lateral malleolus   LE MMT:  MMT Right (Eval) Left (Eval)  Hip flexion (L2, L3) 3+* 4  Knee extension (L3) 3+* 4  Knee flexion    Hip abduction 3 3+  Hip extension    Hip external rotation    Hip internal rotation    Hip adduction    Ankle dorsiflexion (L4) 4* 4+  Ankle plantarflexion (S1) Unable to SL heel raise Partial ROM SLHR  Ankle inversion 3+* 4+  Ankle eversion 3+* 4+  Great Toe ext (L5)    Grossly     (Blank rows = not tested, score listed is out of 5 possible points OR may be listed in lbs of force.  N = WNL, D = diminished, C = clear for gross weakness with myotome testing, * = concordant pain with testing)  LE ROM:  ROM Right (Eval) Left (Eval)  Hip flexion    Hip extension    Hip abduction    Hip adduction    Hip internal rotation    Hip external rotation    Knee extension    Knee flexion    Ankle dorsiflexion 3* 2  Ankle plantarflexion    Ankle inversion 30* 40  Ankle eversion 10* 20   (Blank rows = not tested, N = WNL, * = concordant pain with testing)  Functional Tests  Eval    Progressive balance screen (highest level completed for >/= 10''):  Feet together: 10'' Semi Tandem: R in rear 10'' unstable, L in rear 10'' unstable Tandem: R in rear unable, L in rear unable                                                            PATIENT SURVEYS:  LEFS: 19/80  HOME EXERCISE PROGRAM: Access Code: [email or web address] URL: [email or web address] Date: 05/04/2024 Prepared by: Helene Gasmen  Exercises - Seated Toe Towel Scrunches  - 1 x daily - 7 x weekly - 3 sets - 10 reps - Ankle Inversion Eversion  Towel Slide  - 1 x daily - 7 x weekly - 3 sets - 10 reps - Seated Ankle Plantarflexion with Resistance  - 1 x daily - 7 x weekly - 3 sets - 10 reps Added 05/12/24 - Seated Ankle Eversion with Resistance  - 1 x daily - 7 x weekly - 2 sets - 10 reps - Seated Ankle Inversion with Anchored Resistance  - 1 x daily - 7 x weekly - 2 sets - 10 reps -  Seated Gastroc Stretch with Strap  - 1 x daily - 7 x weekly - 1 sets - 3 reps - 30 hold  Treatment priorities   Eval        General R LE/Hip/Ankle strengthening        Inv/eversion ROM        Balance and gait                         TODAY'S TREATMENT: [facility] Adult PT Treatment:                                                DATE: 05/12/24 Therapeutic Exercise/ neuro re-ed Ankle circles CW/ CCW Seated PF with blue band - cues for slow eccentric return  Seated towel INV /EV- mod cues for correct technique Seated toe scrunches Seated heel raise Seated toe raise Standing heel raise x 10- increased pain Standing gastroc stretch- increased pain Seated gastroc stretch with towel 3 x 30 sec  Seated inversion and eversion YTB x 10 each  Updated HEP    EVAL: Therapeutic Exercise: Creating, reviewing, and completing HEP   PATIENT EDUCATION ([place]/[facility]):  POC, diagnosis, prognosis, HEP, and outcome measures.  Pt educated via explanation, demonstration, and handout (HEP).  Pt confirms understanding verbally.   ASSESSMENT:  CLINICAL IMPRESSION: Pt reports compliance with HEP and continued pain. He did require mod cues for correct HEP technique. Continued with open chain additions to HEP due to increased pain with closed chain trials. He did well with seated theraband technique and reported minimal increased pain.    Levander is a 58 y.o. male who presents to clinic with signs and sxs consistent with R ankle pain and stiffness following non-displace lateral malleolar fracture.  Was in a boot or cast for about 3 months.   Generalized weakness and pain  throughout R LQ.   Ray will benefit from skilled PT to address relevant deficits and improve safety and comfort with daily activities.   OBJECTIVE IMPAIRMENTS: Pain, R ankle ROM, R ankle strength, R LE and hip strength   ACTIVITY LIMITATIONS: walking, standing, squatting, housework  PERSONAL FACTORS: See medical history and pertinent history   REHAB POTENTIAL: Good  CLINICAL DECISION MAKING: Evolving/moderate complexity  EVALUATION COMPLEXITY: Moderate   GOALS:   SHORT TERM GOALS: Target date: 06/01/2024   Giavanni will be >75% HEP compliant to improve carryover between sessions and facilitate independent management of condition  Evaluation: ongoing Goal status: INITIAL   LONG TERM GOALS: Target date: 06/29/2024   Meade will self report >/= 50% decrease in pain from evaluation to improve function in daily tasks  Evaluation/Baseline: 6/10 max pain Goal status: INITIAL   2.  Thinh will show a >/= 18 pt improvement in LEFS score (MCID is ~11% or 9 pts) as a proxy for functional improvement   Evaluation/Baseline: 19 pts Goal status: INITIAL   3.  Nikolai will be able to walk and stand for >30 min, not limited by pain  Evaluation/Baseline: limited Goal status: INITIAL   4.  Jabbar will be able to stand for >30'' in tandem stance, to show a significant improvement in balance in order to reduce fall risk   Evaluation/Baseline: unable either side Goal status: INITIAL   5.  Rowin will demonstrate at most a mild antalgic gait  Evaluation/Baseline: Mod/severe antalgic  gait Goal status: INITIAL    PLAN: PT FREQUENCY: 1-2x/week  PT DURATION: 8 weeks  PLANNED INTERVENTIONS:  97164- PT Re-evaluation, 97110-Therapeutic exercises, 97530- Therapeutic activity, [id number]- Neuromuscular re-education, 97535- Self Care, 02859- Manual therapy, [id number]- Gait training, [id number]- Aquatic Therapy, [phone]- Electrical stimulation (manual), [id number]- Vasopneumatic device, [id number]- Traction  (mechanical), [id number]- Ionotophoresis 4mg /ml Dexamethasone , Taping, Dry Needling, Joint manipulation, and Spinal manipulation.   Harlene HERO Christain Niznik PTA 05/12/2024, 7:57 AM   "

## 2024-05-19 ENCOUNTER — Ambulatory Visit: Payer: Medicare (Managed Care) | Attending: Podiatry | Admitting: Physical Therapy

## 2024-05-19 ENCOUNTER — Encounter: Payer: Self-pay | Admitting: Physical Therapy

## 2024-05-19 DIAGNOSIS — M25561 Pain in right knee: Secondary | ICD-10-CM | POA: Insufficient documentation

## 2024-05-19 DIAGNOSIS — M6281 Muscle weakness (generalized): Secondary | ICD-10-CM | POA: Insufficient documentation

## 2024-05-19 DIAGNOSIS — M25571 Pain in right ankle and joints of right foot: Secondary | ICD-10-CM | POA: Diagnosis present

## 2024-05-19 NOTE — Therapy (Signed)
 " OUTPATIENT PHYSICAL THERAPY LOWER EXTREMITY TREATMENT  Patient Name: Kenneth Gilbert MRN: [id number] DOB:1965-08-12, 59 y.o., male Today's Date: 05/19/2024   PT End of Session - 05/19/24 0747     Visit Number 3    Number of Visits --   1-2x/week   Date for Recertification  06/29/24    Authorization Type MCR    PT Start Time 0750          Past Medical History:  Diagnosis Date   Allergy    Arthritis    Dizziness    ETOH abuse    Stopped drinking 4 yrs   Hard of hearing    Headache    Hyperlipidemia    Past Surgical History:  Procedure Laterality Date   arm surgery Right    arm surgery Left    COLONOSCOPY     EXTRACORPOREAL SHOCK WAVE LITHOTRIPSY Right 03/28/2021   Procedure: EXTRACORPOREAL SHOCK WAVE LITHOTRIPSY (ESWL);  Surgeon: Sherrilee Belvie CROME, MD;  Location: [facility];  Service: Urology;  Laterality: Right;   EXTRACORPOREAL SHOCK WAVE LITHOTRIPSY Left 07/11/2021   Procedure: EXTRACORPOREAL SHOCK WAVE LITHOTRIPSY (ESWL);  Surgeon: Roseann Adine PARAS., MD;  Location: [facility];  Service: Urology;  Laterality: Left;   INNER EAR SURGERY     as a child   SHOULDER SURGERY Left    Patient Active Problem List   Diagnosis Date Noted   Cubital tunnel syndrome of both upper extremities 04/05/2023   Olecranon bone spur 04/05/2023   Disorder of axillary nerve 12/27/2022   Lumbar facet arthropathy 10/09/2022   Right ankle pain 09/19/2022   Oropharyngeal dysphagia 08/15/2022   Pain in right shoulder 06/20/2022   Chronic midline low back pain without sciatica 03/15/2022   Status post spinal surgery 01/03/2022   Stenosis of left iliac artery 12/14/2021   Abdominal bruit 12/07/2021   Therapeutic opioid-induced constipation (OIC) 12/07/2021   Cervical spinal stenosis 06/22/2021   Ureteral calculus, right 03/21/2021   Benign prostatic hyperplasia with urinary hesitancy 03/07/2021   Biceps tendinitis of right upper extremity 01/06/2021   Tendinitis of right rotator cuff  01/06/2021   LVH (left ventricular hypertrophy) 11/03/2019   Diastolic dysfunction without heart failure 11/03/2019   Neuromyelopathy due to vitamin B12 deficiency (HCC) 07/17/2018   Other chronic sinusitis 06/11/2018   Vertebral artery stenosis 12/11/2017   Sensorineural hearing loss (SNHL), bilateral 12/03/2017   Aortic atherosclerosis 07/26/2017   Aortic ectasia, abdominal 07/26/2017   Chronic vascular disorder of intestine 06/12/2017   Hyperlipidemia with target LDL less than 100 06/12/2017   Tobacco abuse 06/11/2017   Nephrolithiasis 02/20/2013   GERD 07/12/2009    PCP: Lazoff, Shawn P, DO  REFERRING PROVIDER: Lazoff, Shawn P, DO  THERAPY DIAG:  Pain in right ankle and joints of right foot  Right knee pain, unspecified chronicity  Muscle weakness  REFERRING DIAG: Traumatic closed nondisplaced fracture of distal fibula, right, with routine healing, subsequent encounter [S82.831D]   Rationale for Evaluation and Treatment:  Rehabilitation  SUBJECTIVE:  PERTINENT PAST HISTORY:  Aortic atherosclerosis, chronic low back pain, biceps tendinitis, R shoulder pain, dizzy spells        PRECAUTIONS: see precautions  WEIGHT BEARING RESTRICTIONS No  FALLS:  Has patient fallen in last 6 months? No, Number of falls: I fall quite a bit  MOI/History of condition:  Onset date: September 2024  SUBJECTIVE STATEMENT Pt reports that he put his L knee on his bed and heard a pop and now has severe L knee pain.  Pt reports that his L ankle is feeling much better.   Pt is a 59 y.o. male who presents to clinic with chief complaint of R ankle pain, swelling, and weakness.  Fracture occurred in September.  Was in a boot starting in September but was moved to fiberglass at end of October.  Continues to have posterior lateral R ankle pain.    Pain:  Are you having pain? Yes Pain location: R lateral ankle pain NPRS scale: 4/10 Best: 3/10, Worst: 6/10 Aggravating factors: walking,  squatting Relieving factors: rest Pain description: sharp, burning, and aching  Occupation: NA  Assistive Device: NA  Hand Dominance: NA  Patient Goals/Specific Activities: reduce pain   OBJECTIVE:   GENERAL OBSERVATION/GAIT:  Antalgic gait with reduced time in stance on R with reduced DF in terminal stance/early swing  PALPATION: Diffuse TTP about R lateral malleolus   LE MMT:  MMT Right (Eval) Left (Eval)  Hip flexion (L2, L3) 3+* 4  Knee extension (L3) 3+* 4  Knee flexion    Hip abduction 3 3+  Hip extension    Hip external rotation    Hip internal rotation    Hip adduction    Ankle dorsiflexion (L4) 4* 4+  Ankle plantarflexion (S1) Unable to SL heel raise Partial ROM SLHR  Ankle inversion 3+* 4+  Ankle eversion 3+* 4+  Great Toe ext (L5)    Grossly     (Blank rows = not tested, score listed is out of 5 possible points OR may be listed in lbs of force.  N = WNL, D = diminished, C = clear for gross weakness with myotome testing, * = concordant pain with testing)  LE ROM:  ROM Right (Eval) Left (Eval)  Hip flexion    Hip extension    Hip abduction    Hip adduction    Hip internal rotation    Hip external rotation    Knee extension    Knee flexion    Ankle dorsiflexion 3* 2  Ankle plantarflexion    Ankle inversion 30* 40  Ankle eversion 10* 20   (Blank rows = not tested, N = WNL, * = concordant pain with testing)  Functional Tests  Eval    Progressive balance screen (highest level completed for >/= 10''):  Feet together: 10'' Semi Tandem: R in rear 10'' unstable, L in rear 10'' unstable Tandem: R in rear unable, L in rear unable                                                            PATIENT SURVEYS:  LEFS: 19/80  HOME EXERCISE PROGRAM: Access Code: [email or web address] URL: [email or web address] Date: 05/04/2024 Prepared by: Helene Gasmen  Exercises - Seated Toe Towel Scrunches  - 1 x daily - 7 x weekly - 3  sets - 10 reps - Ankle Inversion Eversion Towel Slide  - 1 x daily - 7 x weekly - 3 sets - 10 reps - Seated Ankle Plantarflexion with Resistance  - 1 x daily - 7 x weekly - 3 sets - 10 reps Added 05/12/24 - Seated Ankle Eversion with Resistance  - 1 x daily - 7 x weekly - 2 sets - 10 reps - Seated Ankle Inversion with Anchored Resistance  - 1 x daily - 7 x weekly -  2 sets - 10 reps - Seated Gastroc Stretch with Strap  - 1 x daily - 7 x weekly - 1 sets - 3 reps - 30 hold  Treatment priorities   Eval        General R LE/Hip/Ankle strengthening        Inv/eversion ROM        Balance and gait                         TODAY'S TREATMENT: [facility] Adult PT Treatment:                                                DATE: 05/20/23 Therapeutic Exercise Ankle circles CW/ CCW Seated PF with blue band 2x8 Inv/ev YTB then RTB - 2x4 Seated heel raise - 15# - 2x4x6s BAPS board L3 - all directions   ASSESSMENT:  CLINICAL IMPRESSION:  R ankle is doing well overall.  Able to move through available range more comfortably now.  Progressed to RTB for Inv/Ev slides at home.  L knee is significantly painful today and causing significant gait deviations.   Levander is a 59 y.o. male who presents to clinic with signs and sxs consistent with R ankle pain and stiffness following non-displace lateral malleolar fracture.  Was in a boot or cast for about 3 months.   Generalized weakness and pain throughout R LQ.   Ray will benefit from skilled PT to address relevant deficits and improve safety and comfort with daily activities.   OBJECTIVE IMPAIRMENTS: Pain, R ankle ROM, R ankle strength, R LE and hip strength   ACTIVITY LIMITATIONS: walking, standing, squatting, housework  PERSONAL FACTORS: See medical history and pertinent history   REHAB POTENTIAL: Good  CLINICAL DECISION MAKING: Evolving/moderate complexity  EVALUATION COMPLEXITY: Moderate   GOALS:   SHORT TERM GOALS: Target date: 06/01/2024   Harrison  will be >75% HEP compliant to improve carryover between sessions and facilitate independent management of condition  Evaluation: ongoing Goal status: INITIAL   LONG TERM GOALS: Target date: 06/29/2024   Esther will self report >/= 50% decrease in pain from evaluation to improve function in daily tasks  Evaluation/Baseline: 6/10 max pain Goal status: INITIAL   2.  Yoav will show a >/= 18 pt improvement in LEFS score (MCID is ~11% or 9 pts) as a proxy for functional improvement   Evaluation/Baseline: 19 pts Goal status: INITIAL   3.  Michail will be able to walk and stand for >30 min, not limited by pain  Evaluation/Baseline: limited Goal status: INITIAL   4.  Kit will be able to stand for >30'' in tandem stance, to show a significant improvement in balance in order to reduce fall risk   Evaluation/Baseline: unable either side Goal status: INITIAL   5.  Nils will demonstrate at most a mild antalgic gait  Evaluation/Baseline: Mod/severe antalgic gait Goal status: INITIAL    PLAN: PT FREQUENCY: 1-2x/week  PT DURATION: 8 weeks  PLANNED INTERVENTIONS:  97164- PT Re-evaluation, 97110-Therapeutic exercises, 97530- Therapeutic activity, [id number]- Neuromuscular re-education, 97535- Self Care, [id number]- Manual therapy, [id number]- Gait training, [id number]- Aquatic Therapy, [phone]- Electrical stimulation (manual), [id number]- Vasopneumatic device, [id number]- Traction (mechanical), [id number]- Ionotophoresis 4mg /ml Dexamethasone , Taping, Dry Needling, Joint manipulation, and Spinal manipulation.   Antasia Haider E Sinan Tuch PT 05/19/2024, 8:43 AM   "

## 2024-05-26 ENCOUNTER — Encounter: Payer: Self-pay | Admitting: Physical Therapy

## 2024-05-26 ENCOUNTER — Ambulatory Visit: Payer: Medicare (Managed Care) | Admitting: Physical Therapy

## 2024-05-26 DIAGNOSIS — M25571 Pain in right ankle and joints of right foot: Secondary | ICD-10-CM

## 2024-05-26 DIAGNOSIS — M25561 Pain in right knee: Secondary | ICD-10-CM

## 2024-05-26 NOTE — Therapy (Signed)
 " OUTPATIENT PHYSICAL THERAPY LOWER EXTREMITY TREATMENT  Patient Name: Kenneth Gilbert MRN: [id number] DOB:21-Dec-1965, 59 y.o., male Today's Date: 05/26/2024   PT End of Session - 05/26/24 0800     Visit Number 4    Number of Visits --   1-2x/week   Date for Recertification  06/29/24    Authorization Type MCR    PT Start Time 0800    PT Stop Time 0841    PT Time Calculation (min) 41 min          Past Medical History:  Diagnosis Date   Allergy    Arthritis    Dizziness    ETOH abuse    Stopped drinking 4 yrs   Hard of hearing    Headache    Hyperlipidemia    Past Surgical History:  Procedure Laterality Date   arm surgery Right    arm surgery Left    COLONOSCOPY     EXTRACORPOREAL SHOCK WAVE LITHOTRIPSY Right 03/28/2021   Procedure: EXTRACORPOREAL SHOCK WAVE LITHOTRIPSY (ESWL);  Surgeon: Sherrilee Belvie CROME, MD;  Location: [facility];  Service: Urology;  Laterality: Right;   EXTRACORPOREAL SHOCK WAVE LITHOTRIPSY Left 07/11/2021   Procedure: EXTRACORPOREAL SHOCK WAVE LITHOTRIPSY (ESWL);  Surgeon: Roseann Adine PARAS., MD;  Location: [facility];  Service: Urology;  Laterality: Left;   INNER EAR SURGERY     as a child   SHOULDER SURGERY Left    Patient Active Problem List   Diagnosis Date Noted   Cubital tunnel syndrome of both upper extremities 04/05/2023   Olecranon bone spur 04/05/2023   Disorder of axillary nerve 12/27/2022   Lumbar facet arthropathy 10/09/2022   Right ankle pain 09/19/2022   Oropharyngeal dysphagia 08/15/2022   Pain in right shoulder 06/20/2022   Chronic midline low back pain without sciatica 03/15/2022   Status post spinal surgery 01/03/2022   Stenosis of left iliac artery 12/14/2021   Abdominal bruit 12/07/2021   Therapeutic opioid-induced constipation (OIC) 12/07/2021   Cervical spinal stenosis 06/22/2021   Ureteral calculus, right 03/21/2021   Benign prostatic hyperplasia with urinary hesitancy 03/07/2021   Biceps tendinitis of right upper  extremity 01/06/2021   Tendinitis of right rotator cuff 01/06/2021   LVH (left ventricular hypertrophy) 11/03/2019   Diastolic dysfunction without heart failure 11/03/2019   Neuromyelopathy due to vitamin B12 deficiency (HCC) 07/17/2018   Other chronic sinusitis 06/11/2018   Vertebral artery stenosis 12/11/2017   Sensorineural hearing loss (SNHL), bilateral 12/03/2017   Aortic atherosclerosis 07/26/2017   Aortic ectasia, abdominal 07/26/2017   Chronic vascular disorder of intestine 06/12/2017   Hyperlipidemia with target LDL less than 100 06/12/2017   Tobacco abuse 06/11/2017   Nephrolithiasis 02/20/2013   GERD 07/12/2009    PCP: Lazoff, Shawn P, DO  REFERRING PROVIDER: Janit Thresa HERO, DPM  THERAPY DIAG:  Pain in right ankle and joints of right foot  Right knee pain, unspecified chronicity  REFERRING DIAG: Traumatic closed nondisplaced fracture of distal fibula, right, with routine healing, subsequent encounter [S82.831D]   Rationale for Evaluation and Treatment:  Rehabilitation  SUBJECTIVE:  PERTINENT PAST HISTORY:  Aortic atherosclerosis, chronic low back pain, biceps tendinitis, R shoulder pain, dizzy spells        PRECAUTIONS: see precautions  WEIGHT BEARING RESTRICTIONS No  FALLS:  Has patient fallen in last 6 months? No, Number of falls: I fall quite a bit  MOI/History of condition:  Onset date: September 2024  SUBJECTIVE STATEMENT Pt reports that his knee is doing better.  Reports higher R ankle pain.  Feels this may be d/t using there red band.  Has been doing HEP 3x/day.   Pt is a 59 y.o. male who presents to clinic with chief complaint of R ankle pain, swelling, and weakness.  Fracture occurred in September.  Was in a boot starting in September but was moved to fiberglass at end of October.  Continues to have posterior lateral R ankle pain.    Pain:  Are you having pain? Yes Pain location: R lateral ankle pain NPRS scale: 4/10 Best: 3/10, Worst:  6/10 Aggravating factors: walking, squatting Relieving factors: rest Pain description: sharp, burning, and aching  Occupation: NA  Assistive Device: NA  Hand Dominance: NA  Patient Goals/Specific Activities: reduce pain   OBJECTIVE:   GENERAL OBSERVATION/GAIT:  Antalgic gait with reduced time in stance on R with reduced DF in terminal stance/early swing  PALPATION: Diffuse TTP about R lateral malleolus   LE MMT:  MMT Right (Eval) Left (Eval)  Hip flexion (L2, L3) 3+* 4  Knee extension (L3) 3+* 4  Knee flexion    Hip abduction 3 3+  Hip extension    Hip external rotation    Hip internal rotation    Hip adduction    Ankle dorsiflexion (L4) 4* 4+  Ankle plantarflexion (S1) Unable to SL heel raise Partial ROM SLHR  Ankle inversion 3+* 4+  Ankle eversion 3+* 4+  Great Toe ext (L5)    Grossly     (Blank rows = not tested, score listed is out of 5 possible points OR may be listed in lbs of force.  N = WNL, D = diminished, C = clear for gross weakness with myotome testing, * = concordant pain with testing)  LE ROM:  ROM Right (Eval) Left (Eval)  Hip flexion    Hip extension    Hip abduction    Hip adduction    Hip internal rotation    Hip external rotation    Knee extension    Knee flexion    Ankle dorsiflexion 3* 2  Ankle plantarflexion    Ankle inversion 30* 40  Ankle eversion 10* 20   (Blank rows = not tested, N = WNL, * = concordant pain with testing)  Functional Tests  Eval    Progressive balance screen (highest level completed for >/= 10''):  Feet together: 10'' Semi Tandem: R in rear 10'' unstable, L in rear 10'' unstable Tandem: R in rear unable, L in rear unable                                                            PATIENT SURVEYS:  LEFS: 19/80  HOME EXERCISE PROGRAM: Access Code: [email or web address] URL: [email or web address] Date: 05/04/2024 Prepared by: Helene Gasmen  Exercises - Seated Toe Towel  Scrunches  - 1 x daily - 7 x weekly - 3 sets - 10 reps - Ankle Inversion Eversion Towel Slide  - 1 x daily - 7 x weekly - 3 sets - 10 reps - Seated Ankle Plantarflexion with Resistance  - 1 x daily - 7 x weekly - 3 sets - 10 reps Added 05/12/24 - Seated Ankle Eversion with Resistance  - 1 x daily - 7 x weekly - 2 sets - 10 reps - Seated Ankle Inversion with  Anchored Resistance  - 1 x daily - 7 x weekly - 2 sets - 10 reps - Seated Gastroc Stretch with Strap  - 1 x daily - 7 x weekly - 1 sets - 3 reps - 30 hold  Treatment priorities   Eval        General R LE/Hip/Ankle strengthening        Inv/eversion ROM        Balance and gait                         TODAY'S TREATMENT: [facility] Adult PT Treatment:                                                DATE: 05/27/23 Therapeutic Exercise Bike - 4 min Trial of ridged taping of R ankle to see if ASO may be helpful Pt education on load management, reevaluation of HEP, instructions on how to progress loading   ASSESSMENT:  CLINICAL IMPRESSION:  Pt has had flare of R ankle pain since last visit.  Feels this may be d/t moving to RTB.  Significant antalgic gait when entering clinic today.  Trialed ridged taping for R ankle which pt report has minimal impact on pain but does improve wb tolerance on R.  Discussed load management and goals of HEP.  Regressed to YTB and reduced frequency of HEP to 1x/day until pain reduces.  Discussed removing tape with any increased pain or loss of sensation in foot or toes.  Will consider ASO if taping his helpful for pain.  Levander is a 59 y.o. male who presents to clinic with signs and sxs consistent with R ankle pain and stiffness following non-displace lateral malleolar fracture.  Was in a boot or cast for about 3 months.   Generalized weakness and pain throughout R LQ.   Ray will benefit from skilled PT to address relevant deficits and improve safety and comfort with daily activities.   OBJECTIVE IMPAIRMENTS: Pain, R  ankle ROM, R ankle strength, R LE and hip strength   ACTIVITY LIMITATIONS: walking, standing, squatting, housework  PERSONAL FACTORS: See medical history and pertinent history   REHAB POTENTIAL: Good  CLINICAL DECISION MAKING: Evolving/moderate complexity  EVALUATION COMPLEXITY: Moderate   GOALS:   SHORT TERM GOALS: Target date: 06/01/2024   Bryen will be >75% HEP compliant to improve carryover between sessions and facilitate independent management of condition  Evaluation: ongoing Goal status: MET   LONG TERM GOALS: Target date: 06/29/2024   Dandra will self report >/= 50% decrease in pain from evaluation to improve function in daily tasks  Evaluation/Baseline: 6/10 max pain Goal status: INITIAL   2.  Maverik will show a >/= 18 pt improvement in LEFS score (MCID is ~11% or 9 pts) as a proxy for functional improvement   Evaluation/Baseline: 19 pts Goal status: INITIAL   3.  Jashon will be able to walk and stand for >30 min, not limited by pain  Evaluation/Baseline: limited Goal status: INITIAL   4.  Ellard will be able to stand for >30'' in tandem stance, to show a significant improvement in balance in order to reduce fall risk   Evaluation/Baseline: unable either side Goal status: INITIAL   5.  Paeton will demonstrate at most a mild antalgic gait  Evaluation/Baseline: Mod/severe antalgic gait Goal status: INITIAL  PLAN: PT FREQUENCY: 1-2x/week  PT DURATION: 8 weeks  PLANNED INTERVENTIONS:  97164- PT Re-evaluation, 97110-Therapeutic exercises, 97530- Therapeutic activity, [id number]- Neuromuscular re-education, 97535- Self Care, 02859- Manual therapy, [id number]- Gait training, [id number]- Aquatic Therapy, [phone]- Electrical stimulation (manual), [id number]- Vasopneumatic device, [id number]- Traction (mechanical), [id number]- Ionotophoresis 4mg /ml Dexamethasone , Taping, Dry Needling, Joint manipulation, and Spinal manipulation.   Caila Cirelli E Kerry Odonohue PT 05/26/2024, 8:42 AM   "

## 2024-06-02 ENCOUNTER — Ambulatory Visit: Payer: Medicare (Managed Care) | Admitting: Physical Therapy
